# Patient Record
Sex: Female | Born: 1950
Health system: Southern US, Community
[De-identification: ages and names within clinical notes are randomized; demographics above are authoritative.]

## PROBLEM LIST (undated history)

## (undated) DIAGNOSIS — J40 Bronchitis, not specified as acute or chronic: Secondary | ICD-10-CM

## (undated) DIAGNOSIS — E785 Hyperlipidemia, unspecified: Secondary | ICD-10-CM

## (undated) DIAGNOSIS — N189 Chronic kidney disease, unspecified: Secondary | ICD-10-CM

## (undated) DIAGNOSIS — I10 Essential (primary) hypertension: Secondary | ICD-10-CM

## (undated) DIAGNOSIS — C801 Malignant (primary) neoplasm, unspecified: Secondary | ICD-10-CM

## (undated) HISTORY — PX: ABDOMINAL HYSTERECTOMY: SHX81

## (undated) HISTORY — DX: Hyperlipidemia, unspecified: E78.5

## (undated) HISTORY — DX: Essential (primary) hypertension: I10

## (undated) HISTORY — DX: Malignant (primary) neoplasm, unspecified: C80.1

## (undated) HISTORY — DX: Chronic kidney disease, unspecified: N18.9

---

## 1997-10-19 ENCOUNTER — Encounter (INDEPENDENT_AMBULATORY_CARE_PROVIDER_SITE_OTHER): Payer: Self-pay | Admitting: *Deleted

## 1997-10-19 LAB — CONVERTED CEMR LAB

## 1997-10-31 LAB — CONVERTED CEMR LAB: Pap Smear: NORMAL

## 1998-09-03 ENCOUNTER — Encounter: Admission: RE | Admit: 1998-09-03 | Discharge: 1998-09-03 | Payer: Self-pay | Admitting: Sports Medicine

## 1998-09-11 ENCOUNTER — Encounter: Admission: RE | Admit: 1998-09-11 | Discharge: 1998-09-11 | Payer: Self-pay | Admitting: Family Medicine

## 1998-12-24 ENCOUNTER — Encounter: Admission: RE | Admit: 1998-12-24 | Discharge: 1998-12-24 | Payer: Self-pay | Admitting: Sports Medicine

## 1999-02-05 ENCOUNTER — Encounter: Admission: RE | Admit: 1999-02-05 | Discharge: 1999-02-05 | Payer: Self-pay | Admitting: Family Medicine

## 1999-03-07 ENCOUNTER — Encounter: Admission: RE | Admit: 1999-03-07 | Discharge: 1999-03-07 | Payer: Self-pay | Admitting: Family Medicine

## 1999-04-08 ENCOUNTER — Encounter: Admission: RE | Admit: 1999-04-08 | Discharge: 1999-04-08 | Payer: Self-pay | Admitting: Family Medicine

## 1999-05-06 ENCOUNTER — Encounter: Admission: RE | Admit: 1999-05-06 | Discharge: 1999-05-06 | Payer: Self-pay | Admitting: Family Medicine

## 1999-07-03 ENCOUNTER — Encounter: Admission: RE | Admit: 1999-07-03 | Discharge: 1999-07-03 | Payer: Self-pay | Admitting: Family Medicine

## 1999-07-09 ENCOUNTER — Encounter: Admission: RE | Admit: 1999-07-09 | Discharge: 1999-10-07 | Payer: Self-pay | Admitting: *Deleted

## 1999-11-10 ENCOUNTER — Encounter: Admission: RE | Admit: 1999-11-10 | Discharge: 1999-11-10 | Payer: Self-pay | Admitting: Family Medicine

## 1999-11-29 ENCOUNTER — Emergency Department (HOSPITAL_COMMUNITY): Admission: EM | Admit: 1999-11-29 | Discharge: 1999-11-29 | Payer: Self-pay | Admitting: Emergency Medicine

## 2000-01-16 ENCOUNTER — Encounter: Admission: RE | Admit: 2000-01-16 | Discharge: 2000-01-16 | Payer: Self-pay | Admitting: Sports Medicine

## 2000-04-07 ENCOUNTER — Encounter: Admission: RE | Admit: 2000-04-07 | Discharge: 2000-04-07 | Payer: Self-pay | Admitting: Family Medicine

## 2000-07-27 ENCOUNTER — Encounter: Admission: RE | Admit: 2000-07-27 | Discharge: 2000-07-27 | Payer: Self-pay | Admitting: Family Medicine

## 2000-12-29 ENCOUNTER — Encounter: Admission: RE | Admit: 2000-12-29 | Discharge: 2000-12-29 | Payer: Self-pay | Admitting: Family Medicine

## 2001-01-27 ENCOUNTER — Encounter: Admission: RE | Admit: 2001-01-27 | Discharge: 2001-01-27 | Payer: Self-pay | Admitting: Family Medicine

## 2001-02-03 ENCOUNTER — Encounter: Admission: RE | Admit: 2001-02-03 | Discharge: 2001-02-03 | Payer: Self-pay | Admitting: Family Medicine

## 2001-03-14 ENCOUNTER — Encounter: Admission: RE | Admit: 2001-03-14 | Discharge: 2001-03-14 | Payer: Self-pay | Admitting: Family Medicine

## 2001-03-17 ENCOUNTER — Encounter: Admission: RE | Admit: 2001-03-17 | Discharge: 2001-03-17 | Payer: Self-pay | Admitting: Family Medicine

## 2001-04-08 ENCOUNTER — Encounter: Admission: RE | Admit: 2001-04-08 | Discharge: 2001-04-08 | Payer: Self-pay | Admitting: Family Medicine

## 2001-06-24 ENCOUNTER — Emergency Department (HOSPITAL_COMMUNITY): Admission: EM | Admit: 2001-06-24 | Discharge: 2001-06-24 | Payer: Self-pay | Admitting: Emergency Medicine

## 2001-07-22 ENCOUNTER — Encounter: Admission: RE | Admit: 2001-07-22 | Discharge: 2001-07-22 | Payer: Self-pay | Admitting: Family Medicine

## 2002-02-23 ENCOUNTER — Encounter: Admission: RE | Admit: 2002-02-23 | Discharge: 2002-02-23 | Payer: Self-pay | Admitting: Family Medicine

## 2002-02-28 ENCOUNTER — Emergency Department (HOSPITAL_COMMUNITY): Admission: EM | Admit: 2002-02-28 | Discharge: 2002-02-28 | Payer: Self-pay | Admitting: *Deleted

## 2002-02-28 ENCOUNTER — Encounter: Payer: Self-pay | Admitting: Emergency Medicine

## 2002-03-16 ENCOUNTER — Encounter: Admission: RE | Admit: 2002-03-16 | Discharge: 2002-03-16 | Payer: Self-pay | Admitting: Family Medicine

## 2002-06-27 ENCOUNTER — Encounter: Admission: RE | Admit: 2002-06-27 | Discharge: 2002-06-27 | Payer: Self-pay | Admitting: Family Medicine

## 2002-09-26 ENCOUNTER — Encounter: Admission: RE | Admit: 2002-09-26 | Discharge: 2002-09-26 | Payer: Self-pay | Admitting: Family Medicine

## 2002-09-29 ENCOUNTER — Emergency Department (HOSPITAL_COMMUNITY): Admission: EM | Admit: 2002-09-29 | Discharge: 2002-09-29 | Payer: Self-pay | Admitting: Emergency Medicine

## 2002-09-29 ENCOUNTER — Encounter: Payer: Self-pay | Admitting: Emergency Medicine

## 2002-11-02 ENCOUNTER — Encounter: Admission: RE | Admit: 2002-11-02 | Discharge: 2002-11-02 | Payer: Self-pay | Admitting: Family Medicine

## 2002-11-09 ENCOUNTER — Encounter: Admission: RE | Admit: 2002-11-09 | Discharge: 2002-11-09 | Payer: Self-pay | Admitting: Family Medicine

## 2003-02-12 ENCOUNTER — Encounter: Admission: RE | Admit: 2003-02-12 | Discharge: 2003-02-12 | Payer: Self-pay | Admitting: Family Medicine

## 2003-06-22 ENCOUNTER — Encounter: Admission: RE | Admit: 2003-06-22 | Discharge: 2003-06-22 | Payer: Self-pay | Admitting: Family Medicine

## 2003-07-18 ENCOUNTER — Encounter: Admission: RE | Admit: 2003-07-18 | Discharge: 2003-07-18 | Payer: Self-pay | Admitting: Sports Medicine

## 2003-07-18 ENCOUNTER — Encounter: Payer: Self-pay | Admitting: Sports Medicine

## 2004-01-15 ENCOUNTER — Encounter: Admission: RE | Admit: 2004-01-15 | Discharge: 2004-01-15 | Payer: Self-pay | Admitting: Family Medicine

## 2004-04-14 ENCOUNTER — Encounter: Admission: RE | Admit: 2004-04-14 | Discharge: 2004-04-14 | Payer: Self-pay | Admitting: Sports Medicine

## 2004-05-07 ENCOUNTER — Encounter: Admission: RE | Admit: 2004-05-07 | Discharge: 2004-05-07 | Payer: Self-pay | Admitting: Family Medicine

## 2004-05-20 ENCOUNTER — Encounter: Admission: RE | Admit: 2004-05-20 | Discharge: 2004-05-20 | Payer: Self-pay | Admitting: Sports Medicine

## 2004-08-18 ENCOUNTER — Ambulatory Visit: Payer: Self-pay | Admitting: Family Medicine

## 2004-09-08 ENCOUNTER — Encounter: Admission: RE | Admit: 2004-09-08 | Discharge: 2004-09-08 | Payer: Self-pay | Admitting: Radiology

## 2004-09-25 ENCOUNTER — Ambulatory Visit: Payer: Self-pay | Admitting: Family Medicine

## 2004-10-27 ENCOUNTER — Ambulatory Visit: Payer: Self-pay | Admitting: Family Medicine

## 2005-03-05 ENCOUNTER — Ambulatory Visit: Payer: Self-pay | Admitting: Family Medicine

## 2005-07-15 HISTORY — PX: COLONOSCOPY: SHX174

## 2005-07-31 ENCOUNTER — Ambulatory Visit: Payer: Self-pay | Admitting: Family Medicine

## 2005-08-27 ENCOUNTER — Ambulatory Visit: Payer: Self-pay | Admitting: Family Medicine

## 2005-09-22 ENCOUNTER — Emergency Department (HOSPITAL_COMMUNITY): Admission: EM | Admit: 2005-09-22 | Discharge: 2005-09-22 | Payer: Self-pay | Admitting: Emergency Medicine

## 2005-09-30 ENCOUNTER — Ambulatory Visit: Payer: Self-pay | Admitting: Family Medicine

## 2005-10-15 ENCOUNTER — Ambulatory Visit: Payer: Self-pay | Admitting: Sports Medicine

## 2005-10-23 ENCOUNTER — Emergency Department (HOSPITAL_COMMUNITY): Admission: EM | Admit: 2005-10-23 | Discharge: 2005-10-23 | Payer: Self-pay | Admitting: Emergency Medicine

## 2006-01-29 ENCOUNTER — Ambulatory Visit: Payer: Self-pay | Admitting: Family Medicine

## 2006-04-02 ENCOUNTER — Ambulatory Visit: Payer: Self-pay | Admitting: Family Medicine

## 2006-06-17 ENCOUNTER — Emergency Department (HOSPITAL_COMMUNITY): Admission: EM | Admit: 2006-06-17 | Discharge: 2006-06-17 | Payer: Self-pay | Admitting: Family Medicine

## 2006-07-30 ENCOUNTER — Ambulatory Visit: Payer: Self-pay | Admitting: Family Medicine

## 2006-09-16 ENCOUNTER — Encounter: Admission: RE | Admit: 2006-09-16 | Discharge: 2006-09-16 | Payer: Self-pay | Admitting: Family Medicine

## 2006-10-19 ENCOUNTER — Emergency Department (HOSPITAL_COMMUNITY): Admission: EM | Admit: 2006-10-19 | Discharge: 2006-10-19 | Payer: Self-pay | Admitting: Family Medicine

## 2006-12-17 ENCOUNTER — Encounter (INDEPENDENT_AMBULATORY_CARE_PROVIDER_SITE_OTHER): Payer: Self-pay | Admitting: *Deleted

## 2007-02-14 ENCOUNTER — Encounter: Payer: Self-pay | Admitting: Family Medicine

## 2007-02-14 ENCOUNTER — Ambulatory Visit: Payer: Self-pay | Admitting: Family Medicine

## 2007-02-14 DIAGNOSIS — E113299 Type 2 diabetes mellitus with mild nonproliferative diabetic retinopathy without macular edema, unspecified eye: Secondary | ICD-10-CM

## 2007-02-14 DIAGNOSIS — E1165 Type 2 diabetes mellitus with hyperglycemia: Secondary | ICD-10-CM

## 2007-02-14 DIAGNOSIS — E1169 Type 2 diabetes mellitus with other specified complication: Secondary | ICD-10-CM

## 2007-02-14 DIAGNOSIS — E785 Hyperlipidemia, unspecified: Secondary | ICD-10-CM

## 2007-02-14 DIAGNOSIS — E119 Type 2 diabetes mellitus without complications: Secondary | ICD-10-CM | POA: Insufficient documentation

## 2007-02-14 LAB — CONVERTED CEMR LAB
HDL: 42 mg/dL (ref >39)
Hgb A1c MFr Bld: 7.3 %
Triglycerides: 76 mg/dL (ref <150)

## 2007-02-15 ENCOUNTER — Encounter: Payer: Self-pay | Admitting: Family Medicine

## 2007-02-28 ENCOUNTER — Telehealth (INDEPENDENT_AMBULATORY_CARE_PROVIDER_SITE_OTHER): Payer: Self-pay | Admitting: *Deleted

## 2007-03-02 ENCOUNTER — Telehealth: Payer: Self-pay | Admitting: Family Medicine

## 2007-04-06 ENCOUNTER — Telehealth: Payer: Self-pay | Admitting: Family Medicine

## 2007-05-04 ENCOUNTER — Telehealth: Payer: Self-pay | Admitting: *Deleted

## 2007-05-05 ENCOUNTER — Ambulatory Visit: Payer: Self-pay | Admitting: Family Medicine

## 2007-05-05 LAB — CONVERTED CEMR LAB
Bilirubin Urine: NEGATIVE
Glucose, Urine, Semiquant: NEGATIVE
Nitrite: POSITIVE
Specific Gravity, Urine: 1.025

## 2007-07-08 ENCOUNTER — Telehealth (INDEPENDENT_AMBULATORY_CARE_PROVIDER_SITE_OTHER): Payer: Self-pay | Admitting: *Deleted

## 2007-07-08 ENCOUNTER — Ambulatory Visit: Payer: Self-pay | Admitting: Family Medicine

## 2007-07-12 ENCOUNTER — Encounter: Payer: Self-pay | Admitting: Family Medicine

## 2007-08-05 ENCOUNTER — Encounter: Payer: Self-pay | Admitting: Family Medicine

## 2007-08-05 ENCOUNTER — Ambulatory Visit: Payer: Self-pay | Admitting: Internal Medicine

## 2007-08-05 LAB — CONVERTED CEMR LAB
CO2: 22 meq/L (ref 19–32)
Calcium: 9.5 mg/dL (ref 8.4–10.5)
Creatinine, Ser: 1.02 mg/dL (ref 0.40–1.20)
Direct LDL: 123 mg/dL — ABNORMAL HIGH
Glucose, Bld: 109 mg/dL — ABNORMAL HIGH (ref 70–99)
Sodium: 140 meq/L (ref 135–145)

## 2007-12-21 ENCOUNTER — Telehealth: Payer: Self-pay | Admitting: *Deleted

## 2007-12-22 ENCOUNTER — Ambulatory Visit: Payer: Self-pay | Admitting: Family Medicine

## 2008-01-17 ENCOUNTER — Ambulatory Visit: Payer: Self-pay | Admitting: Family Medicine

## 2008-02-17 ENCOUNTER — Encounter: Admission: RE | Admit: 2008-02-17 | Discharge: 2008-02-17 | Payer: Self-pay | Admitting: Family Medicine

## 2008-11-20 ENCOUNTER — Emergency Department (HOSPITAL_COMMUNITY): Admission: EM | Admit: 2008-11-20 | Discharge: 2008-11-20 | Payer: Self-pay | Admitting: Family Medicine

## 2008-11-26 ENCOUNTER — Telehealth: Payer: Self-pay | Admitting: *Deleted

## 2008-11-28 ENCOUNTER — Telehealth: Payer: Self-pay | Admitting: Family Medicine

## 2008-11-29 ENCOUNTER — Encounter: Payer: Self-pay | Admitting: Family Medicine

## 2008-11-29 ENCOUNTER — Ambulatory Visit: Payer: Self-pay | Admitting: Family Medicine

## 2008-12-07 ENCOUNTER — Encounter: Payer: Self-pay | Admitting: Family Medicine

## 2008-12-07 LAB — CONVERTED CEMR LAB
Alkaline Phosphatase: 137 units/L — ABNORMAL HIGH (ref 39–117)
BUN: 24 mg/dL — ABNORMAL HIGH (ref 6–23)
CO2: 21 meq/L (ref 19–32)
Creatinine, Ser: 0.89 mg/dL (ref 0.40–1.20)
Direct LDL: 135 mg/dL — ABNORMAL HIGH
Glucose, Bld: 399 mg/dL — ABNORMAL HIGH (ref 70–99)
Sodium: 137 meq/L (ref 135–145)
Total Bilirubin: 0.4 mg/dL (ref 0.3–1.2)
Total Protein: 7.8 g/dL (ref 6.0–8.3)

## 2009-03-01 ENCOUNTER — Telehealth: Payer: Self-pay | Admitting: Family Medicine

## 2009-03-01 ENCOUNTER — Ambulatory Visit: Payer: Self-pay | Admitting: Family Medicine

## 2009-05-10 ENCOUNTER — Ambulatory Visit: Payer: Self-pay | Admitting: Family Medicine

## 2009-05-10 LAB — CONVERTED CEMR LAB: Hgb A1c MFr Bld: 7.8 %

## 2009-07-19 ENCOUNTER — Encounter: Payer: Self-pay | Admitting: Family Medicine

## 2009-07-19 ENCOUNTER — Ambulatory Visit: Payer: Self-pay | Admitting: Family Medicine

## 2009-07-19 LAB — CONVERTED CEMR LAB
AST: 11 units/L (ref 0–37)
Albumin: 4 g/dL (ref 3.5–5.2)
BUN: 18 mg/dL (ref 6–23)
CO2: 23 meq/L (ref 19–32)
Calcium: 9.2 mg/dL (ref 8.4–10.5)
Chloride: 103 meq/L (ref 96–112)
Cholesterol: 138 mg/dL (ref 0–200)
Creatinine, Ser: 0.91 mg/dL (ref 0.40–1.20)
Glucose, Bld: 161 mg/dL — ABNORMAL HIGH (ref 70–99)
HCT: 32.1 % — ABNORMAL LOW (ref 36.0–46.0)
HDL: 42 mg/dL (ref >39)
Hemoglobin: 10.5 g/dL — ABNORMAL LOW (ref 12.0–15.0)
Potassium: 4.5 meq/L (ref 3.5–5.3)
RBC: 3.44 M/uL — ABNORMAL LOW (ref 3.87–5.11)
RDW: 13 % (ref 11.5–15.5)
Total CHOL/HDL Ratio: 3.3
WBC: 5.6 10*3/uL (ref 4.0–10.5)

## 2009-08-01 ENCOUNTER — Encounter: Admission: RE | Admit: 2009-08-01 | Discharge: 2009-08-01 | Payer: Self-pay | Admitting: Family Medicine

## 2009-09-18 ENCOUNTER — Telehealth: Payer: Self-pay | Admitting: Family Medicine

## 2009-09-18 ENCOUNTER — Ambulatory Visit: Payer: Self-pay | Admitting: Family Medicine

## 2009-09-21 ENCOUNTER — Telehealth: Payer: Self-pay | Admitting: Family Medicine

## 2009-09-23 ENCOUNTER — Encounter: Payer: Self-pay | Admitting: Family Medicine

## 2009-09-23 ENCOUNTER — Telehealth: Payer: Self-pay | Admitting: Family Medicine

## 2009-10-08 ENCOUNTER — Ambulatory Visit: Payer: Self-pay | Admitting: Family Medicine

## 2009-10-08 DIAGNOSIS — E663 Overweight: Secondary | ICD-10-CM | POA: Insufficient documentation

## 2009-10-08 LAB — CONVERTED CEMR LAB: Hgb A1c MFr Bld: 9.7 %

## 2009-10-09 ENCOUNTER — Telehealth: Payer: Self-pay | Admitting: *Deleted

## 2009-10-09 ENCOUNTER — Encounter: Payer: Self-pay | Admitting: Family Medicine

## 2009-10-23 ENCOUNTER — Encounter: Payer: Self-pay | Admitting: Family Medicine

## 2010-04-06 ENCOUNTER — Emergency Department (HOSPITAL_COMMUNITY): Admission: EM | Admit: 2010-04-06 | Discharge: 2010-04-06 | Payer: Self-pay | Admitting: Family Medicine

## 2010-08-15 ENCOUNTER — Encounter: Payer: Self-pay | Admitting: Pharmacist

## 2010-09-22 ENCOUNTER — Encounter: Admission: RE | Admit: 2010-09-22 | Discharge: 2010-09-22 | Payer: Self-pay | Admitting: Family Medicine

## 2010-09-22 ENCOUNTER — Encounter: Payer: Self-pay | Admitting: Family Medicine

## 2010-09-22 ENCOUNTER — Ambulatory Visit: Payer: Self-pay | Admitting: Family Medicine

## 2010-09-22 DIAGNOSIS — I10 Essential (primary) hypertension: Secondary | ICD-10-CM

## 2010-09-22 DIAGNOSIS — M79609 Pain in unspecified limb: Secondary | ICD-10-CM | POA: Insufficient documentation

## 2010-09-22 LAB — CONVERTED CEMR LAB
ALT: 13 U/L
AST: 16 U/L
Albumin: 4.7 g/dL
Alkaline Phosphatase: 169 U/L — ABNORMAL HIGH
BUN: 15 mg/dL
CO2: 22 meq/L
Calcium: 9.9 mg/dL
Chloride: 98 meq/L
Creatinine, Ser: 0.98 mg/dL
Glucose, Bld: 368 mg/dL — ABNORMAL HIGH
HCT: 39.2 %
Hemoglobin: 13.4 g/dL
MCHC: 34.2 g/dL
MCV: 88.5 fL
Platelets: 334 K/uL
Potassium: 4.3 meq/L
RBC: 4.43 M/uL
RDW: 12.3 %
Sodium: 134 meq/L — ABNORMAL LOW
Total Bilirubin: 0.5 mg/dL
Total Protein: 7.7 g/dL
WBC: 6.8 10*3/microliter

## 2010-10-28 ENCOUNTER — Telehealth: Payer: Self-pay | Admitting: Family Medicine

## 2010-11-12 ENCOUNTER — Ambulatory Visit: Admission: RE | Admit: 2010-11-12 | Discharge: 2010-11-12 | Payer: Self-pay | Source: Home / Self Care

## 2010-11-12 LAB — CONVERTED CEMR LAB
Cholesterol, target level: 200 mg/dL
HDL goal, serum: 40 mg/dL

## 2010-11-13 ENCOUNTER — Telehealth: Payer: Self-pay | Admitting: *Deleted

## 2010-11-18 ENCOUNTER — Other Ambulatory Visit: Payer: Self-pay | Admitting: Family Medicine

## 2010-11-18 ENCOUNTER — Encounter: Payer: Self-pay | Admitting: Family Medicine

## 2010-11-18 ENCOUNTER — Ambulatory Visit: Admission: RE | Admit: 2010-11-18 | Discharge: 2010-11-18 | Payer: Self-pay | Source: Home / Self Care

## 2010-11-18 DIAGNOSIS — Z1239 Encounter for other screening for malignant neoplasm of breast: Secondary | ICD-10-CM

## 2010-11-18 LAB — CONVERTED CEMR LAB
Cholesterol: 161 mg/dL (ref 0–200)
HDL: 48 mg/dL (ref >39)
LDL Cholesterol: 101 mg/dL — ABNORMAL HIGH (ref 0–99)
Total CHOL/HDL Ratio: 3.4
Triglycerides: 62 mg/dL (ref <150)
Triglycerides: 62 mg/dL (ref <150)
VLDL: 12 mg/dL (ref 0–40)

## 2010-11-20 NOTE — Miscellaneous (Signed)
Summary: Orders Update  Clinical Lists Changes  Problems: Added new problem of ENCOUNTER FOR LONG-TERM USE OF OTHER MEDICATIONS (ICD-V58.69) Orders: Added new Test order of B12-[facility] (82607-23330) - Signed Added new Test order of CBC-[facility] (85027) - Signed Ok per Dr. Cat Ta 

## 2010-11-20 NOTE — Consult Note (Signed)
[facility]: [facility]   Imported By: Clydell Hakim 10/24/2009 14:01:39  _____________________________________________________________________  External Attachment:    Type:   Image     Comment:   External Document

## 2010-11-20 NOTE — Assessment & Plan Note (Signed)
Summary: hurt foot,df   Vital Signs:  Patient profile:   60 year old female Weight:      158.7 pounds BMI:     24.58 Pulse rate:   91 / minute BP sitting:   146 / 92  (right arm)  Vitals Entered By: Renato Battles slade,cma CC: left foot injury on thanksgiving. pain with pressure up to 8/10.  Is Patient Diabetic? Yes Pain Assessment Patient in pain? no        Primary Care Provider:  Grey Schlauch MD  CC:  left foot injury on thanksgiving. pain with pressure up to 8/10. Marland Kitchen  History of Present Illness: 60 y/o F here for L foot pain.  Pt was last seen at [facility] 09/2009.    Pt walked into table leg on Thanksgiving (1 1/2 wks ago):  +tenderness, able to walk on it but there is tenderness with pressure.  Walking with limp.  ?swelling when this first occured.  She works as a [employer] Services operational officer, so does not have to stand on her feet or do a lot of walking at her job. No dypnea, no calf swelling/redness, no fever/chills  DIABETES: Pt has not taken her meds (metformin, Glipizide, actos) for a while (months) HLD: Pt cannot remember when she last took Simvastatin HTN: She has not been taking lisinopril in months    Habits & Providers  Alcohol-Tobacco-Diet     Tobacco Status: never  Current Medications (verified): 1)  Bayer Childrens Aspirin 81 Mg Chew (Aspirin) .... Take 1 Tablet By Mouth Once A Day 2)  Glipizide 10 Mg Tabs (Glipizide) .... Take 1 Tablet By Mouth Before Breakfast and Before Dinner 3)  Lisinopril 10 Mg Tabs (Lisinopril) .... Take 1 Tablet By Mouth Every Morning, 4)  Simvastatin 5 Mg Tabs (Simvastatin) .... Take 1 Tablet By Mouth Once A Day 5)  Metformin Hcl 1000 Mg Tabs (Metformin Hcl) .Marland Kitchen.. 1 Tab By Mouth Two Times A Day For Diabetes 6)  Actos 15 Mg Tabs (Pioglitazone Hcl) .Marland Kitchen.. 1 Tab By Mouth Daily For Diabetes  Allergies (verified): 1)  Lipitor  Physical Exam  General:  Well-developed,well-nourished,in no acute distress; alert,appropriate and cooperative throughout examination. vitals  reviewed.  Pulses:  +2 bitaterally  Extremities:  Left foot: mild tenderness on left 2nd and 3rd toes.  mild swelling on 2nd and 3rd toes.  mobility intact.  sensation intact.  no ulcers/sores Skin:  Intact without suspicious lesions or rashes   Impression & Recommendations:  Problem # 1:  FOOT PAIN, LEFT (ICD-729.5) Assessment New Will get xray as she still has tenderness and swelling in the 2nd and 3rd toe.  There is tenderness with palpation.  Can take tyelnol/ibuprofen for pain.   Orders: Radiology other (Radiology Other) [facility]- Est Level  3 (11914)  Problem # 2:  DIABETES MELLITUS, UNCONTROLLED, WITH COMPLICATIONS (ICD-250.92) Assessment: Deteriorated Pt non compliant, has not taken meds in months.  Has not been to clinic since 09/2009.  I refilled Metformin and glipizide x 1 month.  Pt to rtc to see me to discuss chronic medical conditions.  Will check A1C and renal function today.     Her updated medication list for this problem includes:    Bayer Childrens Aspirin 81 Mg Chew (Aspirin) .Marland Kitchen... Take 1 tablet by mouth once a day    Glipizide 10 Mg Tabs (Glipizide) .Marland Kitchen... Take 1 tablet by mouth before breakfast and before dinner    Lisinopril 10 Mg Tabs (Lisinopril) .Marland Kitchen... Take 1 tablet by mouth every morning,  Metformin Hcl 1000 Mg Tabs (Metformin hcl) .Marland Kitchen... 1 tab by mouth two times a day for diabetes    Actos 15 Mg Tabs (Pioglitazone hcl) .Marland Kitchen... 1 tab by mouth daily for diabetes  Orders: A1C-[facility] (16109) Comp Met-[facility] ([id number]) CBC-[facility] (91478)  Problem # 3:  HYPERLIPIDEMIA (ICD-272.4) Assessment: Comment Only Noncompliant.  Needs to check FLP.  Refilled statin x 1 month.   Her updated medication list for this problem includes:    Pravachol 20 Mg Tabs (Pravastatin sodium) .Marland Kitchen... 1 tab by mouth at bedtime for cholesterol (take instead of simvastatin)  Orders: Comp Met-[facility] ([id number])  Problem # 4:  HYPERTENSION (ICD-401.9) Assessment: Comment Only BP elevated, likely  noncompliance.  Will refill med x 1 month, then rtc.  Her updated medication list for this problem includes:    Lisinopril 10 Mg Tabs (Lisinopril) .Marland Kitchen... Take 1 tablet by mouth every morning,  Complete Medication List: 1)  Bayer Childrens Aspirin 81 Mg Chew (Aspirin) .... Take 1 tablet by mouth once a day 2)  Glipizide 10 Mg Tabs (Glipizide) .... Take 1 tablet by mouth before breakfast and before dinner 3)  Lisinopril 10 Mg Tabs (Lisinopril) .... Take 1 tablet by mouth every morning, 4)  Pravachol 20 Mg Tabs (Pravastatin sodium) .Marland Kitchen.. 1 tab by mouth at bedtime for cholesterol (take instead of simvastatin) 5)  Metformin Hcl 1000 Mg Tabs (Metformin hcl) .Marland Kitchen.. 1 tab by mouth two times a day for diabetes 6)  Actos 15 Mg Tabs (Pioglitazone hcl) .Marland Kitchen.. 1 tab by mouth daily for diabetes  Patient Instructions: 1)  Please schedule a follow-up appointment in 1 month Diabetes, HTN. 2)  Go get xray today.  3)  Please take all your medications.   Prescriptions: PRAVACHOL 20 MG TABS (PRAVASTATIN SODIUM) 1 tab by mouth at bedtime for cholesterol (take instead of Simvastatin)  #30 x 0   Entered and Authorized by:   Angeline Slim MD   Signed by:   Angeline Slim MD on 09/22/2010   Method used:   Electronically to        [facility] [phone]* (retail)       [address]       [place], [place]  [address]       Ph: [phone]       Fax: [phone]   RxID:   [id number] METFORMIN HCL 1000 MG TABS (METFORMIN HCL) 1 tab by mouth two times a day for diabetes  #66 x 0   Entered and Authorized by:   Angeline Slim MD   Signed by:   Angeline Slim MD on 09/22/2010   Method used:   Electronically to        [facility] [phone]* (retail)       [address]       [place], [place]  [address]       Ph: [phone]       Fax: [phone]   RxID:   [id number] SIMVASTATIN 5 MG TABS (SIMVASTATIN) Take 1 tablet by mouth once a day  #34 x 0   Entered and Authorized by:   Angeline Slim MD   Signed by:   Angeline Slim MD on 09/22/2010    Method used:   Electronically to        [facility] [phone]* (retail)       [address]       [place], [place]  [address]       Ph: [phone]  Fax: [phone]   RxID:   [id number] LISINOPRIL 10 MG TABS (LISINOPRIL) Take 1 tablet by mouth every morning,  #34 x 0   Entered and Authorized by:   Angeline Slim MD   Signed by:   Angeline Slim MD on 09/22/2010   Method used:   Electronically to        [facility] [phone]* (retail)       [address]       [place], [place]  [address]       Ph: [phone]       Fax: [phone]   RxID:   [id number] GLIPIZIDE 10 MG TABS (GLIPIZIDE) Take 1 tablet by mouth before breakfast AND before dinner  #60 Tablet x 0   Entered and Authorized by:   Angeline Slim MD   Signed by:   Angeline Slim MD on 09/22/2010   Method used:   Electronically to        [facility] [phone]* (retail)       [address]       [place], [place]  [address]       Ph: [phone]       Fax: [phone]   RxID:   [id number]    Orders Added: 1)  A1C-[facility] [83036] 2)  Comp Met-[facility] [[id number]] 3)  CBC-[facility] [85027] 4)  Radiology other [Radiology Other] 5)  [facility]- Est Level  3 [61607]  Appended Document: A1c  13.1 %    Lab Visit  Laboratory Results   Blood Tests   Date/Time Received: September 22, 2010 11:41 AM  Date/Time Reported: September 22, 2010 2:50 PM   HGBA1C: 13.1%   (Normal Range: Non-Diabetic - 3-6%   Control Diabetic - 6-8%)  Comments: ...............test performed by......Marland KitchenBonnie A. [place], MLS (ASCP)cm    Orders Today:

## 2010-11-20 NOTE — Assessment & Plan Note (Signed)
Summary: HTN, DM, HLD   Vital Signs:  Patient profile:   60 year old female Weight:      165.8 pounds BMI:     25.68 Temp:     98.1 degrees F Pulse rate:   87 / minute BP sitting:   128 / 80  Vitals Entered By: Golden Circle RN (November 12, 2010 3:10 PM) CC: meds review & refill, Hypertension Management, Lipid Management Is Patient Diabetic? Yes Did you bring your meter with you today? No Pain Assessment Patient in pain? no      Nutritional Status Detail does not check cbgs. lost machine  Have you ever been in a relationship where you felt threatened, hurt or afraid?years ago   Does patient need assistance? Functional Status Self care Ambulation Normal   Primary Care Provider:  Arlyce Circle MD  CC:  meds review & refill, Hypertension Management, and Lipid Management.  History of Present Illness: 60 y/o F is here for HTN, DM, HLD.  When pt was seen one month ago she had not taken her meds in many months.  Pt denies that cost was an issue, she just stopped taking her medicine because she didn't feel like taking them anymore.  HYPERTENSION Disease Monitoring Blood pressure range: 140s/90s Medications: Lisinopril 10mg   Compliance: has compliance prob in the past  , but has taken med consistently for 1 month Lightheadedness: no Edema: no  Chest pain: no Dyspnea:no  Synocpe: no  Palpitatons:no Prevention Exercise: she walks a lot due to daily routine   Salt restriction:no   DIABETES Meds: Glipizide 10mg  two times a day, metformin 1000mg  bid  Compliance: compliant for the last month, but did not take meds for many months prior Diet: pt admits to not having healthy eating habits.  She has gained 7 lbs since last month Lightheadedness:no    Dizziness:no    Confusion :no   Shakiness:no   Abd  pain:no   Nausea:no    Vomiting:no       HYPERLIPIDEMIA: Med: Pravachol 20mg   Compliance: yes.  but has not taken meds for a long tme in the past. Prob taking med:no Muscle  aches:no Abd  pain:no   Hypertension History:      She denies headache, chest pain, palpitations, dyspnea with exertion, peripheral edema, visual symptoms, and syncope.  She notes no problems with any antihypertensive medication side effects.        Positive major cardiovascular risk factors include female age 59 years old or older, diabetes, hyperlipidemia, and hypertension.  Negative major cardiovascular risk factors include non-tobacco-user status.        Further assessment for target organ damage reveals no history of ASHD, cardiac end-organ damage (CHF/LVH), stroke/TIA, peripheral vascular disease, renal insufficiency, or hypertensive retinopathy.    Lipid Management History:      Positive NCEP/ATP III risk factors include female age 59 years old or older, diabetes, and hypertension.  Negative NCEP/ATP III risk factors include no history of early menopause without estrogen hormone replacement, non-tobacco-user status, no ASHD (atherosclerotic heart disease), no prior stroke/TIA, no peripheral vascular disease, and no history of aortic aneurysm.        Adjunctive measures started by the patient include aerobic exercise, ASA, limit alcohol consumpton, and weight reduction.       Habits & Providers  Alcohol-Tobacco-Diet     Alcohol drinks/day: 0     Tobacco Status: never  Exercise-Depression-Behavior     Drug Use: never     Seat Belt Use: always  Current Medications (verified): 1)  Bayer Childrens Aspirin 81 Mg Chew (Aspirin) .... Take 1 Tablet By Mouth Once A Day 2)  Glipizide 10 Mg Tabs (Glipizide) .... Take 1 Tablet By Mouth Before Breakfast and Before Dinner 3)  Lisinopril 10 Mg Tabs (Lisinopril) .... Take 1 Tablet By Mouth Every Morning, 4)  Pravachol 20 Mg Tabs (Pravastatin Sodium) .Marland Kitchen.. 1 Tab By Mouth At Bedtime For Cholesterol (Take Instead of Simvastatin) 5)  Metformin Hcl 1000 Mg Tabs (Metformin Hcl) .Marland Kitchen.. 1 Tab By Mouth Two Times A Day For Diabetes  Allergies  (verified): 1)  Lipitor  Past History:  Past Medical History: Reviewed history from 10/08/2009 and no changes required. DM HLD Bronchitis TAH due to uterine fibroids  Past Surgical History: Reviewed history from 10/08/2009 and no changes required. Bilateral tubal ligation, 1979 Partial TAH, no BSO secondary to uterine fibroids   Family History: Reviewed history from 05/10/2009 and no changes required. Father: DM2 Mother: HTN  Social History: Drug Use:  never Risk analyst Use:  always  Review of Systems       per hpi   Physical Exam  General:  Well-developed,well-nourished,in no acute distress; alert,appropriate and cooperative throughout examination. Vitals reviewed.  Neck:  No deformities, masses, or tenderness noted. Lungs:  Normal respiratory effort, chest expands symmetrically. Lungs are clear to auscultation, no crackles or wheezes. Heart:  Normal rate and regular rhythm. S1 and S2 normal without gallop, murmur, click, rub or other extra sounds.  No bruit.  No jvd.  Abdomen:  Bowel sounds positive,abdomen soft and non-tender without masses, organomegaly or hernias noted. Pulses:  R posterior tibial normal and L posterior tibial normal.   Extremities:  No clubbing, cyanosis, edema, or deformity noted with normal full range of motion of all joints.   Neurologic:  No cranial nerve deficits noted. Station and gait are normal. Plantar reflexes are down-going bilaterally. DTRs are symmetrical throughout. Sensory, motor and coordinative functions appear intact. Cervical Nodes:  No lymphadenopathy noted   Impression & Recommendations:  Problem # 1:  HYPERTENSION (ICD-401.9) Assessment Improved BP 120/80 which is at goal and less than 140/90 from last month.  Will continue on Lisinopril 10mg  daily.  Renal function and electrolytes wnl.    Her updated medication list for this problem includes:    Lisinopril 10 Mg Tabs (Lisinopril) .Marland Kitchen... Take 1 tablet by mouth every  morning,  Orders: [facility]- Est  Level 4 (56213)  Problem # 2:  DIABETES MELLITUS, UNCONTROLLED, WITH COMPLICATIONS (ICD-250.92) A1C was 9.7 in 09/2009 vs 13.1  in 09/2010.  Pt has been taking metformin 1000mg  two times a day and glipizide 10mg  two times a day x 1 month.  Will continue on this regimen.  Pt has not taken Actos is months and this medicine was not resumed.     The following medications were removed from the medication list:    Actos 15 Mg Tabs (Pioglitazone hcl) .Marland Kitchen... 1 tab by mouth daily for diabetes Her updated medication list for this problem includes:    Bayer Childrens Aspirin 81 Mg Chew (Aspirin) .Marland Kitchen... Take 1 tablet by mouth once a day    Glipizide 10 Mg Tabs (Glipizide) .Marland Kitchen... Take 1 tablet by mouth before breakfast and before dinner    Lisinopril 10 Mg Tabs (Lisinopril) .Marland Kitchen... Take 1 tablet by mouth every morning,    Metformin Hcl 1000 Mg Tabs (Metformin hcl) .Marland Kitchen... 1 tab by mouth two times a day for diabetes  Orders: [facility]- Est  Level 4 (08657)  Problem # 3:  HYPERLIPIDEMIA (ICD-272.4) Assessment: Unchanged Pt to rtc for FLP.  Last FLP from 07/2009 showed LFL 83, HDL  42, Trig  66 with LDL at goal.  Pt stopped taking pravastatin for many months and only resumed last week.  Will continue and will check direct LDL in 2-5 months.    Her updated medication list for this problem includes:    Pravachol 20 Mg Tabs (Pravastatin sodium) .Marland Kitchen... 1 tab by mouth at bedtime for cholesterol (take instead of simvastatin)  Orders: [facility]- Est  Level 4 (99214)Future Orders: Lipid-[facility] ([id number]) ... 10/30/2011  Problem # 4:  Preventive Health Care (ICD-V70.0) Assessment: Comment Only Advised pt to get MMG.   Complete Medication List: 1)  Bayer Childrens Aspirin 81 Mg Chew (Aspirin) .... Take 1 tablet by mouth once a day 2)  Glipizide 10 Mg Tabs (Glipizide) .... Take 1 tablet by mouth before breakfast and before dinner 3)  Lisinopril 10 Mg Tabs (Lisinopril) .... Take 1 tablet by mouth  every morning, 4)  Pravachol 20 Mg Tabs (Pravastatin sodium) .Marland Kitchen.. 1 tab by mouth at bedtime for cholesterol (take instead of simvastatin) 5)  Metformin Hcl 1000 Mg Tabs (Metformin hcl) .Marland Kitchen.. 1 tab by mouth two times a day for diabetes  Other Orders: Mammogram (Screening) (Mammo)  Hypertension Assessment/Plan:      The patient's hypertensive risk group is category C: Target organ damage and/or diabetes.  Her calculated 10 year risk of coronary heart disease is 15 %.  Today's blood pressure is 128/80.  Her blood pressure goal is < 130/80.  Lipid Assessment/Plan:      Based on NCEP/ATP III, the patient's risk factor category is "history of diabetes".  The patient's lipid goals are as follows: Total cholesterol goal is 200; LDL cholesterol goal is 100; HDL cholesterol goal is 40; Triglyceride goal is 150.    Patient Instructions: 1)  Please schedule a follow-up appointment in 1 month for HTN, DM.  2)  Please make appt for cholesterol check.  This has to be fasting, so no food or drink after midnight the night before appointment. 3)  Take all your medications as directed. 4)  Schedule your mammogram.  5)  Take an Aspirin every day.    Orders Added: 1)  Mammogram (Screening) [Mammo] 2)  Lipid-[facility] [80061-22930] 3)  [facility]- Est  Level 4 [09811]       Prevention & Chronic Care Immunizations   Influenza vaccine: given  (08/06/2007)   Influenza vaccine due: 08/2008    Tetanus booster: 07/20/2007: given   Tetanus booster due: 07/2017    Pneumococcal vaccine: Not documented  Colorectal Screening   Hemoccult: Not documented   Hemoccult due: Not Indicated    Colonoscopy: Done.  (06/19/2005)   Colonoscopy due: 06/20/2015  Other Screening   Pap smear: normal  (10/31/1997)   Pap smear action/deferral: Not indicated S/P hysterectomy  (10/08/2009)   Pap smear due: Not Indicated    Mammogram: ASSESSMENT: Negative - BI-RADS 1^MM DIGITAL SCREENING  (08/01/2009)   Mammogram action/deferral:  Ordered  (11/12/2010)   Mammogram due: 02/2009   Smoking status: never  (11/12/2010)  Diabetes Mellitus   HgbA1C: 13.1  (09/22/2010)   Hemoglobin A1C due: 11/05/2007    Eye exam: Pt seen at [facility] [address] [place], [place] [address] fax [phone] phone [phone]  Diabetic retinopathy in left eye.  Decreased visual fields.  May need retinal referral.  (08/09/2009)   Diabetic eye exam action/deferral: Ophthalmology referral  (05/10/2009)  Eye exam due: 02/2010    Foot exam: yes  (10/08/2009)   High risk foot: Not documented   Foot care education: Not documented   Foot exam due: 08/04/2008    Urine microalbumin/creatinine ratio: Not documented   Urine microalbumin action/deferral: Ordered   Urine microalbumin/cr due: 01/16/2009    Diabetes flowsheet reviewed?: Yes   Progress toward A1C goal: Unchanged  Lipids   Total Cholesterol: 138  (07/19/2009)   LDL: 83  (07/19/2009)   LDL Direct: 135  (11/29/2008)   HDL: 42  (07/19/2009)   Triglycerides: 66  (07/19/2009)    SGOT (AST): 16  (09/22/2010)   SGPT (ALT): 13  (09/22/2010)   Alkaline phosphatase: 169  (09/22/2010)   Total bilirubin: 0.5  (09/22/2010)    Lipid flowsheet reviewed?: Yes   Progress toward LDL goal: Unchanged  Hypertension   Last Blood Pressure: 128 / 80  (11/12/2010)   Serum creatinine: 0.98  (09/22/2010)   Serum potassium 4.3  (09/22/2010)    Hypertension flowsheet reviewed?: Yes   Progress toward BP goal: At goal  Self-Management Support :   Personal Goals (by the next clinic visit) :     Personal A1C goal: 7  (10/08/2009)     Personal blood pressure goal: 130/80  (10/08/2009)     Personal LDL goal: 100  (10/08/2009)    Diabetes self-management support: Written self-care plan  (10/08/2009)    Hypertension self-management support: Written self-care plan  (10/08/2009)    Lipid self-management support: Written self-care plan  (10/08/2009)    Nursing Instructions: Schedule  screening mammogram (see order)

## 2010-11-20 NOTE — Miscellaneous (Signed)
Summary: [facility]  Clinical Lists Changes Pt was seen by [facility] [phone] on 08/09/09.  Pt was to return for follow-up in 2 wks.  Pt did not.  I called and spoke to pt about this in 09/2009.  She stated that she had to pay $100 and she did not have the money at that time.  She states that she will call for an appointment.    I have asked [facility] to fax me the note on the ov but unfortunately I cannot make out the handwritten note.  I called [facility] today and am awaiting a call-back regarding findings of that ov. Fumi Guadron MD  October 23, 2009 8:43 AM  Observations: Added new observation of HTN PROGRESS: N/A (10/23/2009 8:35) Added new observation of HTN FSREVIEW: N/A (10/23/2009 8:35) Added new observation of DMEYEEXAMNXT: 02/2010 (10/23/2009 8:35) Added new observation of IMP OPH: Pt was seen again at in 09/2009 and exam showed mild improvement.  Pt to [facility] in 3  months.  Pt made aware of findings. (08/09/2009 12:43) Added new observation of DIAB EYE EX: Pt seen at [facility] [address] [place], [place] [address] fax [phone] phone [phone]  Diabetic retinopathy in left eye.  Decreased visual fields.  May need retinal referral. (08/09/2009 12:43)  Spoke with Tammy from Dr Laruth Bouchard office:  Pt has Diabetic retinopathy in left eye.  Pt has since come back on 10/09/09. Exam then showed slightly improved.  Pt to rtc in 3 mos re-eval.  May need retinal consult due to decreased visual fields. Pt was made aware of this.  Dr Laruth Bouchard office will follow her closely due to these findings. Sebastain Fishbaugh MD  October 23, 2009 12:40 PM     Ophthalmology Exam  Procedure date:  08/09/2009  Findings:      Pt seen at [facility] [address] [place], [place] [address] fax [phone] phone [phone]  Diabetic retinopathy in left eye.  Decreased visual fields.  May need retinal referral.  Comments:      Pt was seen again at in 09/2009 and exam showed mild  improvement.  Pt to [facility] in 3  months.  Pt made aware of findings.  Procedures Next Due Date:    Ophthalmology Exam: 02/2010    Prevention & Chronic Care Immunizations   Influenza vaccine: given  (08/06/2007)   Influenza vaccine due: 08/2008    Tetanus booster: 07/20/2007: given   Tetanus booster due: 07/2017    Pneumococcal vaccine: Not documented  Colorectal Screening   Hemoccult: Not documented   Hemoccult due: Not Indicated    Colonoscopy: Done.  (06/19/2005)   Colonoscopy due: 06/20/2015  Other Screening   Pap smear: normal  (10/31/1997)   Pap smear action/deferral: Not indicated S/P hysterectomy  (10/08/2009)   Pap smear due: Not Indicated    Mammogram: ASSESSMENT: Negative - BI-RADS 1^MM DIGITAL SCREENING  (08/01/2009)   Mammogram action/deferral: Ordered  (05/10/2009)   Mammogram due: 02/2009   Smoking status: never  (10/08/2009)  Diabetes Mellitus   HgbA1C: 9.7  (10/08/2009)   Hemoglobin A1C due: 11/05/2007    Eye exam: Pt seen at [facility] [address] [place], [place] [address] fax [phone] phone [phone]  Diabetic retinopathy in left eye.  Decreased visual fields.  May need retinal referral.  (08/09/2009)   Diabetic eye exam action/deferral: Ophthalmology referral  (05/10/2009)   Eye exam due: 02/2010    Foot exam: yes  (  10/08/2009)   High risk foot: Not documented   Foot care education: Not documented   Foot exam due: 08/04/2008    Urine microalbumin/creatinine ratio: Not documented   Urine microalbumin action/deferral: Ordered   Urine microalbumin/cr due: 01/16/2009  Lipids   Total Cholesterol: 138  (07/19/2009)   LDL: 83  (07/19/2009)   LDL Direct: 135  (11/29/2008)   HDL: 42  (07/19/2009)   Triglycerides: 66  (07/19/2009)    SGOT (AST): 11  (07/19/2009)   SGPT (ALT): 11  (07/19/2009)   Alkaline phosphatase: 123  (07/19/2009)   Total bilirubin: 0.4  (07/19/2009)  Self-Management Support :   Personal Goals (by the next  clinic visit) :     Personal A1C goal: 7  (10/08/2009)     Personal blood pressure goal: 130/80  (10/08/2009)     Personal LDL goal: 100  (10/08/2009)    Diabetes self-management support: Written self-care plan  (10/08/2009)    Lipid self-management support: Written self-care plan  (10/08/2009)

## 2010-11-20 NOTE — Progress Notes (Signed)
Summary: Rx  Phone Note Refill Request Call back at Home Phone [phone]   pt asking for refills on all her meds, pt changing pharmacies, [facility] pt has appt on 1/25.   Initial call taken by: Knox Royalty,  October 28, 2010 3:58 PM    Prescriptions: ACTOS 15 MG TABS (PIOGLITAZONE HCL) 1 tab by mouth daily for diabetes  #34 x 0   Entered and Authorized by:   Angeline Slim MD   Signed by:   Angeline Slim MD on 10/28/2010   Method used:   Electronically to        [facility] [phone]* (retail)       [address]       [place], [place]  [address]       Ph: [phone]       Fax: [phone]   RxID:   [id number] METFORMIN HCL 1000 MG TABS (METFORMIN HCL) 1 tab by mouth two times a day for diabetes  #66 x 0   Entered and Authorized by:   Angeline Slim MD   Signed by:   Angeline Slim MD on 10/28/2010   Method used:   Electronically to        [facility] [phone]* (retail)       [address]       [place], [place]  [address]       Ph: [phone]       Fax: [phone]   RxID:   [id number] PRAVACHOL 20 MG TABS (PRAVASTATIN SODIUM) 1 tab by mouth at bedtime for cholesterol (take instead of Simvastatin)  #30 x 0   Entered and Authorized by:   Angeline Slim MD   Signed by:   Angeline Slim MD on 10/28/2010   Method used:   Electronically to        [facility] [phone]* (retail)       [address]       [place], [place]  [address]       Ph: [phone]       Fax: [phone]   RxID:   [id number] LISINOPRIL 10 MG TABS (LISINOPRIL) Take 1 tablet by mouth every morning,  #34 x 0   Entered and Authorized by:   Angeline Slim MD   Signed by:   Angeline Slim MD on 10/28/2010   Method used:   Electronically to        [facility] [phone]* (retail)       [address]       [place], [place]  [address]       Ph: [phone]       Fax: [phone]   RxID:   [id number] GLIPIZIDE 10 MG TABS (GLIPIZIDE) Take 1 tablet by mouth before breakfast AND before dinner  #60 Tablet x 0   Entered and  Authorized by:   Angeline Slim MD   Signed by:   Angeline Slim MD on 10/28/2010   Method used:   Electronically to        [facility] [phone]* (retail)       [address]       [place], [place]  [address]       Ph: [phone]       Fax: [phone]   RxID:   [id number]

## 2010-11-26 NOTE — Progress Notes (Signed)
Summary: blood type?/SEE MESSAGE/TS  Phone Note Call from Patient Call back at Home Phone [phone] Call back at Work Phone [phone] Call back at ext [phone]   Reason for Call: Talk to Nurse Summary of Call: pt wants to know if her chart shows her blood type? If not, can we do test here so she can find out? Initial call taken by: Knox Royalty,  November 13, 2010 4:03 PM  Follow-up for Phone Call        called pt. MAILBOX IS FULL...unable to leave message. PLEASE tell pt, that we do not have her blood type. We don't do blood typing, except for pregnancy. She can give blood to the [facility] and find out that way. Insurance usually don't cover blood typing, only for pregnancy. Follow-up by: Arlyss Repress CMA,,  November 14, 2010 12:12 PM

## 2010-11-27 ENCOUNTER — Encounter: Payer: Self-pay | Admitting: Family Medicine

## 2010-11-27 ENCOUNTER — Other Ambulatory Visit: Payer: Self-pay | Admitting: Family Medicine

## 2010-11-27 MED ORDER — LISINOPRIL 10 MG PO TABS: 10.0000 mg | ORAL_TABLET | ORAL | Status: DC

## 2010-11-27 MED ORDER — METFORMIN HCL 1000 MG PO TABS: 1000.0000 mg | ORAL_TABLET | Freq: Two times a day (BID) | ORAL | Status: DC

## 2010-11-27 MED ORDER — PRAVASTATIN SODIUM 20 MG PO TABS: 20.0000 mg | ORAL_TABLET | Freq: Every day | ORAL | Status: DC

## 2010-11-27 MED ORDER — GLIPIZIDE 10 MG PO TABS: 10.0000 mg | ORAL_TABLET | Freq: Two times a day (BID) | ORAL | Status: DC

## 2010-12-03 ENCOUNTER — Other Ambulatory Visit: Payer: Self-pay | Admitting: Family Medicine

## 2010-12-03 ENCOUNTER — Telehealth: Payer: Self-pay | Admitting: Family Medicine

## 2010-12-03 MED ORDER — METFORMIN HCL 1000 MG PO TABS: 1000.0000 mg | ORAL_TABLET | Freq: Two times a day (BID) | ORAL | Status: DC

## 2010-12-03 MED ORDER — PRAVASTATIN SODIUM 20 MG PO TABS: 20.0000 mg | ORAL_TABLET | Freq: Every day | ORAL | Status: DC

## 2010-12-03 MED ORDER — GLIPIZIDE 10 MG PO TABS: 10.0000 mg | ORAL_TABLET | Freq: Two times a day (BID) | ORAL | Status: DC

## 2010-12-03 NOTE — Telephone Encounter (Signed)
Refill meds

## 2010-12-09 ENCOUNTER — Ambulatory Visit
Admission: RE | Admit: 2010-12-09 | Discharge: 2010-12-09 | Disposition: A | Discharge status: Home / Self Care | Payer: BC Managed Care – PPO | Source: Ambulatory Visit | Attending: *Deleted | Admitting: *Deleted

## 2010-12-09 ENCOUNTER — Ambulatory Visit: Payer: Self-pay | Admitting: Family Medicine

## 2010-12-09 DIAGNOSIS — Z1239 Encounter for other screening for malignant neoplasm of breast: Secondary | ICD-10-CM

## 2011-01-05 LAB — POCT RAPID STREP A (OFFICE): Streptococcus, Group A Screen (Direct): NEGATIVE

## 2011-12-01 ENCOUNTER — Other Ambulatory Visit: Payer: Self-pay | Admitting: Family Medicine

## 2011-12-01 NOTE — Telephone Encounter (Signed)
Refill request

## 2011-12-08 ENCOUNTER — Telehealth: Payer: Self-pay | Admitting: Family Medicine

## 2011-12-08 MED ORDER — LISINOPRIL 10 MG PO TABS: 10.0000 mg | ORAL_TABLET | ORAL | Status: DC

## 2011-12-08 NOTE — Telephone Encounter (Signed)
I will send in 30 pills with no refills. She needs to have creatinine checked this has not been done since 2011. I'm unsure how long she's been on this dose of lisinopril but  recommend checking creatinine before any refills. Will defer this to PCP.

## 2011-12-08 NOTE — Telephone Encounter (Signed)
Please send refill for lisinopril to [facility] on [address].  Pt scheduled with provider next Wednesday.  Call patient at work at [phone] before 4:00 if any problem with request

## 2011-12-08 NOTE — Telephone Encounter (Signed)
PCP out of office until 12/14/11.  Will route refill request to Dr. Gwendolyn Grant.  Gaylene Brooks, RN

## 2011-12-16 ENCOUNTER — Ambulatory Visit (INDEPENDENT_AMBULATORY_CARE_PROVIDER_SITE_OTHER): Payer: BC Managed Care – PPO | Admitting: Family Medicine

## 2011-12-16 ENCOUNTER — Other Ambulatory Visit: Payer: Self-pay | Admitting: Family Medicine

## 2011-12-16 ENCOUNTER — Encounter: Payer: Self-pay | Admitting: Family Medicine

## 2011-12-16 VITALS — BP 106/71 | HR 90 | Temp 97.7°F | Ht 67.5 in | Wt 168.0 lb

## 2011-12-16 DIAGNOSIS — E1165 Type 2 diabetes mellitus with hyperglycemia: Secondary | ICD-10-CM

## 2011-12-16 DIAGNOSIS — E118 Type 2 diabetes mellitus with unspecified complications: Secondary | ICD-10-CM

## 2011-12-16 DIAGNOSIS — J069 Acute upper respiratory infection, unspecified: Secondary | ICD-10-CM

## 2011-12-16 LAB — POCT GLYCOSYLATED HEMOGLOBIN (HGB A1C): Hemoglobin A1C: 10.1

## 2011-12-16 NOTE — Telephone Encounter (Signed)
Refill request

## 2011-12-16 NOTE — Patient Instructions (Signed)
Viral syndrome: Motrin and/or  tylenol-- for sore throat and body ache Salt water gargle, chloraseptic- for sore throat Saline nasal spray  Afrin x 3 days mucinex 1200mg  by mouth 2 x day- for cough   Diabetes- Talk to insurance about glucometer.  Work on exercise plan.  Work on diet.  Let me know if you would like referral to diabetes education program.   Return if new or worse symptoms.   Return in 1 month for diabetes follow up.   Thanks, Ellin Mayhew, MD

## 2011-12-17 ENCOUNTER — Other Ambulatory Visit: Payer: Self-pay | Admitting: Family Medicine

## 2011-12-17 ENCOUNTER — Telehealth: Payer: Self-pay | Admitting: Family Medicine

## 2011-12-17 ENCOUNTER — Other Ambulatory Visit: Payer: Self-pay | Admitting: *Deleted

## 2011-12-17 MED ORDER — METFORMIN HCL 1000 MG PO TABS: 1000.0000 mg | ORAL_TABLET | Freq: Two times a day (BID) | ORAL | Status: DC

## 2011-12-17 NOTE — Telephone Encounter (Signed)
Pt is at [facility] [address] She is there to pick up her meds that Dr Edmonia James was supposed to call in yesterday.  She does not want to come back to get them later.

## 2011-12-17 NOTE — Telephone Encounter (Signed)
Spoke with patient and she needs metformin today. Rx called tp pharmacy. Will send message to Dr. Edmonia James to send in other meds needed.

## 2011-12-17 NOTE — Telephone Encounter (Signed)
Refill request

## 2011-12-17 NOTE — Telephone Encounter (Signed)
Patient states she needs metformin today . This rx sent to pharmacy. Advised patient will send message to Dr. Edmonia James about refilling all the other meds. She is agreeable to this.

## 2011-12-18 ENCOUNTER — Other Ambulatory Visit: Payer: Self-pay | Admitting: Family Medicine

## 2011-12-18 MED ORDER — METFORMIN HCL 1000 MG PO TABS: 1000.0000 mg | ORAL_TABLET | Freq: Two times a day (BID) | ORAL | Status: DC

## 2011-12-18 NOTE — Telephone Encounter (Signed)
Was not aware that pt needed refills.  Refills for medicines sent in today.

## 2011-12-24 DIAGNOSIS — J069 Acute upper respiratory infection, unspecified: Secondary | ICD-10-CM | POA: Insufficient documentation

## 2011-12-24 NOTE — Assessment & Plan Note (Signed)
Patient agrees to check blood sugar at home. We'll talk with insurance company to figure out which glucometer they will pay for and reimburse for strips.  Pt to continue oral mediation.  Refills sent.  A1C 10.1.  Will most likely need to increase or add medication.  Pt states that she would like to first work on exercise and diet and tracking blood glucose at home over the next month.  At 1 month f/up we will discuss medication changes-  Will base this on home BG monitor logbook. Pt encouraged to make a dental and eye exam appointment.

## 2011-12-24 NOTE — Assessment & Plan Note (Addendum)
Symptomatic treatment only at this time.  Pt to return if any new or worsening of symptoms.

## 2011-12-24 NOTE — Progress Notes (Signed)
  Subjective:    Patient ID: Amanda Shepherd, female    DOB: 06-30-1951, 61 y.o.   MRN: [id number]  HPI Diabetes: Patient states that she has not come through her off appointment for her diabetes. States she is taking her medications as directed. But does not take blood sugar at home. Is not up to date on dental work or eye exam. Does not check feet daily. States she is here to figure out what she can do to take better care for diabetes.  Cold symptoms: Patient reports stuffy nose, positive sore throat, off and on headache, eye soreness, positive nasal congestion, positive cough-productive,  x1 week. No nausea. No vomiting. No diarrhea.No fever. No dizziness. No syncope.  Past medical history, surgical history, social history,family history, meds, allergies all updated under appropriate tabs.  Review of Systems As per above.    Objective:   Physical Exam  Constitutional: She appears well-developed and well-nourished.  HENT:  Head: Normocephalic and atraumatic.       Also in nasal discharge, clear. Mild throat erythema.  Eyes: Pupils are equal, round, and reactive to light. Right eye exhibits no discharge. Left eye exhibits no discharge.  Neck: No thyromegaly present.  Cardiovascular: Normal rate, regular rhythm and normal heart sounds.   No murmur heard. Pulmonary/Chest: Effort normal. No respiratory distress. She has no wheezes.  Abdominal: Soft. She exhibits no distension.  Musculoskeletal: She exhibits no edema.  Neurological: She is alert.  Skin: No rash noted.  Psychiatric: She has a normal mood and affect.          Assessment & Plan:

## 2012-01-06 ENCOUNTER — Other Ambulatory Visit: Payer: Self-pay | Admitting: Family Medicine

## 2012-01-06 DIAGNOSIS — Z1231 Encounter for screening mammogram for malignant neoplasm of breast: Secondary | ICD-10-CM

## 2012-01-13 ENCOUNTER — Ambulatory Visit (INDEPENDENT_AMBULATORY_CARE_PROVIDER_SITE_OTHER): Payer: BC Managed Care – PPO | Admitting: Family Medicine

## 2012-01-13 ENCOUNTER — Encounter: Payer: Self-pay | Admitting: Family Medicine

## 2012-01-13 DIAGNOSIS — E785 Hyperlipidemia, unspecified: Secondary | ICD-10-CM

## 2012-01-13 DIAGNOSIS — E118 Type 2 diabetes mellitus with unspecified complications: Secondary | ICD-10-CM

## 2012-01-13 DIAGNOSIS — Z0000 Encounter for general adult medical examination without abnormal findings: Secondary | ICD-10-CM

## 2012-01-13 DIAGNOSIS — I10 Essential (primary) hypertension: Secondary | ICD-10-CM

## 2012-01-13 DIAGNOSIS — E663 Overweight: Secondary | ICD-10-CM

## 2012-01-13 DIAGNOSIS — E119 Type 2 diabetes mellitus without complications: Secondary | ICD-10-CM

## 2012-01-13 NOTE — Progress Notes (Signed)
  Subjective:    Patient ID: Amanda Shepherd, female    DOB: 01-12-1951, 61 y.o.   MRN: [id number]  HPI Diabetes followup: Patient reports that she has not purchased glucometer her strips. Forgot to call the insurance company to research which machine they would cover. States that she has tried to decrease portion sizes but still has poor diet. Does not eat fruits or rash was on a daily basis. Patient is not exercising but plans to join the [facility] tomorrow. Patient has scheduled an eye appointment. Has not yet scheduled dental appointment. Has scheduled mammography appointment. A1c 10.1 at last appointment. Patient states that she takes metformin as directed. Does miss some doses of her Glucotrol but does try to take it as often as possible. No foot ulcers. No skin problems. No signs of hypoglycemia.  Cholesterol: Patient is past due for cholesterol screening. Patient agrees to LDL screen. States she would not be will return due to her work schedule in the morning for fasting lipid panel at this time.  Taking pravastatin 20 mg daily. No muscle aches. No problems with pravastatin.  Weight management: Patient states that she would like to lose some weight. She is motivated to join a gym to help with these efforts. Also knows that exercise will help her lower blood sugar. Has lost 5 pounds since last appointment. No chest pain. No shortness of breath.  Review of Systems As per above.    Objective:   Physical Exam  Constitutional: She appears well-developed and well-nourished. No distress.  HENT:  Head: Normocephalic and atraumatic.  Cardiovascular: Normal rate, regular rhythm and normal heart sounds.   No murmur heard. Pulmonary/Chest: Effort normal. No respiratory distress. She has no wheezes.  Abdominal: Soft. She exhibits no distension.  Musculoskeletal: She exhibits no edema.       See diabetic foot exam.   Neurological: She is alert.  Skin: No rash noted.  Psychiatric: She has a normal mood  and affect.          Assessment & Plan:

## 2012-01-13 NOTE — Patient Instructions (Addendum)
Diabetes: Diet-3 meals per day- review the handout on the plate method Exercise- get membership at [facility]- 2 -3 x per week- 1-2 hours.  Medications- continue to take medications as directed Bring logbook with Blood glucose levels written to your next appointment. Arrange appointment with Dr. Gerilyn Pilgrim- nutritionist.  Return in 1 month for follow up.

## 2012-01-14 DIAGNOSIS — Z0000 Encounter for general adult medical examination without abnormal findings: Secondary | ICD-10-CM | POA: Insufficient documentation

## 2012-01-14 NOTE — Assessment & Plan Note (Signed)
Pt to go to eye appointment as scheduled.  Needs to arrange dental appointment.  Foot exam normal today.  Pt to check blood glucose.  Rx given for glucometer and strips for pt to purchase.  Pt to check 2 x per day.  Pt to return for f/up in 1 month for diabetes f/up.  Pt to bring BG logbook. Pt is not currently exercising.  Also reports that she has poor dietary intake.

## 2012-01-14 NOTE — Assessment & Plan Note (Signed)
bp well controlled. 

## 2012-01-14 NOTE — Assessment & Plan Note (Signed)
Need to review recommended health screenings at future appointment.

## 2012-01-14 NOTE — Assessment & Plan Note (Signed)
Will recheck LDL today.  Unable to get full lipid panel b/c patient unable to come in early in am for fasting lipid panel.  Pt currently taking pravastatin.

## 2012-01-14 NOTE — Assessment & Plan Note (Signed)
Discussed nutrition goals.  -- gave handout on plate method.  Encouraged pt to go to diabetes education program or to see Dr. Gerilyn Pilgrim for MNT.  She states that she wants to work on exercise and plate method during the next month.  Pt to return in 1 month for f/up.

## 2012-01-28 ENCOUNTER — Ambulatory Visit: Payer: BC Managed Care – PPO

## 2012-01-29 ENCOUNTER — Ambulatory Visit
Admission: RE | Admit: 2012-01-29 | Discharge: 2012-01-29 | Disposition: A | Discharge status: Home / Self Care | Payer: BC Managed Care – PPO | Source: Ambulatory Visit | Attending: Emergency Medicine | Admitting: Emergency Medicine

## 2012-01-29 DIAGNOSIS — Z1231 Encounter for screening mammogram for malignant neoplasm of breast: Secondary | ICD-10-CM

## 2012-02-03 ENCOUNTER — Other Ambulatory Visit: Payer: Self-pay | Admitting: Family Medicine

## 2012-02-03 ENCOUNTER — Telehealth: Payer: Self-pay | Admitting: Family Medicine

## 2012-02-03 MED ORDER — LISINOPRIL 10 MG PO TABS: 10.0000 mg | ORAL_TABLET | ORAL | Status: DC

## 2012-02-03 NOTE — Telephone Encounter (Signed)
Call to patient house to let her know I received refill request of lisinopril-  Will refill x 1 month to get her through to her next appointment.  At her next appt need to recheck renal function.  If wnl- can give another year of refills. If pt calls back can give message.  Called but no answer, no voicemail.

## 2012-04-26 ENCOUNTER — Other Ambulatory Visit: Payer: Self-pay | Admitting: *Deleted

## 2012-04-26 MED ORDER — LISINOPRIL 10 MG PO TABS: 10.0000 mg | ORAL_TABLET | ORAL | Status: DC

## 2012-06-01 ENCOUNTER — Encounter: Payer: Self-pay | Admitting: Family Medicine

## 2012-08-31 ENCOUNTER — Encounter: Payer: Self-pay | Admitting: Home Health Services

## 2013-01-04 ENCOUNTER — Encounter: Payer: Self-pay | Admitting: *Deleted

## 2013-01-09 ENCOUNTER — Other Ambulatory Visit: Payer: Self-pay | Admitting: *Deleted

## 2013-01-09 MED ORDER — METFORMIN HCL 1000 MG PO TABS: ORAL_TABLET | ORAL | Status: DC

## 2013-01-09 NOTE — Telephone Encounter (Signed)
Needs appt with PCP for further refills. Not seen in 1 year.

## 2013-02-20 ENCOUNTER — Other Ambulatory Visit: Payer: Self-pay | Admitting: Family Medicine

## 2013-03-06 ENCOUNTER — Ambulatory Visit (INDEPENDENT_AMBULATORY_CARE_PROVIDER_SITE_OTHER): Payer: BC Managed Care – PPO | Admitting: Family Medicine

## 2013-03-06 ENCOUNTER — Encounter: Payer: Self-pay | Admitting: Family Medicine

## 2013-03-06 VITALS — BP 133/82 | HR 88 | Ht 67.0 in | Wt 156.0 lb

## 2013-03-06 DIAGNOSIS — I10 Essential (primary) hypertension: Secondary | ICD-10-CM

## 2013-03-06 DIAGNOSIS — E785 Hyperlipidemia, unspecified: Secondary | ICD-10-CM

## 2013-03-06 DIAGNOSIS — E1165 Type 2 diabetes mellitus with hyperglycemia: Secondary | ICD-10-CM

## 2013-03-06 LAB — CBC
Hemoglobin: 12.5 g/dL (ref 12.0–15.0)
Platelets: 328 10*3/uL (ref 150–400)
RBC: 4.06 MIL/uL (ref 3.87–5.11)
WBC: 6.5 10*3/uL (ref 4.0–10.5)

## 2013-03-06 MED ORDER — METFORMIN HCL 1000 MG PO TABS: ORAL_TABLET | ORAL | Status: DC

## 2013-03-06 MED ORDER — PRAVASTATIN SODIUM 20 MG PO TABS: 20.0000 mg | ORAL_TABLET | Freq: Every day | ORAL | Status: DC

## 2013-03-06 MED ORDER — GLIPIZIDE 10 MG PO TABS: ORAL_TABLET | ORAL | Status: DC

## 2013-03-06 MED ORDER — LISINOPRIL 10 MG PO TABS: 10.0000 mg | ORAL_TABLET | ORAL | Status: DC

## 2013-03-06 NOTE — Progress Notes (Signed)
S: Pt comes in today for follow up.  Has not been seen in > 1 year.  DIABETES Home CBGs: doesn't check Meds: met 1000 BID, glipizide 10 BID Taking Meds: yes- ran out of metformin this AM, glipizide ran out at least 1 month ago # of doses missed per week: 1 Hypoglycemic episodes?: no Symptoms: Polyuria: no Polydipsia: no Parasthesias: no   Dizziness: no  Nausea:  no Vomiting:  no Last A1c:  Lab Results  Component Value Date   HGBA1C 10.7 03/06/2013     HYPERTENSION BP: 133/82 Meds: lisino 10, ASA 81 Taking meds: Yes; not taking ASA right now b/c she ran out    # of doses missed/week: 0 Symptoms: Headache: No Dizziness: No Vision changes: No SOB:  No Chest pain: No LE swelling: No Tobacco use: No   HYPERLIPIDEMIA  Meds: pravastatin 20 Muscle aches: no -- did not know she was supposed to be taking anything Last FLP or LDL:  Lab Results  Component Value Date   LDLCALC 101* 11/18/2010   LDLCALC 101* 11/18/2010   Lab Results  Component Value Date   CHOL 161 11/18/2010   CHOL 161 11/18/2010   HDL 48 11/18/2010   HDL 48 7/82/9562   LDLCALC 101* 11/18/2010   LDLCALC 101* 11/18/2010   LDLDIRECT 135* 11/29/2008   TRIG 62 11/18/2010   TRIG 62 11/18/2010   CHOLHDL 3.4 Ratio 11/18/2010   CHOLHDL 3.4 Ratio 11/18/2010   Weight:  Wt Readings from Last 3 Encounters:  03/06/13 156 lb (70.761 kg)  01/13/12 163 lb (73.936 kg)  12/16/11 168 lb (76.204 kg)     ROS: Per HPI  History  Smoking status  . Never Smoker   Smokeless tobacco  . Never Used    O:  Filed Vitals:   03/06/13 1536  BP: 133/82  Pulse: 88    Gen: NAD CV: RRR, no murmur Pulm: CTA bilat, no wheezes or crackles Ext: Warm, no edema   A/P: 62 y.o. female p/w HTN, DM, HLD -See problem list -f/u in 1 month

## 2013-03-06 NOTE — Assessment & Plan Note (Signed)
Pt has not been taking any statin-- thought she was supposed to stop.  Prava refilled, pt will start taking. LDL checked today.

## 2013-03-06 NOTE — Assessment & Plan Note (Signed)
A1c >10, pt out of glipizide for a while, glipizide 10 BID and metformin 1000 BID refilled. F/u 1 month with list of AM CBGs.

## 2013-03-06 NOTE — Patient Instructions (Addendum)
It was nice to meet you today.  Your diabetes number is higher than what we would like. Restart both your metformin and your glipizide.  Start checking your sugars first thing ever morning.  Your blood pressure looks ok.  No changes.  Pick up our baby aspirin.    I am sending in your pravastatin (this should have replaced the simvastatin).  Come back in 1 month to recheck everything.

## 2013-03-06 NOTE — Assessment & Plan Note (Signed)
Ok today on lisinopril 10, will continue. Encouraged pt to refill ASA.

## 2013-03-07 ENCOUNTER — Encounter: Payer: Self-pay | Admitting: Family Medicine

## 2013-03-07 LAB — COMPREHENSIVE METABOLIC PANEL
ALT: 14 U/L (ref 0–35)
Albumin: 4.6 g/dL (ref 3.5–5.2)
CO2: 28 mEq/L (ref 19–32)
Calcium: 10.1 mg/dL (ref 8.4–10.5)
Chloride: 102 mEq/L (ref 96–112)
Glucose, Bld: 150 mg/dL — ABNORMAL HIGH (ref 70–99)
Potassium: 4.3 mEq/L (ref 3.5–5.3)
Sodium: 138 mEq/L (ref 135–145)
Total Protein: 7.8 g/dL (ref 6.0–8.3)

## 2013-03-07 LAB — LDL CHOLESTEROL, DIRECT: Direct LDL: 135 mg/dL — ABNORMAL HIGH

## 2013-03-08 ENCOUNTER — Other Ambulatory Visit: Payer: Self-pay | Admitting: Family Medicine

## 2013-03-08 DIAGNOSIS — E1165 Type 2 diabetes mellitus with hyperglycemia: Secondary | ICD-10-CM

## 2013-03-08 MED ORDER — GLUCOSE BLOOD VI STRP: ORAL_STRIP | Status: DC

## 2013-03-08 MED ORDER — RELION ULTRA THIN LANCETS 30G MISC: Status: DC

## 2013-03-08 NOTE — Telephone Encounter (Signed)
Patient states she spoke to someone on the nurses line already regarding refill and that she will check with the pharmacy.  Terilyn Sano, Darlyne Russian, CMA

## 2013-03-08 NOTE — Telephone Encounter (Signed)
Pt needs testing strips for relion-confirm meter and lancets

## 2013-04-05 ENCOUNTER — Encounter: Payer: Self-pay | Admitting: Family Medicine

## 2013-04-05 ENCOUNTER — Ambulatory Visit (INDEPENDENT_AMBULATORY_CARE_PROVIDER_SITE_OTHER): Payer: BC Managed Care – PPO | Admitting: Family Medicine

## 2013-04-05 VITALS — BP 123/80 | HR 88 | Ht 67.0 in | Wt 157.0 lb

## 2013-04-05 DIAGNOSIS — E1165 Type 2 diabetes mellitus with hyperglycemia: Secondary | ICD-10-CM

## 2013-04-05 DIAGNOSIS — IMO0001 Reserved for inherently not codable concepts without codable children: Secondary | ICD-10-CM

## 2013-04-05 DIAGNOSIS — E785 Hyperlipidemia, unspecified: Secondary | ICD-10-CM

## 2013-04-05 DIAGNOSIS — I10 Essential (primary) hypertension: Secondary | ICD-10-CM

## 2013-04-05 MED ORDER — GLIPIZIDE 10 MG PO TABS: 20.0000 mg | ORAL_TABLET | Freq: Two times a day (BID) | ORAL | Status: DC

## 2013-04-05 NOTE — Progress Notes (Signed)
S: Pt comes in today for follow up.  HYPERTENSION BP: 123/80 Meds: lisino 10, ASA 81 Taking meds: Yes     # of doses missed/week: 1 Symptoms: Headache: No Dizziness: No Vision changes: No SOB:  No Chest pain: No LE swelling: No Tobacco use: No   DIABETES Home CBGs: fasting range 81-194; average is 160's Meds: metformin 1000 BID, glipizide 10 BID Taking Meds: yes # of doses missed per week: 2 Hypoglycemic episodes?: no Symptoms: Polyuria: no Polydipsia: no Parasthesias: no   Dizziness: no  Nausea:  no Vomiting:  no Last A1c: 10.7 02/2013   HYPERLIPIDEMIA  Meds: pravastatin 20 Muscle aches: no Last FLP or LDL:  Lab Results  Component Value Date   LDLCALC 101* 11/18/2010   LDLCALC 101* 11/18/2010   Lab Results  Component Value Date   CHOL 161 11/18/2010   CHOL 161 11/18/2010   HDL 48 11/18/2010   HDL 48 01/09/4009   LDLCALC 101* 11/18/2010   LDLCALC 101* 11/18/2010   LDLDIRECT 135* 03/06/2013   TRIG 62 11/18/2010   TRIG 62 11/18/2010   CHOLHDL 3.4 Ratio 11/18/2010   CHOLHDL 3.4 Ratio 11/18/2010    Weight:  Wt Readings from Last 3 Encounters:  04/05/13 157 lb (71.215 kg)  03/06/13 156 lb (70.761 kg)  01/13/12 163 lb (73.936 kg)     ROS: Per HPI  History  Smoking status  . Never Smoker   Smokeless tobacco  . Never Used    O:  Filed Vitals:   04/05/13 1539  BP: 123/80  Pulse: 88    Gen: NAD CV: RRR, no murmur Pulm: CTA bilat, no wheezes or crackles Ext: Warm, no edema   A/P: 62 y.o. female p/w HTN, HLD, DM -See problem list -f/u in 3 months

## 2013-04-05 NOTE — Assessment & Plan Note (Signed)
Pt restarted pravastatin, consider FLP in 6 months

## 2013-04-05 NOTE — Assessment & Plan Note (Signed)
Remains well controlled on lisinopril

## 2013-04-05 NOTE — Patient Instructions (Signed)
It was good to see you today.  I recommend that you meet with our health coach, Rosalita Chessman-- she is a great resource!  We are increasing your glipizide to 2 tablets 2 times per day.  Keep taking the metformin 2 times per day. Work on walking/exercise, eating better, and weight loss.  If your A1c is not improved in 3 months, we will need to seriously consider starting insulin.  Please come back in 3 months, sooner for any issues.  Keep track of your sugars at least 3 days per week.  At least check a fasting sugar in the morning.  If you think about it, you can check a sugar 2 hours after eating a meal.

## 2013-04-05 NOTE — Progress Notes (Signed)
Patient Identified Concern:  Dm control, weight loss Stage of Change Patient Is In:  Preparation, has been making changes for 1 month Patient Reported Barriers:  Soft drinks, sedentary lifestyle, pasta, non-continuous medical care Patient Reported Perceived Benefits:  Feeling better, weight loss, not having to start insulin Patient Reports Self-Efficacy:   Pt displays high self-efficacy based on past successes with controlling A1C. Behavior Change Supports:  Friends who walk with her Goals:  To exercise 4x this next week, continue with daily medications, monitor blood glucose at least 3x this week, monitor portions of carbohydrates (1/2 cup serving) Patient Education:  We talked about factors that influence diabetes such as exercise, diet, medication compliance.  Pt was able to teach back concepts and design specific personal goals around these factors.

## 2013-04-05 NOTE — Assessment & Plan Note (Addendum)
CBGs elevated, increase glipizide to 20 BID + met 1000 BID. Pt met with health coach today and will work on lifestyle changes.  Advised pt she will likely need insulin if A1c not significantly improved in 2-3 months.  Eye exam done today.

## 2013-04-13 ENCOUNTER — Telehealth: Payer: Self-pay | Admitting: Home Health Services

## 2013-04-13 NOTE — Telephone Encounter (Signed)
Spoke with Amanda Shepherd Pt reports feeling: good Pt reports taking medications yes Patient missed taking medications 1 days this week.   Pt is self monitoring their DM:no medication compliance: compliant all of the time, diabetic diet compliance: compliant most of the time Pt reports walking 2x a day (morning and night) 6 our of the 7 days this past week.  Is walking with sister.  Reports having increased daily vegetable intake  Last weeks goals:walk 4x, quit soda, take meds daily, check glucose 3x Pt was successful with last week's goals: yes, however pt did not check bg at all this week.  This weeks goals: walk 4x, check bg 3x, meds daily, no soda, limit carbs Pt's overall goal is: weight loss, dm managment

## 2013-04-28 ENCOUNTER — Telehealth: Payer: Self-pay | Admitting: Home Health Services

## 2013-04-28 NOTE — Telephone Encounter (Signed)
Spoke with Rose Pt reports feeling: good Pt reports taking medications yes Patient missed taking medications 0 days this week.   Pt is self monitoring their DM:no medication compliance: compliant all of the time, diabetic diet compliance: compliant most of the time, pt reports walking atleast 2x a day almost every day.    Last weeks goals:walk 4x, no soda, limit carbs, take meds Pt was successful with last week's goals: yes This weeks goals: walk 4x, no soda, take meds, limit carbs, take bg atleast 3x Pt's overall goal is: weight loss, dm managment

## 2013-05-17 ENCOUNTER — Telehealth: Payer: Self-pay | Admitting: Home Health Services

## 2013-05-17 NOTE — Telephone Encounter (Signed)
Spoke with Rose.  Pt reports feeling well.  Reports taking medications daily without any missed days.  Pt reports being sick last week so she wasn't able to walk.  She plans on starting to walk again today.  She feels like she has lost weight because she is in a smaller clothes size.  Pt reports not checking blood glucose.  Pt reports doing well with diet and only drinks soda occasionally now.   Pt's goals this week is to walk 4-5x, no soda, limit carbs, medication compliance.  Pt's overall goal is dm management.

## 2013-06-08 ENCOUNTER — Telehealth: Payer: Self-pay | Admitting: Home Health Services

## 2013-06-08 ENCOUNTER — Telehealth: Payer: Self-pay | Admitting: Family Medicine

## 2013-06-08 NOTE — Telephone Encounter (Signed)
Pt states that 3rd toe is sore but is doing better - no redness,streaking or heat, or open areas, no edema. Advised pt to use motrin for pain, elevate if needed - any signs or symptoms of infection to please call clinic - verbalized understanding. Wyatt Haste, RN-BSN

## 2013-06-08 NOTE — Telephone Encounter (Signed)
Pt called because she thought she might have broken her third toe. She does have movement in it but she is concerned that it might be out of place. She is diabetic and didn't know if she should come in or not. JW

## 2013-06-08 NOTE — Telephone Encounter (Signed)
Spoke with [place].  Pt reports taking medications as prescribed.  Has been walking but stopped the past few days because she was at church.  Pt reports doing well with diabetes but has not been monitoring blood glucose so was unable to report any values.  Pt reports that she has lost weight.  Informed pt that it was time for 3 mo dm follow up visits.  Pt said that once she resolved the issue with her toe she will schedule an appointment.  Pt's overall goal is dm management.

## 2013-06-27 ENCOUNTER — Other Ambulatory Visit: Payer: Self-pay

## 2013-06-27 DIAGNOSIS — Z1231 Encounter for screening mammogram for malignant neoplasm of breast: Secondary | ICD-10-CM

## 2013-06-29 ENCOUNTER — Ambulatory Visit
Admission: RE | Admit: 2013-06-29 | Discharge: 2013-06-29 | Disposition: A | Discharge status: Home / Self Care | Payer: BC Managed Care – PPO | Source: Ambulatory Visit

## 2013-06-29 DIAGNOSIS — Z1231 Encounter for screening mammogram for malignant neoplasm of breast: Secondary | ICD-10-CM

## 2013-07-20 ENCOUNTER — Encounter: Payer: Self-pay | Admitting: Home Health Services

## 2013-08-31 ENCOUNTER — Other Ambulatory Visit: Payer: Self-pay | Admitting: Family Medicine

## 2013-10-09 ENCOUNTER — Other Ambulatory Visit: Payer: Self-pay | Admitting: Family Medicine

## 2013-10-11 ENCOUNTER — Encounter (HOSPITAL_COMMUNITY): Payer: Self-pay | Admitting: Emergency Medicine

## 2013-10-11 ENCOUNTER — Emergency Department (HOSPITAL_COMMUNITY)
Admission: EM | Admit: 2013-10-11 | Discharge: 2013-10-11 | Disposition: A | Discharge status: Home / Self Care | Payer: BC Managed Care – PPO | Attending: Emergency Medicine | Admitting: Emergency Medicine

## 2013-10-11 DIAGNOSIS — E785 Hyperlipidemia, unspecified: Secondary | ICD-10-CM | POA: Insufficient documentation

## 2013-10-11 DIAGNOSIS — J069 Acute upper respiratory infection, unspecified: Secondary | ICD-10-CM

## 2013-10-11 DIAGNOSIS — IMO0001 Reserved for inherently not codable concepts without codable children: Secondary | ICD-10-CM | POA: Insufficient documentation

## 2013-10-11 DIAGNOSIS — I10 Essential (primary) hypertension: Secondary | ICD-10-CM | POA: Insufficient documentation

## 2013-10-11 DIAGNOSIS — IMO0002 Reserved for concepts with insufficient information to code with codable children: Secondary | ICD-10-CM | POA: Insufficient documentation

## 2013-10-11 DIAGNOSIS — Z79899 Other long term (current) drug therapy: Secondary | ICD-10-CM | POA: Insufficient documentation

## 2013-10-11 DIAGNOSIS — Z7982 Long term (current) use of aspirin: Secondary | ICD-10-CM | POA: Insufficient documentation

## 2013-10-11 DIAGNOSIS — J111 Influenza due to unidentified influenza virus with other respiratory manifestations: Secondary | ICD-10-CM | POA: Insufficient documentation

## 2013-10-11 DIAGNOSIS — R6889 Other general symptoms and signs: Secondary | ICD-10-CM

## 2013-10-11 DIAGNOSIS — R000 Tachycardia, unspecified: Secondary | ICD-10-CM | POA: Insufficient documentation

## 2013-10-11 DIAGNOSIS — E119 Type 2 diabetes mellitus without complications: Secondary | ICD-10-CM | POA: Insufficient documentation

## 2013-10-11 MED ORDER — FLUTICASONE PROPIONATE 50 MCG/ACT NA SUSP: 2.0000 | Freq: Every day | NASAL | Status: DC

## 2013-10-11 NOTE — ED Provider Notes (Signed)
Medical screening examination/treatment/procedure(s) were performed by non-physician practitioner and as supervising physician I was immediately available for consultation/collaboration.  EKG Interpretation   None         Dagmar Hait, MD 10/11/13 [phone]

## 2013-10-11 NOTE — ED Provider Notes (Signed)
CSN: [id number]     Arrival date & time 10/11/13  0740 History   First MD Initiated Contact with Patient 10/11/13 [phone]     Chief Complaint  Patient presents with  . Nasal Congestion  . Headache   (Consider location/radiation/quality/duration/timing/severity/associated sxs/prior Treatment) HPI Comments: Pt is a 62 y/o female with a PMHx of DM and HTN who presents to the ED complaining of congestion x 3 days. Pt states her nose if very congested, has pressure behind her sinuses, productive cough with green mucus, and body aches. Denies fever, nausea, vomiting, diarrhea, chest pain, wheezing, sob. No sick contacts. She has had her flu shot. Tried OTC allergy and sinus medication without relief.  Patient is a 62 y.o. female presenting with headaches. The history is provided by the patient.  Headache Associated symptoms: congestion, cough, myalgias and sinus pressure     Past Medical History  Diagnosis Date  . Diabetes mellitus   . Hyperlipidemia    Past Surgical History  Procedure Laterality Date  . Abdominal hysterectomy     Family History  Problem Relation Age of Onset  . Hypertension Mother    History  Substance Use Topics  . Smoking status: Never Smoker   . Smokeless tobacco: Never Used  . Alcohol Use: No   OB History   Grav Para Term Preterm Abortions TAB SAB Ect Mult Living                 Review of Systems  HENT: Positive for congestion, sinus pressure and sneezing.   Respiratory: Positive for cough.   Musculoskeletal: Positive for arthralgias and myalgias.  All other systems reviewed and are negative.    Allergies  Atorvastatin  Home Medications   Current Outpatient Rx  Name  Route  Sig  Dispense  Refill  . aspirin (ASPIRIN CHILDRENS) 81 MG chewable tablet   Oral   Chew 81 mg by mouth daily.           . fluticasone (FLONASE) 50 MCG/ACT nasal spray   Each Nare   Place 2 sprays into both nostrils daily.   16 g   0   . glipiZIDE (GLUCOTROL) 10 MG  tablet   Oral   Take 2 tablets (20 mg total) by mouth 2 (two) times daily before a meal.   120 tablet   4   . glucose blood (RELION CONFIRM/MICRO TEST) test strip      Use for checking blood sugars three to four times a day.  Dx: 250.02   100 each   12   . lisinopril (PRINIVIL,ZESTRIL) 10 MG tablet      TAKE ONE TABLET BY MOUTH ONCE DAILY IN THE MORNING   30 tablet   0   . metFORMIN (GLUCOPHAGE) 1000 MG tablet      TAKE ONE TABLET BY MOUTH TWICE DAILY FOR  DIABETES  (NO  FUTHER  REFILLS  UNTIL  HAS  APPOINTMENT  WITH  PCP)   60 tablet   0   . pravastatin (PRAVACHOL) 20 MG tablet   Oral   Take 1 tablet (20 mg total) by mouth at bedtime. For cholesterol (take instead of Simvastatin)   30 tablet   6   . RELION ULTRA THIN LANCETS 30G MISC      Use three to four lancets to check glucose every day.  Dx: 250.02   100 each   5    BP 124/73  Pulse 119  Temp(Src) 97.9 F (36.6 C) (Oral)  Resp 18  SpO2 97% Physical Exam  Nursing note and vitals reviewed. Constitutional: She is oriented to person, place, and time. She appears well-developed and well-nourished. No distress.  HENT:  Head: Normocephalic and atraumatic.  Nose: Mucosal edema present. Right sinus exhibits no maxillary sinus tenderness and no frontal sinus tenderness. Left sinus exhibits no maxillary sinus tenderness and no frontal sinus tenderness.  Mouth/Throat: Oropharynx is clear and moist. No oropharyngeal exudate, posterior oropharyngeal edema or posterior oropharyngeal erythema.  Nasal congestion.  Eyes: Conjunctivae and EOM are normal. Pupils are equal, round, and reactive to light.  Neck: Normal range of motion. Neck supple.  Cardiovascular: Regular rhythm, normal heart sounds and intact distal pulses.  Tachycardia present.   Tachy ~110  Pulmonary/Chest: Effort normal and breath sounds normal. No respiratory distress. She has no decreased breath sounds. She has no wheezes. She has no rhonchi. She has no  rales.  Abdominal: Soft. Bowel sounds are normal. There is no tenderness.  Musculoskeletal: Normal range of motion. She exhibits no edema.  Neurological: She is alert and oriented to person, place, and time.  Skin: Skin is warm and dry. She is not diaphoretic.  Psychiatric: She has a normal mood and affect. Her behavior is normal.    ED Course  Procedures (including critical care time) Labs Review Labs Reviewed - No data to display Imaging Review No results found.  EKG Interpretation   None       MDM   1. URI (upper respiratory infection)   2. Flu-like symptoms    Pt presenting with congestion/URI s/s. She is well appearing and in NAD, afebrile, tachy ~ 110, vitals otherwise normal. Lungs clear. Nasal congestion and mucosal edema noted. Voice sounds nasally. Symptoms present >72 hours, out of time-frame for tamiflu. Discussed symptomatic tx. Stable for discharge. Return precautions given. Patient states understanding of treatment care plan and is agreeable.    Trevor Mace, PA-C 10/11/13 [phone]

## 2013-10-11 NOTE — ED Notes (Addendum)
Pt reports bodyaches, headache, sore throat, congestion since yesterday. Denies fever, n/v/d. HR 120 at triage.

## 2013-10-18 ENCOUNTER — Emergency Department (HOSPITAL_COMMUNITY): Payer: BC Managed Care – PPO

## 2013-10-18 ENCOUNTER — Emergency Department (HOSPITAL_COMMUNITY)
Admission: EM | Admit: 2013-10-18 | Discharge: 2013-10-18 | Disposition: A | Discharge status: Home / Self Care | Payer: BC Managed Care – PPO | Attending: Emergency Medicine | Admitting: Emergency Medicine

## 2013-10-18 ENCOUNTER — Encounter (HOSPITAL_COMMUNITY): Payer: Self-pay | Admitting: Emergency Medicine

## 2013-10-18 DIAGNOSIS — Z79899 Other long term (current) drug therapy: Secondary | ICD-10-CM | POA: Insufficient documentation

## 2013-10-18 DIAGNOSIS — S8000XA Contusion of unspecified knee, initial encounter: Secondary | ICD-10-CM | POA: Insufficient documentation

## 2013-10-18 DIAGNOSIS — IMO0002 Reserved for concepts with insufficient information to code with codable children: Secondary | ICD-10-CM | POA: Insufficient documentation

## 2013-10-18 DIAGNOSIS — Y92410 Unspecified street and highway as the place of occurrence of the external cause: Secondary | ICD-10-CM | POA: Insufficient documentation

## 2013-10-18 DIAGNOSIS — S8002XA Contusion of left knee, initial encounter: Secondary | ICD-10-CM

## 2013-10-18 DIAGNOSIS — E119 Type 2 diabetes mellitus without complications: Secondary | ICD-10-CM | POA: Insufficient documentation

## 2013-10-18 DIAGNOSIS — Y9389 Activity, other specified: Secondary | ICD-10-CM | POA: Insufficient documentation

## 2013-10-18 DIAGNOSIS — Z7982 Long term (current) use of aspirin: Secondary | ICD-10-CM | POA: Insufficient documentation

## 2013-10-18 MED ORDER — IBUPROFEN 600 MG PO TABS
600.0000 mg | ORAL_TABLET | Freq: Four times a day (QID) | ORAL | Status: DC | PRN
Start: 2013-10-18 — End: 2016-12-18

## 2013-10-18 MED ORDER — CYCLOBENZAPRINE HCL 10 MG PO TABS: 10.0000 mg | ORAL_TABLET | Freq: Three times a day (TID) | ORAL | Status: DC | PRN
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
## 2013-10-18 NOTE — ED Provider Notes (Signed)
Medical screening examination/treatment/procedure(s) were performed by non-physician practitioner and as supervising physician I was immediately available for consultation/collaboration.  EKG Interpretation   None         Veeda Virgo, MD 10/18/13 2112 
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
## 2013-10-18 NOTE — ED Notes (Signed)
Pt ambulatory to exam room with steady gait.  

## 2013-10-27 ENCOUNTER — Encounter: Payer: Self-pay | Admitting: Family Medicine

## 2013-10-27 ENCOUNTER — Ambulatory Visit (INDEPENDENT_AMBULATORY_CARE_PROVIDER_SITE_OTHER): Payer: BC Managed Care – PPO | Admitting: Family Medicine

## 2013-10-27 VITALS — BP 102/69 | HR 103 | Temp 98.4°F | Ht 68.0 in | Wt 155.7 lb

## 2013-10-27 DIAGNOSIS — R059 Cough, unspecified: Secondary | ICD-10-CM | POA: Insufficient documentation

## 2013-10-27 DIAGNOSIS — M25569 Pain in unspecified knee: Secondary | ICD-10-CM

## 2013-10-27 DIAGNOSIS — M25562 Pain in left knee: Secondary | ICD-10-CM | POA: Insufficient documentation

## 2013-10-27 DIAGNOSIS — R05 Cough: Secondary | ICD-10-CM

## 2013-10-27 HISTORY — DX: Cough, unspecified: R05.9

## 2013-10-27 NOTE — Assessment & Plan Note (Signed)
Pt reports cough for the last few weeks, was worse with rhinitis and night sweats but these resolved, likely post-infectious bronchitis - f/u with pulmonologist as scheduled - no concern for pna on history or physical exam
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
## 2013-11-15 ENCOUNTER — Other Ambulatory Visit: Payer: Self-pay | Admitting: Family Medicine

## 2013-11-16 ENCOUNTER — Telehealth: Payer: Self-pay | Admitting: Family Medicine

## 2013-11-16 NOTE — Telephone Encounter (Signed)
Will fwd to MD.  Gregoire Bennis L, CMA  

## 2013-11-16 NOTE — Telephone Encounter (Signed)
Needs refill on liinopril [facility] at [place]

## 2013-11-19 ENCOUNTER — Other Ambulatory Visit: Payer: Self-pay | Admitting: Family Medicine

## 2013-11-20 ENCOUNTER — Telehealth: Payer: Self-pay | Admitting: Family Medicine

## 2013-11-20 NOTE — Telephone Encounter (Signed)
Pt called and needs a refill on her metformin. She is out. Amanda Shepherd

## 2013-11-21 ENCOUNTER — Other Ambulatory Visit: Payer: Self-pay | Admitting: Family Medicine

## 2013-11-21 MED ORDER — METFORMIN HCL 1000 MG PO TABS: 1000.0000 mg | ORAL_TABLET | Freq: Two times a day (BID) | ORAL | Status: DC

## 2013-11-21 MED ORDER — LISINOPRIL 10 MG PO TABS: 10.0000 mg | ORAL_TABLET | Freq: Every day | ORAL | Status: DC

## 2013-11-21 NOTE — Telephone Encounter (Signed)
Refills of metformin and lisinopril sent to pharmacy. Please inform patient.

## 2013-11-21 NOTE — Telephone Encounter (Signed)
Called pt. Informed. .Lei Dower  

## 2013-12-01 ENCOUNTER — Telehealth: Payer: Self-pay | Admitting: Family Medicine

## 2013-12-01 NOTE — Telephone Encounter (Signed)
Pt called for a  Refill on her glipizide called in . Enough to last until her appointment 2/19. Amanda Shepherd

## 2013-12-01 NOTE — Telephone Encounter (Signed)
Will fwd. To PCP for refill. Javier Glazier, Gerrit Heck

## 2013-12-02 MED ORDER — GLIPIZIDE 10 MG PO TABS: 20.0000 mg | ORAL_TABLET | Freq: Two times a day (BID) | ORAL | Status: DC

## 2013-12-02 NOTE — Telephone Encounter (Signed)
Refill sent to pharmacy. Please inform patient.

## 2013-12-04 NOTE — Telephone Encounter (Signed)
Pt notified.  Boston Cookson L, CMA  

## 2013-12-07 ENCOUNTER — Encounter: Payer: Self-pay | Admitting: Family Medicine

## 2013-12-07 ENCOUNTER — Other Ambulatory Visit: Payer: Self-pay | Admitting: *Deleted

## 2013-12-07 ENCOUNTER — Ambulatory Visit (INDEPENDENT_AMBULATORY_CARE_PROVIDER_SITE_OTHER): Payer: BC Managed Care – PPO | Admitting: Family Medicine

## 2013-12-07 VITALS — BP 131/82 | HR 97 | Ht 67.0 in | Wt 160.6 lb

## 2013-12-07 DIAGNOSIS — I10 Essential (primary) hypertension: Secondary | ICD-10-CM

## 2013-12-07 DIAGNOSIS — E1165 Type 2 diabetes mellitus with hyperglycemia: Secondary | ICD-10-CM

## 2013-12-07 DIAGNOSIS — IMO0001 Reserved for inherently not codable concepts without codable children: Secondary | ICD-10-CM

## 2013-12-07 DIAGNOSIS — IMO0002 Reserved for concepts with insufficient information to code with codable children: Secondary | ICD-10-CM

## 2013-12-07 LAB — POCT GLYCOSYLATED HEMOGLOBIN (HGB A1C): HEMOGLOBIN A1C: 9.8

## 2013-12-07 MED ORDER — GLIPIZIDE 10 MG PO TABS: 20.0000 mg | ORAL_TABLET | Freq: Two times a day (BID) | ORAL | Status: DC

## 2013-12-07 MED ORDER — LISINOPRIL 10 MG PO TABS: 10.0000 mg | ORAL_TABLET | Freq: Every day | ORAL | Status: DC

## 2013-12-07 MED ORDER — METFORMIN HCL 1000 MG PO TABS: 1000.0000 mg | ORAL_TABLET | Freq: Two times a day (BID) | ORAL | Status: DC

## 2013-12-07 NOTE — Assessment & Plan Note (Signed)
Uncontrolled, A1c 9.8 (down from 10.7) - patient has quit soda - has been taking metformin 1000 bid and glipizide 10 bid (was supposed to be 20 bid per Dr. Jackquline Berlin note) - increase glipizide dose - patient plans to start walking and exercise tape regimen, as well stop drinking sweet tea and eating fried food - recheck in 3 months, patient warned that insulin may be needed if still high at that visit

## 2013-12-07 NOTE — Patient Instructions (Signed)
Diabetes Meal Planning Guide The diabetes meal planning guide is a tool to help you plan your meals and snacks. It is important for people with diabetes to manage their blood glucose (sugar) levels. Choosing the right foods and the right amounts throughout your day will help control your blood glucose. Eating right can even help you improve your blood pressure and reach or maintain a healthy weight. CARBOHYDRATE COUNTING MADE EASY When you eat carbohydrates, they turn to sugar. This raises your blood glucose level. Counting carbohydrates can help you control this level so you feel better. When you plan your meals by counting carbohydrates, you can have more flexibility in what you eat and balance your medicine with your food intake. Carbohydrate counting simply means adding up the total amount of carbohydrate grams in your meals and snacks. Try to eat about the same amount at each meal. Foods with carbohydrates are listed below. Each portion below is 1 carbohydrate serving or 15 grams of carbohydrates. Ask your dietician how many grams of carbohydrates you should eat at each meal or snack. Grains and Starches  1 slice bread.   English muffin or hotdog/hamburger bun.   cup cold cereal (unsweetened).   cup cooked pasta or rice.   cup starchy vegetables (corn, potatoes, peas, beans, winter squash).  1 tortilla (6 inches).   bagel.  1 waffle or pancake (size of a CD).   cup cooked cereal.  4 to 6 small crackers. *Whole grain is recommended. Fruit  1 cup fresh unsweetened berries, melon, papaya, pineapple.  1 small fresh fruit.   banana or mango.   cup fruit juice (4 oz unsweetened).   cup canned fruit in natural juice or water.  2 tbs dried fruit.  12 to 15 grapes or cherries. Milk and Yogurt  1 cup fat-free or 1% milk.  1 cup soy milk.  6 oz light yogurt with sugar-free sweetener.  6 oz low-fat soy yogurt.  6 oz plain yogurt. Vegetables  1 cup raw or  cup  cooked is counted as 0 carbohydrates or a "free" food.  If you eat 3 or more servings at 1 meal, count them as 1 carbohydrate serving. Other Carbohydrates   oz chips or pretzels.   cup ice cream or frozen yogurt.   cup sherbet or sorbet.  2 inch square cake, no frosting.  1 tbs honey, sugar, jam, jelly, or syrup.  2 small cookies.  3 squares of graham crackers.  3 cups popcorn.  6 crackers.  1 cup broth-based soup.  Count 1 cup casserole or other mixed foods as 2 carbohydrate servings.  Foods with less than 20 calories in a serving may be counted as 0 carbohydrates or a "free" food. You may want to purchase a book or computer software that lists the carbohydrate gram counts of different foods. In addition, the nutrition facts panel on the labels of the foods you eat are a good source of this information. The label will tell you how big the serving size is and the total number of carbohydrate grams you will be eating per serving. Divide this number by 15 to obtain the number of carbohydrate servings in a portion. Remember, 1 carbohydrate serving equals 15 grams of carbohydrate. SERVING SIZES Measuring foods and serving sizes helps you make sure you are getting the right amount of food. The list below tells how big or small some common serving sizes are.  1 oz.........4 stacked dice.  3 oz.........Deck of cards.  1 tsp........Tip   of little finger.  1 tbs......Marland KitchenMarland KitchenThumb.  2 tbs.......Marland KitchenGolf ball.   cup......Marland KitchenHalf of a fist.  1 cup.......Marland KitchenA fist. SAMPLE DIABETES MEAL PLAN Below is a sample meal plan that includes foods from the grain and starches, dairy, vegetable, fruit, and meat groups. A dietician can individualize a meal plan to fit your calorie needs and tell you the number of servings needed from each food group. However, controlling the total amount of carbohydrates in your meal or snack is more important than making sure you include all of the food groups at every  meal. You may interchange carbohydrate containing foods (dairy, starches, and fruits). The meal plan below is an example of a 2000 calorie diet using carbohydrate counting. This meal plan has 17 carbohydrate servings. Breakfast  1 cup oatmeal (2 carb servings).   cup light yogurt (1 carb serving).  1 cup blueberries (1 carb serving).   cup almonds. Snack  1 large apple (2 carb servings).  1 low-fat string cheese stick. Lunch  Chicken breast salad.  1 cup spinach.   cup chopped tomatoes.  2 oz chicken breast, sliced.  2 tbs low-fat [place] dressing.  12 whole-wheat crackers (2 carb servings).  12 to 15 grapes (1 carb serving).  1 cup low-fat milk (1 carb serving). Snack  1 cup carrots.   cup hummus (1 carb serving). Dinner  3 oz broiled salmon.  1 cup brown rice (3 carb servings). Snack  1  cups steamed broccoli (1 carb serving) drizzled with 1 tsp olive oil and lemon juice.  1 cup light pudding (2 carb servings).

## 2013-12-07 NOTE — Telephone Encounter (Signed)
Please call patient back regarding the quantity and dosage of her medication for her glipizide.  Patient is confused about how many she's suppose to have each refill and how much to take daily.  Please call her asap before she pick up medication.

## 2013-12-07 NOTE — Progress Notes (Signed)
   Subjective:    Patient ID: Amanda Shepherd, female    DOB: 11-16-1950, 63 y.o.   MRN: [id number]  Diabetes Pertinent negatives for hypoglycemia include no headaches. Pertinent negatives for diabetes include no chest pain.   Pt presents for follow-up of diabetes. She reports she has stopped drinking Coke which she was previously "addicted" to. She is concerned about her medications as there has been considerable confusion with the pharmacy. She reports that she used to walk with a friend but they had a falling out, she is now ready to start exercising again. She has not been eating well but also says she is ready to get back on track with this. She is motivated to make necessary changes to avoid insulin.  CHRONIC DIABETES  Disease Monitoring  Blood Sugar Ranges: rarely checks, say's they are "not bad" but can't remember numbers  Polyuria: no   Visual problems: no   Medication Compliance: somewhat, ran out 1 week ago and glipizide has been wrong dose  Medication Side Effects  Hypoglycemia: no    Review of Systems  Constitutional: Negative for fever, activity change and unexpected weight change.  Respiratory: Negative for cough and shortness of breath.   Cardiovascular: Negative for chest pain.  Musculoskeletal: Positive for arthralgias.  Neurological: Negative for headaches.  All other systems reviewed and are negative.       Objective:   Physical Exam  Nursing note and vitals reviewed. Constitutional: She is oriented to person, place, and time. She appears well-developed and well-nourished. No distress.  HENT:  Head: Normocephalic and atraumatic.  Eyes: Conjunctivae are normal. Right eye exhibits no discharge. Left eye exhibits no discharge. No scleral icterus.  Neck: Normal range of motion.  Cardiovascular: Normal rate.   Pulmonary/Chest: Effort normal.  Abdominal: Soft. She exhibits no distension. There is no tenderness.  Musculoskeletal: Normal range of motion.    Neurological: She is alert and oriented to person, place, and time.  Skin: Skin is warm and dry. She is not diaphoretic.  Psychiatric: She has a normal mood and affect. Her behavior is normal.          Assessment & Plan:

## 2013-12-07 NOTE — Telephone Encounter (Signed)
Per Eusebio Friendly, she told me to disregard this message.  Pt has OV this afternoon at [place], Amanda Shepherd, CMA

## 2014-04-16 ENCOUNTER — Ambulatory Visit (INDEPENDENT_AMBULATORY_CARE_PROVIDER_SITE_OTHER): Payer: BC Managed Care – PPO | Admitting: Family Medicine

## 2014-04-16 ENCOUNTER — Encounter: Payer: Self-pay | Admitting: Family Medicine

## 2014-04-16 VITALS — BP 138/74 | HR 80 | Temp 97.9°F | Ht 67.0 in | Wt 153.0 lb

## 2014-04-16 DIAGNOSIS — IMO0001 Reserved for inherently not codable concepts without codable children: Secondary | ICD-10-CM

## 2014-04-16 DIAGNOSIS — IMO0002 Reserved for concepts with insufficient information to code with codable children: Secondary | ICD-10-CM

## 2014-04-16 DIAGNOSIS — E1165 Type 2 diabetes mellitus with hyperglycemia: Secondary | ICD-10-CM

## 2014-04-16 LAB — POCT GLYCOSYLATED HEMOGLOBIN (HGB A1C): Hemoglobin A1C: 8.6

## 2014-04-16 MED ORDER — SITAGLIPTIN PHOSPHATE 100 MG PO TABS: 100.0000 mg | ORAL_TABLET | Freq: Every day | ORAL | Status: DC

## 2014-04-16 NOTE — Progress Notes (Signed)
   Subjective:    Patient ID: Amanda Shepherd, female    DOB: June 08, 1951, 63 y.o.   MRN: [id number]  HPI Pt presents for DM follow up. Pt reports dietary indiscretion since last visit. Walks frequently, compliant with meds but not checking blood sugars because she doesn't like needles.   Review of Systems  Respiratory: Negative for shortness of breath.   Cardiovascular: Negative for chest pain.  Endocrine: Negative for polydipsia, polyphagia and polyuria.  Neurological: Negative for dizziness and light-headedness.  All other systems reviewed and are negative.      Objective:   Physical Exam  Nursing note and vitals reviewed. Constitutional: She is oriented to person, place, and time. She appears well-developed and well-nourished. No distress.  HENT:  Head: Normocephalic and atraumatic.  Eyes: Conjunctivae are normal. Right eye exhibits no discharge. Left eye exhibits no discharge. No scleral icterus.  Neck: Normal range of motion. Neck supple.  Cardiovascular: Normal rate, regular rhythm and normal heart sounds.   No murmur heard. Pulmonary/Chest: Effort normal. No respiratory distress. She has no wheezes.  Abdominal: Soft. She exhibits no distension. There is no tenderness.  Neurological: She is alert and oriented to person, place, and time.  Skin: Skin is warm and dry. She is not diaphoretic.  Psychiatric: She has a normal mood and affect. Her behavior is normal.          Assessment & Plan:

## 2014-04-16 NOTE — Assessment & Plan Note (Addendum)
A1c improved to 8.6 from 9.8 with glipizide increase and diet - add Tonga 100mg  daily - discussed hypoglycemia risk, pt agreed to start checking cbgs at home - will decrease sugar/carbs - continue walking/physical activity - recheck in 1 mo (can combine janumet at that time if working/tolerating) - if still uncontrolled, may need koval/insulin - continue lisinopril for renal protection - update eye and foot exam next visit as well as remaining health maintenance

## 2014-04-16 NOTE — Patient Instructions (Signed)
Your blood sugar control is better today but still not at goal. Please continue taking your medications as you have been. I am adding another diabetes medicine to help get better control and hopefully avoid insulin. The new medicine is called Tonga and you take it once a day. Please also continue getting physical activity and watching the sweets and simple carbohydrates (bread, pasta, potatoes).  I would like to see you back in 1 month to see how things are going.  Thank you.  Dr. Sherril Cong

## 2014-04-23 ENCOUNTER — Emergency Department (HOSPITAL_COMMUNITY)
Admission: EM | Admit: 2014-04-23 | Discharge: 2014-04-23 | Disposition: A | Discharge status: Home / Self Care | Payer: BC Managed Care – PPO | Attending: Emergency Medicine | Admitting: Emergency Medicine

## 2014-04-23 ENCOUNTER — Encounter (HOSPITAL_COMMUNITY): Payer: Self-pay | Admitting: Emergency Medicine

## 2014-04-23 ENCOUNTER — Emergency Department (HOSPITAL_COMMUNITY): Payer: BC Managed Care – PPO

## 2014-04-23 DIAGNOSIS — Z792 Long term (current) use of antibiotics: Secondary | ICD-10-CM | POA: Insufficient documentation

## 2014-04-23 DIAGNOSIS — N12 Tubulo-interstitial nephritis, not specified as acute or chronic: Secondary | ICD-10-CM | POA: Insufficient documentation

## 2014-04-23 DIAGNOSIS — E119 Type 2 diabetes mellitus without complications: Secondary | ICD-10-CM | POA: Insufficient documentation

## 2014-04-23 DIAGNOSIS — Z79899 Other long term (current) drug therapy: Secondary | ICD-10-CM | POA: Insufficient documentation

## 2014-04-23 DIAGNOSIS — Z90710 Acquired absence of both cervix and uterus: Secondary | ICD-10-CM | POA: Insufficient documentation

## 2014-04-23 DIAGNOSIS — Z7982 Long term (current) use of aspirin: Secondary | ICD-10-CM | POA: Insufficient documentation

## 2014-04-23 LAB — URINALYSIS, ROUTINE W REFLEX MICROSCOPIC
Bilirubin Urine: NEGATIVE
Glucose, UA: NEGATIVE mg/dL
Ketones, ur: NEGATIVE mg/dL
NITRITE: POSITIVE — AB
PROTEIN: 30 mg/dL — AB
Specific Gravity, Urine: 1.02 (ref 1.005–1.030)
UROBILINOGEN UA: 0.2 mg/dL (ref 0.0–1.0)
pH: 5 (ref 5.0–8.0)

## 2014-04-23 LAB — BASIC METABOLIC PANEL
Anion gap: 17 — ABNORMAL HIGH (ref 5–15)
BUN: 48 mg/dL — ABNORMAL HIGH (ref 6–23)
CALCIUM: 9.4 mg/dL (ref 8.4–10.5)
CO2: 21 mEq/L (ref 19–32)
Chloride: 104 mEq/L (ref 96–112)
Creatinine, Ser: 1.33 mg/dL — ABNORMAL HIGH (ref 0.50–1.10)
GFR calc Af Amer: 49 mL/min — ABNORMAL LOW (ref >90)
GFR, EST NON AFRICAN AMERICAN: 42 mL/min — AB (ref >90)
Glucose, Bld: 111 mg/dL — ABNORMAL HIGH (ref 70–99)
Potassium: 4.4 mEq/L (ref 3.7–5.3)
Sodium: 142 mEq/L (ref 137–147)

## 2014-04-23 LAB — CBC WITH DIFFERENTIAL/PLATELET
Basophils Absolute: 0.1 10*3/uL (ref 0.0–0.1)
Basophils Relative: 1 % (ref 0–1)
EOS PCT: 4 % (ref 0–5)
Eosinophils Absolute: 0.3 10*3/uL (ref 0.0–0.7)
HCT: 36.5 % (ref 36.0–46.0)
Hemoglobin: 12.3 g/dL (ref 12.0–15.0)
LYMPHS PCT: 31 % (ref 12–46)
Lymphs Abs: 2.2 10*3/uL (ref 0.7–4.0)
MCH: 30.2 pg (ref 26.0–34.0)
MCHC: 33.7 g/dL (ref 30.0–36.0)
MCV: 89.7 fL (ref 78.0–100.0)
Monocytes Absolute: 0.8 10*3/uL (ref 0.1–1.0)
Monocytes Relative: 12 % (ref 3–12)
NEUTROS PCT: 52 % (ref 43–77)
Neutro Abs: 3.7 10*3/uL (ref 1.7–7.7)
PLATELETS: 316 10*3/uL (ref 150–400)
RBC: 4.07 MIL/uL (ref 3.87–5.11)
RDW: 12.6 % (ref 11.5–15.5)
WBC: 7.1 10*3/uL (ref 4.0–10.5)

## 2014-04-23 LAB — URINE MICROSCOPIC-ADD ON

## 2014-04-23 MED ORDER — SODIUM CHLORIDE 0.9 % IV BOLUS (SEPSIS)
1000.0000 mL | Freq: Once | INTRAVENOUS | Status: AC
  Administered 2014-04-23: 1000 mL via INTRAVENOUS

## 2014-04-23 MED ORDER — ONDANSETRON HCL 4 MG/2ML IJ SOLN
4.0000 mg | Freq: Once | INTRAMUSCULAR | Status: AC
  Administered 2014-04-23: 4 mg via INTRAVENOUS
  Filled 2014-04-23: qty 2

## 2014-04-23 MED ORDER — MORPHINE SULFATE 4 MG/ML IJ SOLN
4.0000 mg | Freq: Once | INTRAMUSCULAR | Status: DC
  Filled 2014-04-23: qty 1

## 2014-04-23 MED ORDER — CEFUROXIME AXETIL 250 MG PO TABS: 250.0000 mg | ORAL_TABLET | Freq: Two times a day (BID) | ORAL | Status: DC

## 2014-04-23 MED ORDER — DEXTROSE 5 % IV SOLN
1.0000 g | Freq: Once | INTRAVENOUS | Status: AC
  Administered 2014-04-23: 1 g via INTRAVENOUS
  Filled 2014-04-23: qty 10

## 2014-04-23 MED ORDER — HYDROCODONE-ACETAMINOPHEN 5-325 MG PO TABS: 2.0000 | ORAL_TABLET | ORAL | Status: DC | PRN

## 2014-04-23 MED ORDER — ONDANSETRON HCL 4 MG PO TABS: 4.0000 mg | ORAL_TABLET | Freq: Four times a day (QID) | ORAL | Status: DC

## 2014-04-23 NOTE — Discharge Instructions (Signed)
Pyelonephritis, Adult °Pyelonephritis is a kidney infection. In general, there are 2 main types of pyelonephritis: °· Infections that come on quickly without any warning (acute pyelonephritis). °· Infections that persist for a long period of time (chronic pyelonephritis). °CAUSES  °Two main causes of pyelonephritis are: °· Bacteria traveling from the bladder to the kidney. This is a problem especially in pregnant women. The urine in the bladder can become filled with bacteria from multiple causes, including: °¨ Inflammation of the prostate gland (prostatitis). °¨ Sexual intercourse in females. °¨ Bladder infection (cystitis). °· Bacteria traveling from the bloodstream to the tissue part of the kidney. °Problems that may increase your risk of getting a kidney infection include: °· Diabetes. °· Kidney stones or bladder stones. °· Cancer. °· Catheters placed in the bladder. °· Other abnormalities of the kidney or ureter. °SYMPTOMS  °· Abdominal pain. °· Pain in the side or flank area. °· Fever. °· Chills. °· Upset stomach. °· Blood in the urine (dark urine). °· Frequent urination. °· Strong or persistent urge to urinate. °· Burning or stinging when urinating. °DIAGNOSIS  °Your caregiver may diagnose your kidney infection based on your symptoms. A urine sample may also be taken. °TREATMENT  °In general, treatment depends on how severe the infection is.  °· If the infection is mild and caught early, your caregiver may treat you with oral antibiotics and send you home. °· If the infection is more severe, the bacteria may have gotten into the bloodstream. This will require intravenous (IV) antibiotics and a hospital stay. Symptoms may include: °¨ High fever. °¨ Severe flank pain. °¨ Shaking chills. °· Even after a hospital stay, your caregiver may require you to be on oral antibiotics for a period of time. °· Other treatments may be required depending upon the cause of the infection. °HOME CARE INSTRUCTIONS  °· Take your  antibiotics as directed. Finish them even if you start to feel better. °· Make an appointment to have your urine checked to make sure the infection is gone. °· Drink enough fluids to keep your urine clear or pale yellow. °· Take medicines for the bladder if you have urgency and frequency of urination as directed by your caregiver. °SEEK IMMEDIATE MEDICAL CARE IF:  °· You have a fever or persistent symptoms for more than 2-3 days. °· You have a fever and your symptoms suddenly get worse. °· You are unable to take your antibiotics or fluids. °· You develop shaking chills. °· You experience extreme weakness or fainting. °· There is no improvement after 2 days of treatment. °MAKE SURE YOU: °· Understand these instructions. °· Will watch your condition. °· Will get help right away if you are not doing well or get worse. °Document Released: 10/05/2005 Document Revised: 04/05/2012 Document Reviewed: 03/11/2011 °ExitCare® Patient Information ©2015 [employer]. This information is not intended to replace advice given to you by your health care provider. Make sure you discuss any questions you have with your health care provider. ° °

## 2014-04-23 NOTE — ED Notes (Signed)
Patient reports Left flank pain that started on Saturday. Patient c/o fever of unknown value, states she was sweating and skin was hot to touch. Patient also c/o of emesis x1 on Sunday. 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

## 2014-04-23 NOTE — ED Provider Notes (Signed)
CSN: [id number]     Arrival date & time 04/23/14  2015 History   First MD Initiated Contact with Patient 04/23/14 2048     Chief Complaint  Patient presents with  . Flank Pain    left sided  . Fever    unknown temp  . Emesis    once on Sunday morning     (Consider location/radiation/quality/duration/timing/severity/associated sxs/prior Treatment) HPI Comments: Patient presents to ER for evaluation of left flank pain. Patient reports that the symptoms began 2 days ago. Pain has been intermittent. She has been expressing intermittent sharp, stabbing pains in the left lower back area. The area has felt warm to touch at times. She felt like she might have had a fever yesterday. She did have nausea with the pain and did vomit. She is not experiencing any urinary symptoms. There is no pain in the abdomen.  Patient is a 63 y.o. female presenting with flank pain, fever, and vomiting.  Flank Pain  Fever Associated symptoms: vomiting   Emesis   Past Medical History  Diagnosis Date  . Diabetes mellitus   . Hyperlipidemia    Past Surgical History  Procedure Laterality Date  . Abdominal hysterectomy     Family History  Problem Relation Age of Onset  . Hypertension Mother    History  Substance Use Topics  . Smoking status: Never Smoker   . Smokeless tobacco: Never Used  . Alcohol Use: No   OB History   Grav Para Term Preterm Abortions TAB SAB Ect Mult Living                 Review of Systems  Constitutional: Positive for fever.  Gastrointestinal: Positive for vomiting.  Genitourinary: Positive for flank pain.  Musculoskeletal: Positive for back pain.  All other systems reviewed and are negative.     Allergies  Atorvastatin  Home Medications   Prior to Admission medications   Medication Sig Start Date End Date Taking? Authorizing Provider  aspirin EC 81 MG tablet Take 81 mg by mouth daily.   Yes Historical Provider, MD  ciprofloxacin (CIPRO) 500 MG tablet Take 500  mg by mouth 2 (two) times daily.   Yes Historical Provider, MD  glipiZIDE (GLUCOTROL) 10 MG tablet Take 2 tablets (20 mg total) by mouth 2 (two) times daily before a meal. 12/07/13  Yes Frazier Richards, MD  ibuprofen (ADVIL,MOTRIN) 600 MG tablet Take 1 tablet (600 mg total) by mouth every 6 (six) hours as needed for mild pain or moderate pain. 10/18/13  Yes Clayton Bibles, PA-C  lisinopril (PRINIVIL,ZESTRIL) 10 MG tablet Take 1 tablet (10 mg total) by mouth daily. 12/07/13  Yes Frazier Richards, MD  metFORMIN (GLUCOPHAGE) 1000 MG tablet Take 1 tablet (1,000 mg total) by mouth 2 (two) times daily with a meal. 12/07/13  Yes Frazier Richards, MD  sitaGLIPtin (JANUVIA) 100 MG tablet Take 1 tablet (100 mg total) by mouth daily. 04/16/14  Yes Frazier Richards, MD   BP 108/61  Pulse 113  Temp(Src) 98.3 F (36.8 C) (Oral)  Ht 5\' 8"  (1.727 m)  Wt 153 lb (69.4 kg)  BMI 23.27 kg/m2  SpO2 97% Physical Exam  Constitutional: She is oriented to person, place, and time. She appears well-developed and well-nourished. No distress.  HENT:  Head: Normocephalic and atraumatic.  Right Ear: Hearing normal.  Left Ear: Hearing normal.  Nose: Nose normal.  Mouth/Throat: Oropharynx is clear and moist and mucous membranes are normal.  Eyes: Conjunctivae  and EOM are normal. Pupils are equal, round, and reactive to light.  Neck: Normal range of motion. Neck supple.  Cardiovascular: Regular rhythm, S1 normal and S2 normal.  Exam reveals no gallop and no friction rub.   No murmur heard. Pulmonary/Chest: Effort normal and breath sounds normal. No respiratory distress. She exhibits no tenderness.  Abdominal: Soft. Normal appearance and bowel sounds are normal. There is no hepatosplenomegaly. There is no tenderness. There is no rebound, no guarding, no tenderness at McBurney's point and negative Murphy's sign. No hernia.  Musculoskeletal: Normal range of motion.  Neurological: She is alert and oriented to person, place, and time. She has  normal strength. No cranial nerve deficit or sensory deficit. Coordination normal. GCS eye subscore is 4. GCS verbal subscore is 5. GCS motor subscore is 6.  Skin: Skin is warm, dry and intact. No rash noted. No cyanosis.  Psychiatric: She has a normal mood and affect. Her speech is normal and behavior is normal. Thought content normal.    ED Course  Procedures (including critical care time) Labs Review Labs Reviewed  URINALYSIS, ROUTINE W REFLEX MICROSCOPIC - Abnormal; Notable for the following:    APPearance CLOUDY (*)    Hgb urine dipstick TRACE (*)    Protein, ur 30 (*)    Nitrite POSITIVE (*)    Leukocytes, UA MODERATE (*)    All other components within normal limits  URINE MICROSCOPIC-ADD ON - Abnormal; Notable for the following:    Bacteria, UA MANY (*)    Crystals CA OXALATE CRYSTALS (*)    All other components within normal limits  CBC WITH DIFFERENTIAL  BASIC METABOLIC PANEL    Imaging Review Ct Abdomen Pelvis Wo Contrast  04/23/2014   CLINICAL DATA:  Left flank pain starting on Saturday. Fever. Sweating and skin hot. Emesis on Sunday.  EXAM: CT ABDOMEN AND PELVIS WITHOUT CONTRAST  TECHNIQUE: Multidetector CT imaging of the abdomen and pelvis was performed following the standard protocol without IV contrast.  COMPARISON:  None.  FINDINGS: Lung bases are clear.  The kidneys appear symmetrical in size and shape bilaterally. No pyelocaliectasis or ureterectasis. No renal, ureteral, or bladder stones identified. Bladder is decompressed.  The unenhanced appearance of the liver, spleen, gallbladder, pancreas, adrenal glands, abdominal aorta, inferior vena cava, and retroperitoneal lymph nodes is unremarkable. The stomach, small bowel, and colon are mostly decompressed. No free air or free fluid in the abdomen.  Pelvis: No pelvic mass or lymphadenopathy. No diverticulitis. Appendix is not identified. Degenerative changes in the spine. No destructive bone lesions.  IMPRESSION: No renal or  ureteral stone or obstruction demonstrated.   Electronically Signed   By: Lucienne Capers M.D.   On: 04/23/2014 21:51     EKG Interpretation None      MDM   Final diagnoses:  None   Patient presents to ER with complaints of left flank pain, nausea, vomiting. Symptoms present for 2 days. She also thinks she might have a fever. She is in no significant distress arrival, but is slightly tachycardic. This has improved with hydration. Patient has declined pain medication. Workup is consistent with infection in the urinary tract, likely early pyelonephritis. Patient administered IV Rocephin, will discharge with analgesia, Ceftin, Zofran. Return if her symptoms worsen. Otherwise follow up with primary doctor. Urine culture sent.    Orpah Greek, MD 04/23/14 2220

## 2014-04-25 LAB — URINE CULTURE

## 2014-05-17 ENCOUNTER — Telehealth: Payer: Self-pay | Admitting: Family Medicine

## 2014-05-17 DIAGNOSIS — E1165 Type 2 diabetes mellitus with hyperglycemia: Secondary | ICD-10-CM

## 2014-05-17 DIAGNOSIS — IMO0002 Reserved for concepts with insufficient information to code with codable children: Secondary | ICD-10-CM

## 2014-05-17 NOTE — Telephone Encounter (Signed)
Need refill on test strips sent to [facility] on [address]

## 2014-05-18 MED ORDER — GLUCOSE BLOOD VI STRP: ORAL_STRIP | Status: DC

## 2014-05-18 NOTE — Telephone Encounter (Signed)
Refill sent to pharmacy. Please inform patient.

## 2014-05-30 ENCOUNTER — Ambulatory Visit (INDEPENDENT_AMBULATORY_CARE_PROVIDER_SITE_OTHER): Payer: BC Managed Care – PPO | Admitting: Family Medicine

## 2014-05-30 ENCOUNTER — Encounter: Payer: Self-pay | Admitting: Family Medicine

## 2014-05-30 VITALS — BP 137/83 | HR 78 | Temp 98.6°F | Ht 66.73 in | Wt 151.7 lb

## 2014-05-30 DIAGNOSIS — IMO0002 Reserved for concepts with insufficient information to code with codable children: Secondary | ICD-10-CM

## 2014-05-30 DIAGNOSIS — IMO0001 Reserved for inherently not codable concepts without codable children: Secondary | ICD-10-CM

## 2014-05-30 DIAGNOSIS — E1165 Type 2 diabetes mellitus with hyperglycemia: Secondary | ICD-10-CM

## 2014-05-30 DIAGNOSIS — E119 Type 2 diabetes mellitus without complications: Secondary | ICD-10-CM

## 2014-05-30 LAB — HM DIABETES EYE EXAM
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
## 2014-05-30 NOTE — Patient Instructions (Signed)
Please keep taking your metformin and januvia as you have been every day. Stop taking the glipizide for now so that we can see how your sugars are running without it and try to avoid some of the lows you have been getting. When you do feel like your blood sugar is low, check it and write it down and then eat or drink a small snack to bring it up.  I would like to see you back in 2 months to see how things have been going.

## 2014-05-30 NOTE — Assessment & Plan Note (Signed)
CBG range 42-170 in am, taking meds sporadically as she was dropping low. She likes the Tonga but thinks the others are too much. - stop glipizide, continue Tonga and metformin - consider decreasing januvia dose if still getting low on this - A1c in 2 months to guide this decision further.

## 2014-06-04 ENCOUNTER — Encounter: Payer: Self-pay | Admitting: Family Medicine

## 2014-06-04 DIAGNOSIS — E113299 Type 2 diabetes mellitus with mild nonproliferative diabetic retinopathy without macular edema, unspecified eye: Secondary | ICD-10-CM | POA: Insufficient documentation

## 2014-06-04 DIAGNOSIS — E11329 Type 2 diabetes mellitus with mild nonproliferative diabetic retinopathy without macular edema: Secondary | ICD-10-CM

## 2014-07-27 ENCOUNTER — Other Ambulatory Visit: Payer: Self-pay

## 2014-07-27 DIAGNOSIS — Z1231 Encounter for screening mammogram for malignant neoplasm of breast: Secondary | ICD-10-CM

## 2014-08-03 ENCOUNTER — Ambulatory Visit
Admission: RE | Admit: 2014-08-03 | Discharge: 2014-08-03 | Disposition: A | Discharge status: Home / Self Care | Payer: BC Managed Care – PPO | Source: Ambulatory Visit

## 2014-08-03 ENCOUNTER — Encounter (INDEPENDENT_AMBULATORY_CARE_PROVIDER_SITE_OTHER): Payer: Self-pay

## 2014-08-03 DIAGNOSIS — Z1231 Encounter for screening mammogram for malignant neoplasm of breast: Secondary | ICD-10-CM

## 2014-08-21 ENCOUNTER — Other Ambulatory Visit: Payer: Self-pay | Admitting: Family Medicine

## 2014-08-21 DIAGNOSIS — E1165 Type 2 diabetes mellitus with hyperglycemia: Secondary | ICD-10-CM

## 2014-08-21 DIAGNOSIS — IMO0002 Reserved for concepts with insufficient information to code with codable children: Secondary | ICD-10-CM

## 2014-08-21 MED ORDER — METFORMIN HCL 1000 MG PO TABS: 1000.0000 mg | ORAL_TABLET | Freq: Two times a day (BID) | ORAL | Status: DC

## 2014-08-21 NOTE — Telephone Encounter (Signed)
Patient informed, expressed understanding. 

## 2014-08-21 NOTE — Telephone Encounter (Signed)
Refills sent to pharmacy. Please inform patient. 

## 2014-08-21 NOTE — Telephone Encounter (Signed)
Patient also need to have refill on her Metformin.  Please send to pharmacy as well. Call to let her know when completed.

## 2014-12-07 ENCOUNTER — Other Ambulatory Visit: Payer: Self-pay | Admitting: Family Medicine

## 2014-12-07 DIAGNOSIS — E119 Type 2 diabetes mellitus without complications: Secondary | ICD-10-CM

## 2014-12-19 ENCOUNTER — Other Ambulatory Visit: Payer: Self-pay | Admitting: Family Medicine

## 2014-12-19 DIAGNOSIS — I10 Essential (primary) hypertension: Secondary | ICD-10-CM

## 2014-12-24 ENCOUNTER — Other Ambulatory Visit: Payer: Self-pay | Admitting: *Deleted

## 2014-12-24 DIAGNOSIS — E1165 Type 2 diabetes mellitus with hyperglycemia: Secondary | ICD-10-CM

## 2014-12-24 DIAGNOSIS — IMO0002 Reserved for concepts with insufficient information to code with codable children: Secondary | ICD-10-CM

## 2014-12-24 MED ORDER — GLIPIZIDE 10 MG PO TABS: 20.0000 mg | ORAL_TABLET | Freq: Two times a day (BID) | ORAL | Status: DC

## 2015-01-01 ENCOUNTER — Emergency Department (HOSPITAL_COMMUNITY)
Admission: EM | Admit: 2015-01-01 | Discharge: 2015-01-01 | Disposition: A | Discharge status: Home / Self Care | Payer: BC Managed Care – PPO | Source: Home / Self Care | Attending: Family Medicine | Admitting: Family Medicine

## 2015-01-01 ENCOUNTER — Encounter (HOSPITAL_COMMUNITY): Payer: Self-pay | Admitting: *Deleted

## 2015-01-01 DIAGNOSIS — B9789 Other viral agents as the cause of diseases classified elsewhere: Principal | ICD-10-CM

## 2015-01-01 DIAGNOSIS — J069 Acute upper respiratory infection, unspecified: Secondary | ICD-10-CM

## 2015-01-01 MED ORDER — BENZONATATE 100 MG PO CAPS: 100.0000 mg | ORAL_CAPSULE | Freq: Three times a day (TID) | ORAL | Status: DC | PRN

## 2015-01-01 MED ORDER — FLUTICASONE PROPIONATE 50 MCG/ACT NA SUSP: 2.0000 | Freq: Every day | NASAL | Status: DC

## 2015-01-01 MED ORDER — HYDROCOD POLST-CHLORPHEN POLST 10-8 MG/5ML PO LQCR: 2.5000 mL | Freq: Every evening | ORAL | Status: DC | PRN

## 2015-01-01 MED ORDER — IPRATROPIUM BROMIDE 0.06 % NA SOLN: 2.0000 | Freq: Four times a day (QID) | NASAL | Status: DC

## 2015-01-01 NOTE — Discharge Instructions (Signed)
Your symptoms are likely due to nasal congestion and postnasal drip causing lung irritation and mild bronchitis. This will be best treated with medications to control your symptoms of nasal congestion with additional cough medicines. Please start the nasal Atrovent as prescribed and Flonase. Please also consider taking a daily allergy pill such as Zyrtec. Please use the Tessalon Perles during the daytime to help relieve your cough. Please use the nighttime cough medicine sparingly. There is no need for antibiotics at this period in time. You may also consider using ibuprofen for additional inflammatory relief.

## 2015-01-01 NOTE — ED Notes (Signed)
Pt  Reports     Symptoms   Of  Cough     Congested         Symptoms  X  1  Week              The  Cough  For  The  Most  Part  Is  Non  Productive

## 2015-01-01 NOTE — ED Provider Notes (Signed)
CSN: [id number]     Arrival date & time 01/01/15  1216 History   First MD Initiated Contact with Patient 01/01/15 1450     Chief Complaint  Patient presents with  . URI   (Consider location/radiation/quality/duration/timing/severity/associated sxs/prior Treatment) HPI  7 days ago developed cough, congestion and fever. Fever has resolved. Seasonal allergies which are starting. Still with runny nose and cough. Denies sinus pain and pressure, headache, fever, chest pain, shortness of breath, palpitations, nausea, vomiting, diarrhea, dysuria, frequency, back pain. Overall symptoms are no better and no worse. Reports taking over-the-counter Alka-Seltzer without significant improvement. Symptoms are intermittent. Worse at night.    Past Medical History  Diagnosis Date  . Diabetes mellitus   . Hyperlipidemia    Past Surgical History  Procedure Laterality Date  . Abdominal hysterectomy     Family History  Problem Relation Age of Onset  . Hypertension Mother    History  Substance Use Topics  . Smoking status: Never Smoker   . Smokeless tobacco: Never Used  . Alcohol Use: No   OB History    No data available     Review of Systems Per HPI with all other pertinent systems negative.   Allergies  Atorvastatin  Home Medications   Prior to Admission medications   Medication Sig Start Date End Date Taking? Authorizing Provider  aspirin EC 81 MG tablet Take 81 mg by mouth daily.    Historical Provider, MD  benzonatate (TESSALON PERLES) 100 MG capsule Take 1-2 capsules (100-200 mg total) by mouth 3 (three) times daily as needed for cough. 01/01/15   Waldemar Dickens, MD  cefUROXime (CEFTIN) 250 MG tablet Take 1 tablet (250 mg total) by mouth 2 (two) times daily with a meal. 04/23/14   Orpah Greek, MD  chlorpheniramine-HYDROcodone (TUSSIONEX PENNKINETIC ER) 10-8 MG/5ML LQCR Take 2.5-5 mLs by mouth at bedtime as needed for cough. 01/01/15   Waldemar Dickens, MD  fluticasone  (FLONASE) 50 MCG/ACT nasal spray Place 2 sprays into both nostrils at bedtime. 01/01/15   Waldemar Dickens, MD  glipiZIDE (GLUCOTROL) 10 MG tablet Take 2 tablets (20 mg total) by mouth 2 (two) times daily before a meal. 12/24/14   Frazier Richards, MD  glucose blood (RELION CONFIRM/MICRO TEST) test strip Use for checking blood sugars three to four times a day.  Dx: 250.02 05/18/14   Frazier Richards, MD  HYDROcodone-acetaminophen (NORCO/VICODIN) 5-325 MG per tablet Take 2 tablets by mouth every 4 (four) hours as needed for moderate pain. 04/23/14   Orpah Greek, MD  ibuprofen (ADVIL,MOTRIN) 600 MG tablet Take 1 tablet (600 mg total) by mouth every 6 (six) hours as needed for mild pain or moderate pain. 10/18/13   Clayton Bibles, PA-C  ipratropium (ATROVENT) 0.06 % nasal spray Place 2 sprays into both nostrils 4 (four) times daily. 01/01/15   Waldemar Dickens, MD  JANUVIA 100 MG tablet TAKE ONE TABLET BY MOUTH ONCE DAILY 08/21/14   Frazier Richards, MD  lisinopril (PRINIVIL,ZESTRIL) 10 MG tablet Take 1 tablet (10 mg total) by mouth daily. 12/07/13   Frazier Richards, MD  lisinopril (PRINIVIL,ZESTRIL) 10 MG tablet TAKE ONE TABLET BY MOUTH ONCE DAILY 12/20/14   Frazier Richards, MD  metFORMIN (GLUCOPHAGE) 1000 MG tablet Take 1 tablet (1,000 mg total) by mouth 2 (two) times daily with a meal. 08/21/14   Frazier Richards, MD  metFORMIN (GLUCOPHAGE) 1000 MG tablet TAKE ONE TABLET BY MOUTH TWICE DAILY WITH  MEALS 12/07/14   Frazier Richards, MD  ondansetron (ZOFRAN) 4 MG tablet Take 1 tablet (4 mg total) by mouth every 6 (six) hours. 04/23/14   Orpah Greek, MD   BP 123/82 mmHg  Pulse 86  Temp(Src) 97 F (36.1 C) (Oral)  Resp 12  SpO2 97% Physical Exam  Constitutional: She is oriented to person, place, and time. She appears well-developed and well-nourished.  HENT:  Head: Normocephalic and atraumatic.  Nasal congestion Frontal and maxillary sinuses nontender to palpation  Eyes: EOM are normal. Pupils are equal, round, and  reactive to light.  Neck: Normal range of motion.  Cardiovascular: Normal rate, normal heart sounds and intact distal pulses.   Pulmonary/Chest: Effort normal and breath sounds normal. No respiratory distress. She has no wheezes. She has no rales. She exhibits no tenderness.  Abdominal: Soft. Bowel sounds are normal.  Musculoskeletal: Normal range of motion. She exhibits no tenderness.  Lymphadenopathy:    She has no cervical adenopathy.  Neurological: She is alert and oriented to person, place, and time.  Skin: Skin is warm.  Psychiatric: She has a normal mood and affect. Her behavior is normal. Judgment and thought content normal.    ED Course  Procedures (including critical care time) Labs Review Labs Reviewed - No data to display  Imaging Review No results found.   MDM   1. Viral URI with cough    Start nasal Atrovent, Flonase, Zyrtec, Mucinex, NSAIDs, Tessalon pearls, TUssionex at night. Precautions given and all questions answered  Linna Darner, MD Family Medicine 01/01/2015, 3:20 PM      Waldemar Dickens, MD 01/01/15 1520

## 2015-01-25 ENCOUNTER — Ambulatory Visit (HOSPITAL_COMMUNITY)
Admission: RE | Admit: 2015-01-25 | Discharge: 2015-01-25 | Disposition: A | Discharge status: Home / Self Care | Payer: BC Managed Care – PPO | Source: Ambulatory Visit | Attending: Pulmonary Disease | Admitting: Pulmonary Disease

## 2015-01-25 ENCOUNTER — Other Ambulatory Visit (HOSPITAL_COMMUNITY): Payer: Self-pay | Admitting: Pulmonary Disease

## 2015-01-25 ENCOUNTER — Emergency Department (HOSPITAL_COMMUNITY)
Admission: EM | Admit: 2015-01-25 | Discharge: 2015-01-25 | Disposition: A | Discharge status: Home / Self Care | Payer: BC Managed Care – PPO

## 2015-01-25 DIAGNOSIS — R05 Cough: Secondary | ICD-10-CM | POA: Diagnosis present

## 2015-01-25 DIAGNOSIS — R059 Cough, unspecified: Secondary | ICD-10-CM

## 2015-01-25 NOTE — ED Notes (Signed)
Pt wrongfully arrived, meant to be at imaging.

## 2015-01-28 ENCOUNTER — Other Ambulatory Visit (HOSPITAL_COMMUNITY): Payer: Self-pay | Admitting: Pulmonary Disease

## 2015-12-12 ENCOUNTER — Other Ambulatory Visit: Payer: Self-pay | Admitting: Family Medicine

## 2015-12-12 DIAGNOSIS — E119 Type 2 diabetes mellitus without complications: Secondary | ICD-10-CM

## 2015-12-18 ENCOUNTER — Telehealth: Payer: Self-pay | Admitting: Family Medicine

## 2015-12-18 DIAGNOSIS — E1165 Type 2 diabetes mellitus with hyperglycemia: Principal | ICD-10-CM

## 2015-12-18 DIAGNOSIS — E113299 Type 2 diabetes mellitus with mild nonproliferative diabetic retinopathy without macular edema, unspecified eye: Secondary | ICD-10-CM

## 2015-12-18 NOTE — Telephone Encounter (Signed)
Would like a blood sugar meter [facility] at [place]

## 2015-12-19 MED ORDER — GLUCOSE BLOOD VI STRP: 1.0000 | ORAL_STRIP | Freq: Two times a day (BID) | Status: DC

## 2015-12-19 MED ORDER — RELION PRIME MONITOR DEVI: 1.0000 | Freq: Two times a day (BID) | Status: DC

## 2015-12-19 MED ORDER — RELION LANCETS ULTRA-THIN 30G MISC: 1.0000 | Freq: Two times a day (BID) | Status: DC

## 2015-12-19 NOTE — Telephone Encounter (Signed)
Patient informed, expressed understanding. 

## 2015-12-19 NOTE — Telephone Encounter (Signed)
Rx sent to pharmacy, please inform patient. If not covered and insurance requires another type, please have her call and tell us which to prescribe.

## 2015-12-20 ENCOUNTER — Telehealth: Payer: Self-pay | Admitting: *Deleted

## 2015-12-20 DIAGNOSIS — E1165 Type 2 diabetes mellitus with hyperglycemia: Principal | ICD-10-CM

## 2015-12-20 DIAGNOSIS — E113299 Type 2 diabetes mellitus with mild nonproliferative diabetic retinopathy without macular edema, unspecified eye: Secondary | ICD-10-CM

## 2015-12-20 MED ORDER — ONETOUCH ULTRASOFT LANCETS MISC: Status: DC

## 2015-12-20 MED ORDER — ONETOUCH ULTRA 2 W/DEVICE KIT: 1.0000 | PACK | Freq: Two times a day (BID) | Status: DC

## 2015-12-20 MED ORDER — GLUCOSE BLOOD VI STRP: ORAL_STRIP | Status: DC

## 2015-12-20 NOTE — Telephone Encounter (Signed)
Received fax from [facility] stating that patient's insurance will not cover [facility] supplies.  Insurance will cover OneTouch DM supplies.  Please send in new Rx for OneTouch diabetic supplies.  Derl Barrow, RN

## 2015-12-20 NOTE — Telephone Encounter (Signed)
Rx for onetouch supplies sent to pharmacy

## 2015-12-23 ENCOUNTER — Telehealth: Payer: Self-pay | Admitting: *Deleted

## 2015-12-23 DIAGNOSIS — E113299 Type 2 diabetes mellitus with mild nonproliferative diabetic retinopathy without macular edema, unspecified eye: Secondary | ICD-10-CM

## 2015-12-23 DIAGNOSIS — E1165 Type 2 diabetes mellitus with hyperglycemia: Principal | ICD-10-CM

## 2015-12-23 MED ORDER — ONETOUCH DELICA LANCETS 33G MISC: 1.0000 | Freq: Two times a day (BID) | Status: DC

## 2015-12-23 NOTE — Telephone Encounter (Signed)
Received fax from pharmacy requesting One Touch Delica Lancets. Jazmin Hartsell,CMA

## 2015-12-31 ENCOUNTER — Other Ambulatory Visit: Payer: Self-pay | Admitting: Family Medicine

## 2015-12-31 DIAGNOSIS — E119 Type 2 diabetes mellitus without complications: Secondary | ICD-10-CM

## 2016-01-02 ENCOUNTER — Telehealth: Payer: Self-pay | Admitting: Family Medicine

## 2016-01-02 NOTE — Telephone Encounter (Signed)
Patient waiting for refill on her metformin.  Sent by pharmacy on Tuesday.

## 2016-01-02 NOTE — Telephone Encounter (Signed)
Pt is calling to check on her request for a refill on Metformin. This was requested on 12/31/15. She is not out of the medication and really needs this to be called in. jw

## 2016-01-09 ENCOUNTER — Other Ambulatory Visit: Payer: Self-pay | Admitting: Family Medicine

## 2016-01-16 ENCOUNTER — Encounter: Payer: Self-pay | Admitting: Family Medicine

## 2016-01-16 ENCOUNTER — Ambulatory Visit (INDEPENDENT_AMBULATORY_CARE_PROVIDER_SITE_OTHER): Payer: BC Managed Care – PPO | Admitting: Family Medicine

## 2016-01-16 VITALS — BP 139/72 | HR 82 | Temp 97.9°F | Ht 67.0 in | Wt 154.0 lb

## 2016-01-16 DIAGNOSIS — E119 Type 2 diabetes mellitus without complications: Secondary | ICD-10-CM

## 2016-01-16 DIAGNOSIS — E113299 Type 2 diabetes mellitus with mild nonproliferative diabetic retinopathy without macular edema, unspecified eye: Secondary | ICD-10-CM | POA: Diagnosis not present

## 2016-01-16 DIAGNOSIS — E1165 Type 2 diabetes mellitus with hyperglycemia: Secondary | ICD-10-CM

## 2016-01-16 LAB — POCT GLYCOSYLATED HEMOGLOBIN (HGB A1C): HEMOGLOBIN A1C: 9.4

## 2016-01-16 MED ORDER — SITAGLIPTIN PHOSPHATE 100 MG PO TABS: 100.0000 mg | ORAL_TABLET | Freq: Every day | ORAL | Status: DC

## 2016-01-16 NOTE — Patient Instructions (Signed)
It is very important that we get your diabetes under control to decrease your risk of heart attack, stroke, blindness and other complications.  I am very happy to hear that you are increasing your physical activity.  Please restart the [place] as soon as you can and start keeping track of your blood sugar at least once a day. Bring your blood sugar log to your next appointment for me to review.

## 2016-01-18 NOTE — Assessment & Plan Note (Signed)
Poorly controlled DM, out of care for ~18 months, A1c 9.4 today. Not compliant with [place] due to cost - extensive discussion about need to control dm to prevent serious complications - continue metformin and glipize - restart [place], pt agrees to make this a priority - pt to start checking cbgs and bring log to appt next month - working hard to cut sweets an sodas an has recently started walking daily - discussed that insulin or other injectible meds may be needed if we are not able to get A1c down with orals

## 2016-01-18 NOTE — Progress Notes (Signed)
   Subjective:    Patient ID: Amanda Shepherd, female    DOB: 03/27/1951, 65 y.o.   MRN: [id number]  Diabetes   Pt presents for f/u of diabetes  CHRONIC DIABETES  Disease Monitoring  Blood Sugar Ranges: not checking regularly usually 100-200 when she does  Polyuria: no   Visual problems: no   Medication Compliance: no, stopped [place] because of expense  Medication Side Effects  Hypoglycemia: no  Preventitive Health Care  Eye Exam: pt to schedule visit  Foot Exam: next visit  Diet pattern: drinking lots of sodas again  Exercise: walking more frequently   Review of Systems See HPI    Objective:   Physical Exam  Constitutional: She is oriented to person, place, and time. She appears well-developed and well-nourished. No distress.  HENT:  Head: Normocephalic and atraumatic.  Eyes: Conjunctivae are normal. Right eye exhibits no discharge. Left eye exhibits no discharge. No scleral icterus.  Cardiovascular: Normal rate.   Pulmonary/Chest: Effort normal.  Abdominal: Soft. Bowel sounds are normal. She exhibits no distension. There is no tenderness.  Genitourinary: No breast swelling, tenderness, discharge or bleeding.  Neurological: She is alert and oriented to person, place, and time.  Skin: Skin is warm and dry. She is not diaphoretic.  Psychiatric: She has a normal mood and affect. Her behavior is normal.  Nursing note and vitals reviewed.         Assessment & Plan:  DM (diabetes mellitus), type 2, uncontrolled Poorly controlled DM, out of care for ~18 months, A1c 9.4 today. Not compliant with [place] due to cost - extensive discussion about need to control dm to prevent serious complications - continue metformin and glipize - restart [place], pt agrees to make this a priority - pt to start checking cbgs and bring log to appt next month - working hard to cut sweets an sodas an has recently started walking daily - discussed that insulin or other injectible meds may  be needed if we are not able to get A1c down with orals

## 2016-03-02 ENCOUNTER — Other Ambulatory Visit: Payer: Self-pay | Admitting: Family Medicine

## 2016-03-02 DIAGNOSIS — E119 Type 2 diabetes mellitus without complications: Secondary | ICD-10-CM

## 2016-03-03 NOTE — Telephone Encounter (Signed)
2nd request.  Diesha Rostad L, RN  

## 2016-03-09 ENCOUNTER — Other Ambulatory Visit: Payer: Self-pay | Admitting: Family Medicine

## 2016-06-03 ENCOUNTER — Other Ambulatory Visit: Payer: Self-pay | Admitting: *Deleted

## 2016-06-03 MED ORDER — LISINOPRIL 10 MG PO TABS: 10.0000 mg | ORAL_TABLET | Freq: Every day | ORAL | 3 refills | Status: DC

## 2016-06-20 ENCOUNTER — Emergency Department (HOSPITAL_COMMUNITY): Payer: BC Managed Care – PPO

## 2016-06-20 ENCOUNTER — Encounter (HOSPITAL_COMMUNITY): Payer: Self-pay

## 2016-06-20 ENCOUNTER — Emergency Department (HOSPITAL_COMMUNITY)
Admission: EM | Admit: 2016-06-20 | Discharge: 2016-06-20 | Disposition: A | Discharge status: Home / Self Care | Payer: BC Managed Care – PPO | Attending: Emergency Medicine | Admitting: Emergency Medicine

## 2016-06-20 DIAGNOSIS — Z791 Long term (current) use of non-steroidal anti-inflammatories (NSAID): Secondary | ICD-10-CM | POA: Insufficient documentation

## 2016-06-20 DIAGNOSIS — Z79899 Other long term (current) drug therapy: Secondary | ICD-10-CM | POA: Insufficient documentation

## 2016-06-20 DIAGNOSIS — Z7982 Long term (current) use of aspirin: Secondary | ICD-10-CM | POA: Diagnosis not present

## 2016-06-20 DIAGNOSIS — J40 Bronchitis, not specified as acute or chronic: Secondary | ICD-10-CM | POA: Insufficient documentation

## 2016-06-20 DIAGNOSIS — R059 Cough, unspecified: Secondary | ICD-10-CM

## 2016-06-20 DIAGNOSIS — Z7984 Long term (current) use of oral hypoglycemic drugs: Secondary | ICD-10-CM | POA: Insufficient documentation

## 2016-06-20 DIAGNOSIS — I10 Essential (primary) hypertension: Secondary | ICD-10-CM | POA: Insufficient documentation

## 2016-06-20 DIAGNOSIS — E119 Type 2 diabetes mellitus without complications: Secondary | ICD-10-CM | POA: Diagnosis not present

## 2016-06-20 DIAGNOSIS — R05 Cough: Secondary | ICD-10-CM | POA: Diagnosis present

## 2016-06-20 HISTORY — DX: Bronchitis, not specified as acute or chronic: J40

## 2016-06-20 MED ORDER — IPRATROPIUM-ALBUTEROL 0.5-2.5 (3) MG/3ML IN SOLN
3.0000 mL | Freq: Once | RESPIRATORY_TRACT | Status: AC
  Administered 2016-06-20: 3 mL via RESPIRATORY_TRACT
  Filled 2016-06-20: qty 3

## 2016-06-20 MED ORDER — PANTOPRAZOLE SODIUM 40 MG PO TBEC
40.0000 mg | DELAYED_RELEASE_TABLET | Freq: Once | ORAL | Status: AC
  Administered 2016-06-20: 40 mg via ORAL
  Filled 2016-06-20: qty 1

## 2016-06-20 MED ORDER — HYDROCODONE-HOMATROPINE 5-1.5 MG/5ML PO SYRP: 5.0000 mL | ORAL_SOLUTION | Freq: Four times a day (QID) | ORAL | 0 refills | Status: DC | PRN

## 2016-06-20 MED ORDER — PANTOPRAZOLE SODIUM 20 MG PO TBEC
20.0000 mg | DELAYED_RELEASE_TABLET | Freq: Every day | ORAL | 0 refills | Status: DC
Start: 2016-06-20 — End: 2016-12-18

## 2016-06-20 MED ORDER — AZITHROMYCIN 250 MG PO TABS: 250.0000 mg | ORAL_TABLET | Freq: Every day | ORAL | 0 refills | Status: DC

## 2016-06-20 NOTE — Discharge Instructions (Signed)
Read the information below.  Use the prescribed medication as directed.  Please discuss all new medications with your pharmacist.  You may return to the Emergency Department at any time for worsening condition or any new symptoms that concern you.   If you develop chest pain, worsening shortness of breath, high fevers that do not improve with tylenol or ibuprofen, you pass out, or become weak or dizzy, return to the ER for a recheck.

## 2016-06-20 NOTE — ED Triage Notes (Signed)
She c/o persistent cough, for which she was prescribed Prednisone "still no better".  She is in no distress. Her skin is normal, warm and dry and she is minimally short of breath with very fine end exp. Wheezes bilat. Per auscultation.

## 2016-06-20 NOTE — ED Provider Notes (Signed)
[facility] DEPT Provider Note   CSN: [id number] Arrival date & time: 06/20/16  1504  By signing my name below, I, Reola Mosher, attest that this documentation has been prepared under the direction and in the presence of [facility], PA-C.  Electronically Signed: Reola Mosher, ED Scribe. 06/20/16. 4:49 PM.  History   Chief Complaint Chief Complaint  Patient presents with  . Cough   The history is provided by the patient. No language interpreter was used.   HPI Comments: Amanda Shepherd is a 65 y.o. female with a PMHx HTN, DM, seasonal allergies, recurrent bronchitis, and HLD, who presents to the Emergency Department complaining of gradually worsening, persistent productive cough w/ green and white sputum x ~2 weeks. She reports associated fatigue, fevers, chills, upper chest tightness, rhinorrhea, congestion, post-nasal drip, and generalized myalgias. She additionally notes that ~1 week ago she had an episode of SOB in the evening which resolved after a few hours, but has not had an episode since. No SOB while in the ED. Pt reports that she has a hx of recurrent bronchitis and seasonal allergies around this time of year, and states that her symptoms today feel similar to her previous episodes. Pt was seen by her PCP who prescribed her a 12-day burst of Prednisone which she has been taking for the past 10 days with minimal relief of her sx. Pt further notes that directly after taking her Prednisone tablets that her cough is exacerbated. Pt has also has been taking Mucinex and otc cough syrup intermittently which moderately suppress her cough, but notes that it always returns. She states that her symptoms are also exacerbated with mild activity and talking. Her cough is alleviated by laying flat. No hx of GERD. Denies hemoptysis, chest pain, or sinus pressure.   PCP: Trilby Drummer, MD  Past Medical History:  Diagnosis Date  . Bronchitis   . Diabetes mellitus   . Hyperlipidemia      Patient Active Problem List   Diagnosis Date Noted  . Bronchitis   . Mild nonproliferative diabetic retinopathy(362.04) 06/04/2014  . Health care maintenance 01/14/2012  . HYPERTENSION 09/22/2010  . DM (diabetes mellitus), type 2, uncontrolled ([place]) 02/14/2007  . HYPERLIPIDEMIA 02/14/2007   Past Surgical History:  Procedure Laterality Date  . ABDOMINAL HYSTERECTOMY     OB History    No data available     Home Medications    Prior to Admission medications   Medication Sig Start Date End Date Taking? Authorizing Provider  aspirin EC 81 MG tablet Take 81 mg by mouth daily.    Historical Provider, MD  azithromycin (ZITHROMAX) 250 MG tablet Take 1 tablet (250 mg total) by mouth daily. Take first 2 tablets together, then 1 every day until finished. 06/20/16   Clayton Bibles, PA-C  benzonatate (TESSALON PERLES) 100 MG capsule Take 1-2 capsules (100-200 mg total) by mouth 3 (three) times daily as needed for cough. 01/01/15   Waldemar Dickens, MD  Blood Glucose Monitoring Suppl (ONE TOUCH ULTRA 2) w/Device KIT 1 Device by Does not apply route 2 (two) times daily. 12/20/15   Frazier Richards, MD  cefUROXime (CEFTIN) 250 MG tablet Take 1 tablet (250 mg total) by mouth 2 (two) times daily with a meal. 04/23/14   Orpah Greek, MD  chlorpheniramine-HYDROcodone (TUSSIONEX PENNKINETIC ER) 10-8 MG/5ML LQCR Take 2.5-5 mLs by mouth at bedtime as needed for cough. 01/01/15   Waldemar Dickens, MD  fluticasone (FLONASE) 50 MCG/ACT nasal spray  Place 2 sprays into both nostrils at bedtime. 01/01/15   Waldemar Dickens, MD  glipiZIDE (GLUCOTROL) 10 MG tablet TAKE TWO TABLETS BY MOUTH TWICE DAILY BEFORE  A  MEAL 03/03/16   Frazier Richards, MD  glucose blood (ONE TOUCH ULTRA TEST) test strip Check sugar 2 x daily 12/20/15   Frazier Richards, MD  HYDROcodone-acetaminophen (NORCO/VICODIN) 5-325 MG per tablet Take 2 tablets by mouth every 4 (four) hours as needed for moderate pain. 04/23/14   Orpah Greek, MD   HYDROcodone-homatropine [facility]) 5-1.5 MG/5ML syrup Take 5 mLs by mouth every 6 (six) hours as needed for cough. 06/20/16   Clayton Bibles, PA-C  ibuprofen (ADVIL,MOTRIN) 600 MG tablet Take 1 tablet (600 mg total) by mouth every 6 (six) hours as needed for mild pain or moderate pain. 10/18/13   Clayton Bibles, PA-C  ipratropium (ATROVENT) 0.06 % nasal spray Place 2 sprays into both nostrils 4 (four) times daily. 01/01/15   Waldemar Dickens, MD  lisinopril (PRINIVIL,ZESTRIL) 10 MG tablet Take 1 tablet (10 mg total) by mouth daily. 06/03/16   Elberta Leatherwood, MD  metFORMIN (GLUCOPHAGE) 1000 MG tablet Take 1 tablet (1,000 mg total) by mouth 2 (two) times daily with a meal. 08/21/14   Frazier Richards, MD  metFORMIN (GLUCOPHAGE) 1000 MG tablet TAKE ONE TABLET BY MOUTH TWICE DAILY WITH MEALS 01/03/16   Frazier Richards, MD  ondansetron (ZOFRAN) 4 MG tablet Take 1 tablet (4 mg total) by mouth every 6 (six) hours. 04/23/14   Orpah Greek, MD  [facility] DELICA LANCETS 93G MISC 1 Device by Does not apply route 2 (two) times daily. 12/23/15   Frazier Richards, MD  pantoprazole (PROTONIX) 20 MG tablet Take 1 tablet (20 mg total) by mouth daily. 06/20/16   Clayton Bibles, PA-C  sitaGLIPtin (JANUVIA) 100 MG tablet Take 1 tablet (100 mg total) by mouth daily. 01/16/16   Frazier Richards, MD   Family History Family History  Problem Relation Age of Onset  . Hypertension Mother    Social History Social History  Substance Use Topics  . Smoking status: Never Smoker  . Smokeless tobacco: Never Used  . Alcohol use No   Allergies   Atorvastatin  Review of Systems Review of Systems  Constitutional: Positive for chills, fatigue and fever.  HENT: Positive for congestion, postnasal drip and rhinorrhea. Negative for sinus pressure.        Negative for hemoptysis.   Respiratory: Positive for cough (productive) and chest tightness. Negative for shortness of breath.   Cardiovascular: Negative for chest pain.  Musculoskeletal: Positive for  myalgias (generalized).  Skin: Negative for rash.  Allergic/Immunologic: Positive for immunocompromised state.   Physical Exam Updated Vital Signs BP 148/88 (BP Location: Left Arm)   Pulse 90   Temp 98.2 F (36.8 C) (Oral)   Resp 18   SpO2 100% Comment: post breathing treatment  Physical Exam  Constitutional: She appears well-developed and well-nourished. No distress.  Persistent cough on exam.   HENT:  Head: Normocephalic and atraumatic.  Mouth/Throat: Oropharynx is clear and moist. No oropharyngeal exudate.  Eyes: Conjunctivae are normal.  Neck: Neck supple.  Cardiovascular: Normal rate and regular rhythm.   Pulmonary/Chest: Effort normal. No respiratory distress. She has no wheezes. She has no rales.  Abdominal: Soft. She exhibits no distension. There is no tenderness. There is no rebound and no guarding.  Musculoskeletal: She exhibits no edema.  Neurological: She is alert.  Skin: She is  not diaphoretic.  Nursing note and vitals reviewed.  ED Treatments / Results  DIAGNOSTIC STUDIES: Oxygen Saturation is 99% on RA, normal by my interpretation.   COORDINATION OF CARE: 4:44 PM-Discussed next steps with pt. Pt verbalized understanding and is agreeable with the plan.   Labs (all labs ordered are listed, but only abnormal results are displayed) Labs Reviewed - No data to display  Radiology Dg Chest 2 View  Result Date: 06/20/2016 CLINICAL DATA:  Cough x3 weeks, history of bronchitis EXAM: CHEST  2 VIEW COMPARISON:  01/25/2015 FINDINGS: Lungs are clear.  No pleural effusion or pneumothorax. The heart is normal size. Visualized osseous structures are within normal limits. IMPRESSION: Normal chest radiographs. Electronically Signed   By: Julian Hy M.D.   On: 06/20/2016 17:00    Procedures Procedures (including critical care time)  Medications Ordered in ED Medications  ipratropium-albuterol (DUONEB) 0.5-2.5 (3) MG/3ML nebulizer solution 3 mL (3 mLs Nebulization  Given 06/20/16 1655)  pantoprazole (PROTONIX) EC tablet 40 mg (40 mg Oral Given 06/20/16 1655)    Initial Impression / Assessment and Plan / ED Course  I have reviewed the triage vital signs and the nursing notes.  Pertinent labs & imaging results that were available during my care of the patient were reviewed by me and considered in my medical decision making (see chart for details).  Clinical Course   Afebrile, nontoxic patient with constellation of symptoms suggestive of prolonged viral syndrome. Pt is diabetic.  No concerning findings on exam.  Pt is also having increased coughing with taking prednisone pill and with eating, possibly related to acid reflux. Pt feeling great improvement following treatment in ED.  Lungs CTAB prior to discharge.  Discharged home with Z-pak, protonix, cough medication PCP follow up.  Pt has albuterol inhaler, has not been using it.  Discussed result, findings, treatment, and follow up  with patient.  Pt given return precautions.  Pt verbalizes understanding and agrees with plan.       Final Clinical Impressions(s) / ED Diagnoses   Final diagnoses:  Bronchitis  Cough    New Prescriptions Discharge Medication List as of 06/20/2016  5:21 PM    START taking these medications   Details  azithromycin (ZITHROMAX) 250 MG tablet Take 1 tablet (250 mg total) by mouth daily. Take first 2 tablets together, then 1 every day until finished., Starting Sat 06/20/2016, Print    HYDROcodone-homatropine (HYCODAN) 5-1.5 MG/5ML syrup Take 5 mLs by mouth every 6 (six) hours as needed for cough., Starting Sat 06/20/2016, Print    pantoprazole (PROTONIX) 20 MG tablet Take 1 tablet (20 mg total) by mouth daily., Starting Sat 06/20/2016, Print        I personally performed the services described in this documentation, which was scribed in my presence. The recorded information has been reviewed and is accurate.     Clayton Bibles, PA-C 06/20/16 1837    Sherwood Gambler, MD 06/21/16  [phone]

## 2016-08-07 ENCOUNTER — Other Ambulatory Visit: Payer: Self-pay | Admitting: Family Medicine

## 2016-08-07 DIAGNOSIS — E119 Type 2 diabetes mellitus without complications: Secondary | ICD-10-CM

## 2016-08-07 MED ORDER — METFORMIN HCL 1000 MG PO TABS: 1000.0000 mg | ORAL_TABLET | Freq: Two times a day (BID) | ORAL | 11 refills | Status: DC

## 2016-08-07 MED ORDER — GLIPIZIDE 10 MG PO TABS: ORAL_TABLET | ORAL | 11 refills | Status: DC

## 2016-08-07 NOTE — Telephone Encounter (Signed)
Pt needs a refill on metformin and glipizide. Pt uses [facility]. Please advise. Thanks! ep

## 2016-11-16 ENCOUNTER — Telehealth: Payer: Self-pay | Admitting: Family Medicine

## 2016-11-16 NOTE — Telephone Encounter (Signed)
Appointment scheduled for 12/18/2016 at 4pm. Patient has overdue HM (A1C etc.)

## 2016-12-18 ENCOUNTER — Encounter: Payer: Self-pay | Admitting: Family Medicine

## 2016-12-18 ENCOUNTER — Ambulatory Visit (INDEPENDENT_AMBULATORY_CARE_PROVIDER_SITE_OTHER): Payer: BC Managed Care – PPO | Admitting: Family Medicine

## 2016-12-18 VITALS — BP 118/50 | HR 92 | Temp 98.1°F | Ht 67.0 in | Wt 153.4 lb

## 2016-12-18 DIAGNOSIS — E1165 Type 2 diabetes mellitus with hyperglycemia: Secondary | ICD-10-CM | POA: Diagnosis not present

## 2016-12-18 DIAGNOSIS — E113299 Type 2 diabetes mellitus with mild nonproliferative diabetic retinopathy without macular edema, unspecified eye: Secondary | ICD-10-CM | POA: Diagnosis not present

## 2016-12-18 LAB — POCT GLYCOSYLATED HEMOGLOBIN (HGB A1C): Hemoglobin A1C: 10.8

## 2016-12-18 NOTE — Progress Notes (Signed)
FU

## 2016-12-18 NOTE — Patient Instructions (Signed)
It was a pleasure seeing you today in our clinic. Today we discussed your diabetes. Here is the treatment plan we have discussed and agreed upon together:   - Over the next 3 months I would like to have you really focus on reducing the amount of soda you are drinking throughout the day. Do your best to avoid all "nondiet" sodas. Try to replace your soda intake with water. - Continue taking your medications as prescribed. - Stay active. Do your best to exercise for at least 30 minutes a day 4 days a week. - I would like to see you back in 3 months to recheck your A1c.

## 2016-12-18 NOTE — Progress Notes (Signed)
   HPI  CC: Diabetes check Patient is here for a diabetes check. Patient says that she has been avoiding coming to the doctor due to feeling guilty about her diabetic control. She states that she has been compliant with all of her medications however she states that she has not been compliant with her diet. She states that she has been drinking "a lot" of soda and she knows that this is negatively affecting her A1c. She denies any symptoms of hypoglycemia. She endorses polydipsia and polyuria. Denies any headaches, fever, shortness of breath, chest pain, nausea, vomiting, diarrhea, dysuria, or vaginal discharge. She denies any peripheral edema, weakness, numbness, or paresthesias.  Outside of her suspected poorly controlled diabetes patient states that she has no other issues to discuss today.  Review of Systems See HPI for ROS.   CC, SH/smoking status, and VS noted  Objective: BP (!) 118/50   Pulse 92   Temp 98.1 F (36.7 C) (Oral)   Ht 5\' 7"  (1.702 m)   Wt 153 lb 6.4 oz (69.6 kg)   SpO2 98%   BMI 24.03 kg/m  Gen: NAD, alert, cooperative, and pleasant. CV: RRR, no murmur Resp: CTAB, no wheezes, non-labored Ext: No edema, warm, strength 5/5, DTRs +2, pulses equal bilaterally. Neuro: Alert and oriented, Speech clear, No gross deficits, sensation intact throughout.   Assessment and plan:  DM (diabetes mellitus), type 2, uncontrolled Worsening: Patient is being seen for her diabetes. Last visit was 11 months ago. A1c 10.8. She endorses good compliance with medications but poor compliance with diet. We had a long discussion about proper diabetic control and goals of care. - Continue metformin and glipizide as prescribed. - Patient is to greatly limit all "regular" soda from her diet. - Patient is to exercise for at least 30 minutes daily, 3-4 times a week. - Follow-up in 3 months for repeat A1c  Next: Focusing on diet for now. Patient seems motivated when provider pushes. Patient  states that she is "scared of needles". Had tried Januvia in the past but discontinued use due to this medication "dropping me too low". Patient will also need Pap smear and other healthcare maintenance issues addressed at next visit.   Orders Placed This Encounter  Procedures  . HgB A1c    Elberta Leatherwood, MD,MS,  PGY3 12/18/2016 5:37 PM

## 2016-12-18 NOTE — Assessment & Plan Note (Signed)
Worsening: Patient is being seen for her diabetes. Last visit was 11 months ago. A1c 10.8. She endorses good compliance with medications but poor compliance with diet. We had a long discussion about proper diabetic control and goals of care. - Continue metformin and glipizide as prescribed. - Patient is to greatly limit all "regular" soda from her diet. - Patient is to exercise for at least 30 minutes daily, 3-4 times a week. - Follow-up in 3 months for repeat A1c  Next: Focusing on diet for now. Patient seems motivated when provider pushes. Patient states that she is "scared of needles". Had tried Januvia in the past but discontinued use due to this medication "dropping me too low". Patient will also need Pap smear and other healthcare maintenance issues addressed at next visit.

## 2017-01-12 ENCOUNTER — Other Ambulatory Visit: Payer: Self-pay | Admitting: Family Medicine

## 2017-01-12 DIAGNOSIS — Z1231 Encounter for screening mammogram for malignant neoplasm of breast: Secondary | ICD-10-CM

## 2017-01-29 ENCOUNTER — Ambulatory Visit
Admission: RE | Admit: 2017-01-29 | Discharge: 2017-01-29 | Disposition: A | Discharge status: Home / Self Care | Payer: BC Managed Care – PPO | Source: Ambulatory Visit | Attending: Family Medicine | Admitting: Family Medicine

## 2017-01-29 DIAGNOSIS — Z1231 Encounter for screening mammogram for malignant neoplasm of breast: Secondary | ICD-10-CM

## 2017-03-17 ENCOUNTER — Ambulatory Visit (INDEPENDENT_AMBULATORY_CARE_PROVIDER_SITE_OTHER): Payer: BC Managed Care – PPO | Admitting: Internal Medicine

## 2017-03-17 ENCOUNTER — Encounter: Payer: Self-pay | Admitting: Internal Medicine

## 2017-03-17 VITALS — BP 122/70 | HR 80 | Temp 98.1°F | Ht 67.0 in | Wt 149.0 lb

## 2017-03-17 DIAGNOSIS — Z1211 Encounter for screening for malignant neoplasm of colon: Secondary | ICD-10-CM

## 2017-03-17 DIAGNOSIS — R197 Diarrhea, unspecified: Secondary | ICD-10-CM | POA: Diagnosis not present

## 2017-03-17 NOTE — Progress Notes (Signed)
   [facility] Phone: [phone]  Subjective:  Amanda Shepherd is a 66 year old female presenting to clinic for diarrhea for the last 2-2.5 weeks. The diarrhea is not occurring every day, but is occurring most days of the week. She is typically having 1 episode of diarrhea per day, but will sometimes have 3 episodes in a row. She has noticed recent decrease in appetite and some intermittent fatigue. No nausea, no vomiting. She states this has happened to her in the past and it got better on its own. She denies any recent changes in medications or diet. She has not been on antibiotics recently. She has tried [employer], which has helped a little. She does note that she has been to a lot of barbeques lately and her sister (who she lives with) has also had diarrhea over the same period of time. No hematochezia, no melena. No abdominal pain. No recent travel. No fevers, no chills. Of note, her last colonoscopy was in 2006 and was normal.  ROS: See HPI for pertinent positives and negatives  Past Medical History- HTN, T2DM, HLD  Family history reviewed for today's visit. No changes.  Social history- patient is a never smoker  Objective: BP 122/70   Pulse 80   Temp 98.1 F (36.7 C) (Oral)   Ht 5\' 7"  (1.702 m)   Wt 149 lb (67.6 kg)   SpO2 98%   BMI 23.34 kg/m  Gen: NAD, alert, cooperative with exam HEENT: NCAT, EOMI, MMM Neck: FROM, supple CV: RRR, no murmur Resp: CTABL, no wheezes, normal work of breathing GI: +BS, soft, non-tender, non-distended, no masses  Assessment/Plan: Diarrhea: Has been going on for 2-2.5 weeks. No associated abdominal pain, nausea, or vomiting. Occurring most days of the week, but not every day. Noticed that the diarrhea occurred after going to a lot of barbeques and also noted that her sister has had diarrhea over the same period of time. Last colonoscopy was in 2006 and was normal. Think this may be a viral gastroenteritis. Could also be IBS, but it  sounds like this has only occurred one other time in her life. - Given a lack of red flags and that her symptoms are mild, will watch and see if she gets better on her own - Discussed the importance of staying well hydrated - Advised a bland diet to give her GI tract time to heal - Referral placed to GI for colonoscopy - Return precautions discussed - Follow-up with PCP if no improvement over the last 1-2 weeks. If her symptoms persist, would consider getting CBC, stool studies, etc   Hyman Bible, MD PGY-2

## 2017-03-17 NOTE — Patient Instructions (Signed)
It was so nice to meet you! I think you probably had a viral infection of your stomach and intestines. I think your diarrhea has continued because you have been eating fatty foods and your intestines have not had time to recover. Please make sure you are eating a bland diet and drinking plenty of water over the next few weeks. Your diarrhea should continue to get better.   If you have not noticed any improvement over the next 2-3 weeks, please schedule an appointment with your primary care doctor.  - Dr. Letitia Libra Diet A bland diet consists of foods that do not have a lot of fat or fiber. Foods without fat or fiber are easier for the body to digest. They are also less likely to irritate your mouth, throat, stomach, and other parts of your gastrointestinal tract. A bland diet is sometimes called a BRAT diet. What is my plan? Your health care provider or dietitian may recommend specific changes to your diet to prevent and treat your symptoms, such as:  Eating small meals often.  Cooking food until it is soft enough to chew easily.  Chewing your food well.  Drinking fluids slowly.  Not eating foods that are very spicy, sour, or fatty.  Not eating citrus fruits, such as oranges and grapefruit. What do I need to know about this diet?  Eat a variety of foods from the bland diet food list.  Do not follow a bland diet longer than you have to.  Ask your health care provider whether you should take vitamins. What foods can I eat? Grains   Hot cereals, such as cream of wheat. Bread, crackers, or tortillas made from refined white flour. Rice. Vegetables  Canned or cooked vegetables. Mashed or boiled potatoes. Fruits  Bananas. Applesauce. Other types of cooked or canned fruit with the skin and seeds removed, such as canned peaches or pears. Meats and Other Protein Sources  Scrambled eggs. Creamy peanut butter or other nut butters. Lean, well-cooked meats, such as chicken or fish. Tofu.  Soups or broths. Dairy  Low-fat dairy products, such as milk, cottage cheese, or yogurt. Beverages  Water. Herbal tea. Apple juice. Sweets and Desserts  Pudding. Custard. Fruit gelatin. Ice cream. Fats and Oils  Mild salad dressings. Canola or olive oil. The items listed above may not be a complete list of allowed foods or beverages. Contact your dietitian for more options.  What foods are not recommended? Foods and ingredients that are often not recommended include:  Spicy foods, such as hot sauce or salsa.  Fried foods.  Sour foods, such as pickled or fermented foods.  Raw vegetables or fruits, especially citrus or berries.  Caffeinated drinks.  Alcohol.  Strongly flavored seasonings or condiments. The items listed above may not be a complete list of foods and beverages that are not allowed. Contact your dietitian for more information.  This information is not intended to replace advice given to you by your health care provider. Make sure you discuss any questions you have with your health care provider. Document Released: 01/27/2016 Document Revised: 03/12/2016 Document Reviewed: 10/17/2014 Elsevier Interactive Patient Education  2017 [employer].

## 2017-03-19 DIAGNOSIS — R197 Diarrhea, unspecified: Secondary | ICD-10-CM | POA: Insufficient documentation

## 2017-03-19 NOTE — Assessment & Plan Note (Signed)
Has been going on for 2-2.5 weeks. No associated abdominal pain, nausea, or vomiting. Occurring most days of the week, but not every day. Noticed that the diarrhea occurred after going to a lot of barbeques and also noted that her sister has had diarrhea over the same period of time. Last colonoscopy was in 2006 and was normal. Think this may be a viral gastroenteritis. Could also be IBS, but it sounds like this has only occurred one other time in her life. - Given a lack of red flags and that her symptoms are mild, will watch and see if she gets better on her own - Discussed the importance of staying well hydrated - Advised a bland diet to give her GI tract time to heal - Referral placed to GI for colonoscopy - Return precautions discussed - Follow-up with PCP if no improvement over the last 1-2 weeks. If her symptoms persist, would consider getting CBC, stool studies, etc

## 2017-05-31 ENCOUNTER — Other Ambulatory Visit: Payer: Self-pay | Admitting: *Deleted

## 2017-05-31 ENCOUNTER — Other Ambulatory Visit: Payer: Self-pay | Admitting: Family Medicine

## 2017-05-31 MED ORDER — GLUCOSE BLOOD VI STRP: ORAL_STRIP | 11 refills | Status: DC

## 2017-05-31 MED ORDER — ONETOUCH ULTRA 2 W/DEVICE KIT: 1.0000 | PACK | Freq: Two times a day (BID) | 0 refills | Status: DC

## 2017-05-31 NOTE — Telephone Encounter (Signed)
Patient called back requesting new glucometer as well. Hubbard Hartshorn, RN, BSN

## 2017-05-31 NOTE — Telephone Encounter (Signed)
Pt calling to request refill of:  Name of Medication(s):  Test strips, pt's have expired and sugars are reading high (300s), pt states her sugars have never been that high and pt is afraid that is because of the test strips. Last date of OV:  03-17-17 Pharmacy:  [facility]  Will route refill request to Clinic RN.  Discussed with patient policy to call pharmacy for future refills.  Also, discussed refills may take up to 48 hours to approve or deny.  Renella Cunas

## 2017-05-31 NOTE — Telephone Encounter (Signed)
Error LD

## 2017-06-06 NOTE — Progress Notes (Signed)
   Subjective:   Patient ID: Amanda Shepherd    DOB: 1950-12-27, 66 y.o. female   MRN: [id number]  CC: "Diabetes"  HPI: Amanda Shepherd is a 66 y.o. female who presents to clinic today for diabetes. Problems discussed today are as follows:  Diabetes: Patient without glucometer. States sugar levels average in the mid to high 200s. Rarely are above 400. No lows. Patient states she continues to drink Coke knowing that this does not help her glucose level. She says she "occasionally" have coke but none recently though she did have some yesterday. Patient also making attempts to walk 15-20 minutes daily around neighborhood. ROS: Denies fevers or chills, chest pain, shortness of breath, decreased motor function or sensation.  Hypertension: Patient states she does not have high blood pressure though she is on blood pressure medications. Never smoker.  Preventative health care: Several health maintenance. States she had a colonoscopy is uncertain when she handles. Says she is overdue for her Pap smear since 1999. Scheduled to see ophthalmologist soon for diabetes.  Complete ROS performed, see HPI for pertinent.  [place]: NIDDM with retinopathy, HLD, HTN. Smoking status reviewed. Medications reviewed.  Objective:   BP 130/78   Pulse (!) 58   Temp 98.6 F (37 C) (Oral)   Wt 148 lb (67.1 kg)   SpO2 95%   BMI 23.18 kg/m  Vitals and nursing note reviewed.  General: well nourished, well developed, in no acute distress with non-toxic appearance HEENT: normocephalic, atraumatic, moist mucous membranes Neck: supple, non-tender without lymphadenopathy CV: regular rate and rhythm without murmurs, rubs, or gallops, no lower extremity edema Lungs: clear to auscultation bilaterally with normal work of breathing Abdomen: soft, non-tender, non-distended, no masses or organomegaly palpable, normoactive bowel sounds Skin: warm, dry, no rashes or lesions, cap refill < 2 seconds Extremities: warm and well  perfused, normal tone  Assessment & Plan:   Uncontrolled diabetes mellitus with retinopathy (HCC) Chronic. Poorly controlled. Currently on metformin and glipizide without improvement. A1c 10.8. Good candidate for insulin therapy. --Patient to follow with Dr. Valentina Lucks to initiate insulin therapy and likely discontinue glipizide --Patient to continue monitoring glucose levels and bring to next visit --RTC one month  Preventative health care No screening for HIV for HCV by chart review. Overdue for Pap and colonoscopy. --Release of information signed to obtain colonoscopy results, will discuss Pap smear next visit --Will obtain HIV and HCV screen  Primary hypertension Chronic. Controlled. Currently on ARB. ASCVD score recommending high intensity statin. Has history of atorvastatin allergy. --Continue lisinopril 10 mg daily, ASA 81 mg daily --Would consider starting rosuvastatin 40 mg daily at next visit --We will obtain CBC with differential, CMP, TSH  Orders Placed This Encounter  Procedures  . HIV antibody  . Hepatitis C antibody  . CMP14+EGFR  . CBC with Differential/Platelet  . TSH  . POCT HgB A1C (CPT 83036)   No orders of the defined types were placed in this encounter.   Harriet Butte, [place], PGY-2 06/07/2017 12:45 PM

## 2017-06-07 ENCOUNTER — Ambulatory Visit (INDEPENDENT_AMBULATORY_CARE_PROVIDER_SITE_OTHER): Payer: BC Managed Care – PPO | Admitting: Family Medicine

## 2017-06-07 ENCOUNTER — Encounter: Payer: Self-pay | Admitting: Family Medicine

## 2017-06-07 VITALS — BP 130/78 | HR 58 | Temp 98.6°F | Wt 148.0 lb

## 2017-06-07 DIAGNOSIS — E1165 Type 2 diabetes mellitus with hyperglycemia: Secondary | ICD-10-CM | POA: Diagnosis not present

## 2017-06-07 DIAGNOSIS — Z0000 Encounter for general adult medical examination without abnormal findings: Secondary | ICD-10-CM

## 2017-06-07 DIAGNOSIS — Z114 Encounter for screening for human immunodeficiency virus [HIV]: Secondary | ICD-10-CM | POA: Diagnosis not present

## 2017-06-07 DIAGNOSIS — I10 Essential (primary) hypertension: Secondary | ICD-10-CM | POA: Diagnosis not present

## 2017-06-07 DIAGNOSIS — Z1159 Encounter for screening for other viral diseases: Secondary | ICD-10-CM

## 2017-06-07 DIAGNOSIS — E113299 Type 2 diabetes mellitus with mild nonproliferative diabetic retinopathy without macular edema, unspecified eye: Secondary | ICD-10-CM

## 2017-06-07 LAB — POCT GLYCOSYLATED HEMOGLOBIN (HGB A1C): Hemoglobin A1C: 10.8

## 2017-06-07 NOTE — Assessment & Plan Note (Addendum)
Chronic. Controlled. Currently on ARB. ASCVD score recommending high intensity statin. Has history of atorvastatin allergy. --Continue lisinopril 10 mg daily, ASA 81 mg daily --Would consider starting rosuvastatin 40 mg daily at next visit --We will obtain CBC with differential, CMP, TSH

## 2017-06-07 NOTE — Assessment & Plan Note (Addendum)
No screening for HIV for HCV by chart review. Overdue for Pap and colonoscopy. --Release of information signed to obtain colonoscopy results, will discuss Pap smear next visit --Will obtain HIV and HCV screen

## 2017-06-07 NOTE — Patient Instructions (Addendum)
Thank you for coming in to see [place] today. Please see below to review our plan for today's visit.  1. I will have you schedule an appointment with Dr. Valentina Lucks for diabetes. I believe you need insulin to better control your diabetes. Please restrict drinking coke to less than 8 oz daily. 2. I will call you if your blood results are abnormal, otherwise expect to receive them in the mail. 3. Please call your eye doctor and schedule a follow-up appointment for your diabetic eye exam. 4. We will discuss scheduling you for a final Pap smear during her next visit.  Return to clinic in 1 month.  Please call the clinic at [phone] if your symptoms worsen or you have any concerns. It was my pleasure to see you. -- Harriet Butte, [place], PGY-2  Diabetes Mellitus and Food It is important for you to manage your blood sugar (glucose) level. Your blood glucose level can be greatly affected by what you eat. Eating healthier foods in the appropriate amounts throughout the day at about the same time each day will help you control your blood glucose level. It can also help slow or prevent worsening of your diabetes mellitus. Healthy eating may even help you improve the level of your blood pressure and reach or maintain a healthy weight. General recommendations for healthful eating and cooking habits include:  Eating meals and snacks regularly. Avoid going long periods of time without eating to lose weight.  Eating a diet that consists mainly of plant-based foods, such as fruits, vegetables, nuts, legumes, and whole grains.  Using low-heat cooking methods, such as baking, instead of high-heat cooking methods, such as deep frying.  Work with your dietitian to make sure you understand how to use the Nutrition Facts information on food labels. How can food affect me? Carbohydrates Carbohydrates affect your blood glucose level more than any other type of food. Your dietitian will help  you determine how many carbohydrates to eat at each meal and teach you how to count carbohydrates. Counting carbohydrates is important to keep your blood glucose at a healthy level, especially if you are using insulin or taking certain medicines for diabetes mellitus. Alcohol Alcohol can cause sudden decreases in blood glucose (hypoglycemia), especially if you use insulin or take certain medicines for diabetes mellitus. Hypoglycemia can be a life-threatening condition. Symptoms of hypoglycemia (sleepiness, dizziness, and disorientation) are similar to symptoms of having too much alcohol. If your health care provider has given you approval to drink alcohol, do so in moderation and use the following guidelines:  Women should not have more than one drink per day, and men should not have more than two drinks per day. One drink is equal to: ? 12 oz of beer. ? 5 oz of wine. ? 1 oz of hard liquor.  Do not drink on an empty stomach.  Keep yourself hydrated. Have water, diet soda, or unsweetened iced tea.  Regular soda, juice, and other mixers might contain a lot of carbohydrates and should be counted.  What foods are not recommended? As you make food choices, it is important to remember that all foods are not the same. Some foods have fewer nutrients per serving than other foods, even though they might have the same number of calories or carbohydrates. It is difficult to get your body what it needs when you eat foods with fewer nutrients. Examples of foods that you should avoid that are high in calories and carbohydrates but  low in nutrients include:  Trans fats (most processed foods list trans fats on the Nutrition Facts label).  Regular soda.  Juice.  Candy.  Sweets, such as cake, pie, doughnuts, and cookies.  Fried foods.  What foods can I eat? Eat nutrient-rich foods, which will nourish your body and keep you healthy. The food you should eat also will depend on several factors,  including:  The calories you need.  The medicines you take.  Your weight.  Your blood glucose level.  Your blood pressure level.  Your cholesterol level.  You should eat a variety of foods, including:  Protein. ? Lean cuts of meat. ? Proteins low in saturated fats, such as fish, egg whites, and beans. Avoid processed meats.  Fruits and vegetables. ? Fruits and vegetables that may help control blood glucose levels, such as apples, mangoes, and yams.  Dairy products. ? Choose fat-free or low-fat dairy products, such as milk, yogurt, and cheese.  Grains, bread, pasta, and rice. ? Choose whole grain products, such as multigrain bread, whole oats, and brown rice. These foods may help control blood pressure.  Fats. ? Foods containing healthful fats, such as nuts, avocado, olive oil, canola oil, and fish.  Does everyone with diabetes mellitus have the same meal plan? Because every person with diabetes mellitus is different, there is not one meal plan that works for everyone. It is very important that you meet with a dietitian who will help you create a meal plan that is just right for you. This information is not intended to replace advice given to you by your health care provider. Make sure you discuss any questions you have with your health care provider. Document Released: 07/02/2005 Document Revised: 03/12/2016 Document Reviewed: 09/01/2013 Elsevier Interactive Patient Education  2017 [employer].

## 2017-06-07 NOTE — Assessment & Plan Note (Addendum)
Chronic. Poorly controlled. Currently on metformin and glipizide without improvement. A1c 10.8. Good candidate for insulin therapy. --Patient to follow with Dr. Valentina Lucks to initiate insulin therapy and likely discontinue glipizide --Patient to continue monitoring glucose levels and bring to next visit --RTC one month

## 2017-06-08 ENCOUNTER — Encounter: Payer: Self-pay | Admitting: Family Medicine

## 2017-06-08 LAB — CBC WITH DIFFERENTIAL/PLATELET
Basophils Absolute: 0 10*3/uL (ref 0.0–0.2)
Basos: 1 %
EOS (ABSOLUTE): 0.1 10*3/uL (ref 0.0–0.4)
Eos: 3 %
Hematocrit: 35.2 % (ref 34.0–46.6)
Hemoglobin: 11.9 g/dL (ref 11.1–15.9)
Immature Grans (Abs): 0 10*3/uL (ref 0.0–0.1)
Immature Granulocytes: 0 %
LYMPHS ABS: 2.6 10*3/uL (ref 0.7–3.1)
Lymphs: 51 %
MCH: 30.3 pg (ref 26.6–33.0)
MCHC: 33.8 g/dL (ref 31.5–35.7)
MCV: 90 fL (ref 79–97)
MONOS ABS: 0.3 10*3/uL (ref 0.1–0.9)
Monocytes: 5 %
NEUTROS ABS: 2 10*3/uL (ref 1.4–7.0)
Neutrophils: 40 %
Platelets: 339 10*3/uL (ref 150–379)
RBC: 3.93 x10E6/uL (ref 3.77–5.28)
RDW: 13.1 % (ref 12.3–15.4)
WBC: 5.1 10*3/uL (ref 3.4–10.8)

## 2017-06-08 LAB — CMP14+EGFR
A/G RATIO: 1.4 (ref 1.2–2.2)
ALT: 9 IU/L (ref 0–32)
AST: 19 IU/L (ref 0–40)
Albumin: 4.4 g/dL (ref 3.6–4.8)
Alkaline Phosphatase: 134 IU/L — ABNORMAL HIGH (ref 39–117)
BUN/Creatinine Ratio: 22 (ref 12–28)
BUN: 20 mg/dL (ref 8–27)
Bilirubin Total: 0.4 mg/dL (ref 0.0–1.2)
CO2: 20 mmol/L (ref 20–29)
Calcium: 9.9 mg/dL (ref 8.7–10.3)
Chloride: 105 mmol/L (ref 96–106)
Creatinine, Ser: 0.9 mg/dL (ref 0.57–1.00)
GFR calc non Af Amer: 68 mL/min/{1.73_m2} (ref >59)
GFR, EST AFRICAN AMERICAN: 78 mL/min/{1.73_m2} (ref >59)
GLOBULIN, TOTAL: 3.2 g/dL (ref 1.5–4.5)
Glucose: 129 mg/dL — ABNORMAL HIGH (ref 65–99)
POTASSIUM: 4.9 mmol/L (ref 3.5–5.2)
SODIUM: 139 mmol/L (ref 134–144)
TOTAL PROTEIN: 7.6 g/dL (ref 6.0–8.5)

## 2017-06-08 LAB — HIV ANTIBODY (ROUTINE TESTING W REFLEX): HIV SCREEN 4TH GENERATION: NONREACTIVE

## 2017-06-08 LAB — HEPATITIS C ANTIBODY: Hep C Virus Ab: <0.1 s/co ratio (ref 0.0–0.9)

## 2017-06-08 LAB — TSH: TSH: 0.624 u[IU]/mL (ref 0.450–4.500)

## 2017-06-10 ENCOUNTER — Ambulatory Visit (INDEPENDENT_AMBULATORY_CARE_PROVIDER_SITE_OTHER): Payer: BC Managed Care – PPO | Admitting: Pharmacist

## 2017-06-10 ENCOUNTER — Encounter: Payer: Self-pay | Admitting: Pharmacist

## 2017-06-10 DIAGNOSIS — E113299 Type 2 diabetes mellitus with mild nonproliferative diabetic retinopathy without macular edema, unspecified eye: Secondary | ICD-10-CM | POA: Diagnosis not present

## 2017-06-10 DIAGNOSIS — E1165 Type 2 diabetes mellitus with hyperglycemia: Secondary | ICD-10-CM

## 2017-06-10 MED ORDER — INSULIN GLARGINE 100 UNIT/ML SOLOSTAR PEN: 8.0000 [IU] | PEN_INJECTOR | Freq: Every day | SUBCUTANEOUS | 0 refills | Status: DC

## 2017-06-10 MED ORDER — INSULIN GLARGINE 100 UNIT/ML SOLOSTAR PEN: 8.0000 [IU] | PEN_INJECTOR | Freq: Every day | SUBCUTANEOUS | 3 refills | Status: DC

## 2017-06-10 MED ORDER — INSULIN PEN NEEDLE 32G X 4 MM MISC: 11 refills | Status: DC

## 2017-06-10 NOTE — Assessment & Plan Note (Signed)
Diabetes longstanding currently uncontrolled as evidenced by A1C of 10.8 and self reported CBGs . Patient denies hypoglycemic events and is able to verbalize appropriate hypoglycemia management plan. Patient reports adherence with medication. Control is suboptimal due to suboptimal therapy, likely pancreatic insufficiency, dietary indiscretion.   Added basal insulin Lantus (insulin glargine) 8 units daily. Patient educated on purpose, proper use and potential adverse effects of hypoglycemia and how to treat.  Following instruction patient verbalized understanding of treatment plan.  D/c glipizide. Continue metformin 1000 mg daily.

## 2017-06-10 NOTE — Progress Notes (Signed)
    S:     Chief Complaint  Patient presents with  . Medication Management    Diabetes    Patient arrives in good spirits, ambulating without assistance.  Presents for diabetes evaluation, education, and management at the request of Dr. Yisroel Ramming . Patient was referred on 06/07/2017.  Patient was last seen by Primary Care Provider on 06/07/2017.   Patient states that she forgets to get aspirin, otherwise compliant with meds. Didn't take glipizide today. Tests BG infrequently. Going to see eye doctor next week. Has been trying to make lifestyel improvements with diet and exercise. Expresses fear of insulin shots. Reports that the heaviest she has been is 180 lbs several years ago.   Reports being diagnosed with DM > 40 years ago (had it when she was pregnant)  Family/Social History: Father, son and daughter all have DM, sister may have pre-diabetes.   Patient reports adherence with medications.  Current diabetes medications include: Glipizide 20 mg BID, metformin 1000 mg BID Current hypertension medications include: Lisinopril 10 mg daily  Patient denies hypoglycemic events. Lowest = 74, feels 'funny' when around 150  Patient reported dietary habits: Eats 3 meals/day. 24 hr diet recall below: Breakfast: smoked sausage and slice of honeywheat bread Lunch: BBQ sandwich, baked beans, salad Dinner: taco, chips and salsa Snacks: chips Drinks: water, has cut way back on soda, some Gatorade   Patient reported exercise habits: walking 20 mins 2-3 x/week   Patient denies nocturia. Improved.  Patient denies neuropathy. Patient reports visual changes. Was blurry, now improved.  Patient reports self foot exams. No issues.   O:  Physical Exam  Constitutional: She appears well-developed and well-nourished.  Vitals reviewed.    Review of Systems  All other systems reviewed and are negative.    Lab Results  Component Value Date   HGBA1C 10.8 06/07/2017   Vitals:   06/10/17 1516    BP: 126/70  Pulse: 84    2 hour post-prandial/random CBG: 156 after, hasn't seen anything in the 200s this week, was seeing 200s last week before she quit drinking soda.  10 year ASCVD risk: 18.1% using lipids > 63 year old.   A/P: Diabetes longstanding currently uncontrolled as evidenced by A1C of 10.8 and self reported CBGs . Patient denies hypoglycemic events and is able to verbalize appropriate hypoglycemia management plan. Patient reports adherence with medication. Control is suboptimal due to suboptimal therapy, likely pancreatic insufficiency, dietary indiscretion.   Added basal insulin Lantus (insulin glargine) 8 units daily. Patient educated on purpose, proper use and potential adverse effects of hypoglycemia and how to treat.  Following instruction patient verbalized understanding of treatment plan.  D/c glipizide. Continue metformin 1000 mg daily.   Next A1C anticipated 09/07/2017.    ASCVD risk greater than 7.5%. Not on statin at this time. Counseled to restart Aspirin 81 mg  -Consider start of high-intensity statin at future visit given ASCVD risk score  Hypertension longstanding currently well controlled. Patient reports adherence with medication. -Continue lisinopril  Written patient instructions provided.  Total time in face to face counseling 45 minutes.   Follow up in Pharmacist Clinic Visit 2 weeks.   Patient seen with Cleotis Lema, PharmD Candidate, Danella Penton, PharmD Candidate, and Deirdre Pippins, PharmD, PGY2 Resident.

## 2017-06-10 NOTE — Patient Instructions (Addendum)
Thank you for coming to see [place] today!   1. STOP taking your glipizide  2. START taking lantus 8 units once a day.   3. Test your blood sugar first thing in the morning and any time you feel low. If you see a number less than 70, drink 1/2 a glass of juice or regular soda, wait 15 minutes and retest your sugar. If still less than 70, repeat the soda or juice and wait another 15 minutes. If it is still less than 70 after the second time, call 911.   3. Come back to see [place] in 2 weeks.

## 2017-06-11 ENCOUNTER — Telehealth: Payer: Self-pay | Admitting: *Deleted

## 2017-06-11 ENCOUNTER — Other Ambulatory Visit: Payer: Self-pay | Admitting: Family Medicine

## 2017-06-11 DIAGNOSIS — E1165 Type 2 diabetes mellitus with hyperglycemia: Principal | ICD-10-CM

## 2017-06-11 DIAGNOSIS — E113299 Type 2 diabetes mellitus with mild nonproliferative diabetic retinopathy without macular edema, unspecified eye: Secondary | ICD-10-CM

## 2017-06-11 MED ORDER — BASAGLAR KWIKPEN 100 UNIT/ML ~~LOC~~ SOPN: 8.0000 [IU] | PEN_INJECTOR | Freq: Every day | SUBCUTANEOUS | 3 refills | Status: DC

## 2017-06-11 NOTE — Progress Notes (Signed)
Patient ID: Amanda Shepherd, female   DOB: [birth year], 66 y.o.   MRN: [id number] Reviewed: Agree with Dr. Graylin Shiver documentation and management.

## 2017-06-11 NOTE — Telephone Encounter (Signed)
Lantus which the [place]. Rx sent to pharmacy.

## 2017-06-11 NOTE — Telephone Encounter (Signed)
Prior Authorization received from [employer] for [employer]. Formulary preferred: Kara Mead, Levemir Flextouch or Tyler Aas Flextouch. Please advise.  Derl Barrow, RN

## 2017-06-23 LAB — HM DIABETES EYE EXAM

## 2017-06-24 ENCOUNTER — Encounter: Payer: Self-pay | Admitting: Pharmacist

## 2017-06-24 ENCOUNTER — Ambulatory Visit (INDEPENDENT_AMBULATORY_CARE_PROVIDER_SITE_OTHER): Payer: BC Managed Care – PPO | Admitting: Pharmacist

## 2017-06-24 DIAGNOSIS — E113299 Type 2 diabetes mellitus with mild nonproliferative diabetic retinopathy without macular edema, unspecified eye: Secondary | ICD-10-CM

## 2017-06-24 DIAGNOSIS — I10 Essential (primary) hypertension: Secondary | ICD-10-CM | POA: Diagnosis not present

## 2017-06-24 DIAGNOSIS — E1169 Type 2 diabetes mellitus with other specified complication: Secondary | ICD-10-CM

## 2017-06-24 DIAGNOSIS — E1165 Type 2 diabetes mellitus with hyperglycemia: Secondary | ICD-10-CM | POA: Diagnosis not present

## 2017-06-24 DIAGNOSIS — E785 Hyperlipidemia, unspecified: Secondary | ICD-10-CM

## 2017-06-24 MED ORDER — METFORMIN HCL 1000 MG PO TABS: 500.0000 mg | ORAL_TABLET | Freq: Two times a day (BID) | ORAL | 11 refills | Status: DC

## 2017-06-24 MED ORDER — BASAGLAR KWIKPEN 100 UNIT/ML ~~LOC~~ SOPN: 7.0000 [IU] | PEN_INJECTOR | Freq: Every day | SUBCUTANEOUS | 3 refills | Status: DC

## 2017-06-24 MED ORDER — METFORMIN HCL ER 750 MG PO TB24: 750.0000 mg | ORAL_TABLET | Freq: Two times a day (BID) | ORAL | 1 refills | Status: DC

## 2017-06-24 NOTE — Progress Notes (Signed)
Patient ID: Amanda Shepherd, female   DOB: 1950/11/25, 66 y.o.   MRN: [id number] Reviewed: Agree with Dr. Graylin Shiver documentation and management.

## 2017-06-24 NOTE — Assessment & Plan Note (Signed)
Hypertension longstanding currently well controlled.  Patient reports adherence with medication (lisinopril). Control is acceptable at this time, Patient is admitted "salt-aholic".  Discussed healthy alternatives to salty foods.

## 2017-06-24 NOTE — Assessment & Plan Note (Signed)
Diabetes longstanding currently much improved since starting insulin. Patient denies hypoglycemic events and is able to verbalize appropriate hypoglycemia management plan. Patient reports adherence with medication. Control is now much improved since starting insulin.   Continued basal insulin Lantus/Basaglar (insulin glargine). Due to GI side effects,  Decreased dose of metformin from 1000mg  BID to 500mg  until gone and then start Metfromin XR 750mg  BID.  Next A1C anticipated 2 months.

## 2017-06-24 NOTE — Progress Notes (Signed)
    S:     Chief Complaint  Patient presents with  . Medication Management    Diabetes    Patient arrives in positive spirits and ambulating without assistance.  She was excited to share that her blood sugars are much improved since starting insulin therapy.  She states that she wishes that she had initiated insulin many years ago.  Presents for diabetes evaluation, education, and management at the request of Dr. Yisroel Ramming.  Patient was referred on 06/07/2017.  Patient was last seen by Primary Care Provider on 06/07/2017.   Patient reports Diabetes was diagnosed many years ago, possibly 40 years ago.   Family/Social History: Highly active in her church.   Patient reports adherence with medications.  Current diabetes medications include: insulin glargine (Lantus- switching to Basaglar) Current hypertension medications include: lisinopril  Patient denies hypoglycemic events.  Patient reported dietary habits: Eats 3 meals/day Breakfast:apple  Lunch: "chicken mini biscuits" Dinner: healthy  Snacks: healthy - less Drinks: cut out soda, drinking more water.   Patient reported exercise habits: she walks with her works and is very active.  No structured exercise plan currently.    Patient denies nocturia.  Once or less per night.  Now reports sleeping all night.  Patient denies neuropathy. Patient denies visual changes. Patient reports self foot exams.   O:  Physical Exam  Constitutional: She appears well-developed and well-nourished.  Psychiatric: She has a normal mood and affect. Her behavior is normal. Judgment and thought content normal.   Review of Systems  All other systems reviewed and are negative.    Lab Results  Component Value Date   HGBA1C 10.8 06/07/2017    Home fasting CBG: 88-170 2 hour post-prandial/random CBG: 130-190.  A/P: Diabetes longstanding currently much improved since starting insulin. Patient denies hypoglycemic events and is able to verbalize  appropriate hypoglycemia management plan. Patient reports adherence with medication. Control is now much improved since starting insulin.   Continued basal insulin Lantus/Basaglar (insulin glargine). Due to GI side effects,  Decreased dose of metformin from 1000mg  BID to 500mg  until gone and then start Metfromin XR 750mg  BID.  Next A1C anticipated 2 months.    ASCVD risk greater than 7.5%. Continued Aspirin 81 mg.  Discussed potential initiation of another statin (has been intolerant of atorvastatin due to memory impairment) and we agreed that reevaluation of a lipid panel in 2-3 months followed by reevaluation and consideration of drug therapy at that time.   Hypertension longstanding currently well controlled.  Patient reports adherence with medication (lisinopril). Control is acceptable at this time, Patient is admitted "salt-aholic".  Discussed healthy alternatives to salty foods.   Written patient instructions provided.  Total time in face to face counseling 40 minutes.   Follow up in Pharmacist Clinic Visit PRN, next visit with PCP - Dr. Yisroel Ramming.   Patient seen with Deirdre Pippins, PharmD, PGY2 Resident.

## 2017-06-24 NOTE — Assessment & Plan Note (Signed)
ASCVD risk greater than 7.5%. Continued Aspirin 81 mg.  Discussed potential initiation of another statin (has been intolerant of atorvastatin due to memory impairment) and we agreed that reevaluation of a lipid panel in 2-3 months followed by reevaluation and consideration of drug therapy at that time.

## 2017-06-24 NOTE — Patient Instructions (Addendum)
GREAT to see you today!  Decrease your insulin glargine to 7 units each morning.   Decrease your Metformin from 1000mg  to 500mg  twice daily by splitting tablets until gone.  Then start 750mg  XR tablets twice daily - new prescription sent to pharmacy today.   Please work on your diet and we will plan to recheck your cholesterol in 2-3 months.   Next visit with Dr. Yisroel Ramming in 1 month - please schedule on the way out.

## 2017-07-08 ENCOUNTER — Encounter: Payer: Self-pay | Admitting: Family Medicine

## 2017-07-08 ENCOUNTER — Ambulatory Visit (INDEPENDENT_AMBULATORY_CARE_PROVIDER_SITE_OTHER): Payer: BC Managed Care – PPO | Admitting: Family Medicine

## 2017-07-08 VITALS — BP 116/78 | HR 76 | Temp 98.0°F | Ht 67.0 in | Wt 145.0 lb

## 2017-07-08 DIAGNOSIS — Z1211 Encounter for screening for malignant neoplasm of colon: Secondary | ICD-10-CM | POA: Diagnosis not present

## 2017-07-08 DIAGNOSIS — E113299 Type 2 diabetes mellitus with mild nonproliferative diabetic retinopathy without macular edema, unspecified eye: Secondary | ICD-10-CM

## 2017-07-08 DIAGNOSIS — E1165 Type 2 diabetes mellitus with hyperglycemia: Secondary | ICD-10-CM

## 2017-07-08 DIAGNOSIS — Z1379 Encounter for other screening for genetic and chromosomal anomalies: Secondary | ICD-10-CM | POA: Insufficient documentation

## 2017-07-08 MED ORDER — METFORMIN HCL 500 MG PO TABS: 500.0000 mg | ORAL_TABLET | Freq: Two times a day (BID) | ORAL | 3 refills | Status: DC

## 2017-07-08 NOTE — Assessment & Plan Note (Addendum)
No colonoscopy on file. Patient unwilling to consider colonoscopy. --Fecal occult blood, immunochemical

## 2017-07-08 NOTE — Progress Notes (Signed)
   Subjective:   Patient ID: Amanda Shepherd    DOB: 01/29/1951, 66 y.o. female   MRN: [id number]  CC: "Diabetes"  HPI: Amanda Shepherd is a 66 y.o. female who presents to clinic today for diabetes. Problems discussed today are as follows:  Diabetes: Currently on insulin and metformin. CBG average 150, fasting elevated at 130-180, no highs and or lows. Patient parents having GI upset from metformin and was told by Dr. Valentina Lucks to decrease metformin to 500 mg daily, however patient was concerned due to her glucose elevated prompting her to continue 1000 mg daily. She suspects this may be due to drinking ginger ale to help with the GI upset. ROS: Denies fevers or chills, chest pain, shortness of breath, abdominal pain, decreased sensation, change of vision.  Colon cancer screening: Patient states she is not interested in a colonoscopy because one of her friends died from complications during this procedure. She states she is interested in the "alternative options for screening." ROS: Denies melena or hematochezia, feelings of fatigue or syncope.  Complete ROS performed, see HPI for pertinent.  [place]: NIDDM with retinopathy, HLD, HTN. Smoking status reviewed. Medications reviewed.  Objective:   BP 116/78 (BP Location: Left Arm, Patient Position: Sitting, Cuff Size: Normal)   Pulse 76   Temp 98 F (36.7 C) (Oral)   Ht 5\' 7"  (1.702 m)   Wt 145 lb (65.8 kg)   SpO2 97%   BMI 22.71 kg/m  Vitals and nursing note reviewed.  General: well nourished, well developed, in no acute distress with non-toxic appearance HEENT: normocephalic, atraumatic, moist mucous membranes Neck: supple, non-tender without lymphadenopathy CV: regular rate and rhythm without murmurs, rubs, or gallops, no lower extremity edema Lungs: clear to auscultation bilaterally with normal work of breathing Abdomen: soft, non-tender, non-distended, no masses or organomegaly palpable, normoactive bowel sounds Skin: warm, dry, no rashes  or lesions, cap refill < 2 seconds Extremities: warm and well perfused, normal tone  Assessment & Plan:   Colon cancer screening No colonoscopy on file. Patient unwilling to consider colonoscopy. --Fecal occult blood, immunochemical  Uncontrolled diabetes mellitus with retinopathy (HCC) Chronic. Uncontrolled. A1c 10.8. Patient clearly having GI upset secondary to metformin use. --Advised patient to continue Basilglar 7 units daily, and decrease metformin to 500 mg daily and hold on starting metformin XR --RTC one month at which point I will consider switching to metformin XR 750 mg based on response  Orders Placed This Encounter  Procedures  . Fecal occult blood, imunochemical   Meds ordered this encounter  Medications  . metFORMIN (GLUCOPHAGE) 500 MG tablet    Sig: Take 1 tablet (500 mg total) by mouth 2 (two) times daily with a meal.    Dispense:  180 tablet    Refill:  [place], DO [place], PGY-2 07/09/2017 1:44 PM

## 2017-07-08 NOTE — Patient Instructions (Signed)
Thank you for coming in to see Amanda Shepherd today. Please see below to review our plan for today's visit.  1. Continue taking your insulin after meals. Only take 500 mg daily of your metformin WITH meals. This will help decreased GI upset.  2. We will screen for colon cancer with this flood test. If it is positive than you would need a colonoscopy. 3. I would like to see you again in 2 months to revisit your diabetes.  Please call the clinic at [phone] if your symptoms worsen or you have any concerns. It was my pleasure to see you. -- Harriet Butte, [place], PGY-2

## 2017-07-09 NOTE — Assessment & Plan Note (Addendum)
Chronic. Uncontrolled. A1c 10.8. Patient clearly having GI upset secondary to metformin use. --Advised patient to continue Basilglar 7 units daily, and decrease metformin to 500 mg daily and hold on starting metformin XR --RTC one month at which point I will consider switching to metformin XR 750 mg based on response

## 2017-07-14 LAB — FECAL OCCULT BLOOD, IMMUNOCHEMICAL: Fecal Occult Bld: NEGATIVE

## 2017-07-27 ENCOUNTER — Encounter: Payer: Self-pay | Admitting: Family Medicine

## 2017-09-13 ENCOUNTER — Other Ambulatory Visit: Payer: Self-pay | Admitting: Family Medicine

## 2017-09-20 ENCOUNTER — Other Ambulatory Visit: Payer: Self-pay | Admitting: Family Medicine

## 2017-09-21 ENCOUNTER — Other Ambulatory Visit: Payer: Self-pay | Admitting: *Deleted

## 2017-09-21 MED ORDER — LISINOPRIL 10 MG PO TABS: 10.0000 mg | ORAL_TABLET | Freq: Every day | ORAL | 3 refills | Status: DC

## 2017-11-10 ENCOUNTER — Encounter: Payer: Self-pay | Admitting: Internal Medicine

## 2017-11-10 ENCOUNTER — Ambulatory Visit: Payer: BC Managed Care – PPO | Admitting: Internal Medicine

## 2017-11-10 ENCOUNTER — Other Ambulatory Visit: Payer: Self-pay

## 2017-11-10 VITALS — BP 118/70 | HR 83 | Temp 97.8°F | Ht 67.0 in | Wt 151.2 lb

## 2017-11-10 DIAGNOSIS — B9789 Other viral agents as the cause of diseases classified elsewhere: Secondary | ICD-10-CM | POA: Diagnosis not present

## 2017-11-10 DIAGNOSIS — J069 Acute upper respiratory infection, unspecified: Secondary | ICD-10-CM

## 2017-11-10 DIAGNOSIS — E11319 Type 2 diabetes mellitus with unspecified diabetic retinopathy without macular edema: Secondary | ICD-10-CM

## 2017-11-10 DIAGNOSIS — E1165 Type 2 diabetes mellitus with hyperglycemia: Secondary | ICD-10-CM

## 2017-11-10 DIAGNOSIS — IMO0002 Reserved for concepts with insufficient information to code with codable children: Secondary | ICD-10-CM

## 2017-11-10 LAB — POCT RAPID STREP A (OFFICE): RAPID STREP A SCREEN: NEGATIVE

## 2017-11-10 LAB — POCT GLYCOSYLATED HEMOGLOBIN (HGB A1C): Hemoglobin A1C: 11.2

## 2017-11-10 MED ORDER — BENZONATATE 200 MG PO CAPS: 200.0000 mg | ORAL_CAPSULE | Freq: Two times a day (BID) | ORAL | 0 refills | Status: DC | PRN

## 2017-11-10 NOTE — Patient Instructions (Signed)
It was so nice to meet you! I hope you start feeling better soon.  Your strep test was negative, so I think this is likely a viral upper respiratory infection.  I have prescribed a prescription cough medication called Tessalon. Please take this twice a day. I would also recommend that you buy Afrin over-the-counter. You should only use this for three days.  Please come back to see [place] if you are not better in the next 2 weeks.  -Dr. Brett Albino

## 2017-11-10 NOTE — Progress Notes (Signed)
   [facility] Phone: [phone]  Subjective:  Umaiza is a 67 year old female presenting to clinic with cough, sore throat, chills, and headache for the last 2 weeks. Her cough is productive of clear sputum. She has also been having clear rhinorrhea. She notes occasional congestion and facial "pressure". She has been taking Mucinex, which is not helping. She has also been taking Tussin cough medicine, which hasn't helped. Her boss tested positive for the flu several weeks ago, but she has not had any other sick contacts.   ROS: See HPI for pertinent positives and negatives  Past Medical History- HTN, HLD, uncontrolled T2DM  Family history reviewed for today's visit. No changes.  Social history- patient is a never smoker  Objective: BP 118/70   Pulse 83   Temp 97.8 F (36.6 C) (Oral)   Ht 5\' 7"  (1.702 m)   Wt 151 lb 3.2 oz (68.6 kg)   SpO2 98%   BMI 23.68 kg/m  Gen: NAD, alert, cooperative with exam HEENT: NCAT, EOMI, MMM, TMs normal bilaterally, nasal turbinates erythematous and edematous, oropharynx mildly erythematous. Neck: FROM, supple, no cervical lymphadenopathy CV: RRR, no murmur Resp: CTABL, no wheezes, normal work of breathing  Assessment/Plan: Viral URI with Cough: Patient with cough, sore throat, chills, and headache for 2 weeks. No signs of bacterial infection on exam. Rapid strep negative. - Considered giving a single dose of Decadron for pharyngitis, but blood sugars have been in the 300s, so did not give this. - Prescribed Tessalon 200mg  bid for cough - Advised that patient use Afrin x 3 days for nasal congestion - Use Tylenol prn for sore throat - Return precautions discussed - Follow-up in 2 weeks if no improvement. Appointment scheduled with PCP next month to discuss diabetes.   Hyman Bible, MD PGY-3

## 2017-11-10 NOTE — Assessment & Plan Note (Signed)
Patient with cough, sore throat, chills, and headache for 2 weeks. No signs of bacterial infection on exam. Rapid strep negative. - Considered giving a single dose of Decadron for pharyngitis, but blood sugars have been in the 300s, so did not give this. - Prescribed Tessalon 200mg  bid for cough - Advised that patient use Afrin x 3 days for nasal congestion - Use Tylenol prn for sore throat - Return precautions discussed - Follow-up in 2 weeks if no improvement. Appointment scheduled with PCP next month to discuss diabetes.

## 2017-12-01 ENCOUNTER — Ambulatory Visit: Payer: BC Managed Care – PPO | Admitting: Family Medicine

## 2017-12-14 ENCOUNTER — Ambulatory Visit (INDEPENDENT_AMBULATORY_CARE_PROVIDER_SITE_OTHER): Payer: BC Managed Care – PPO | Admitting: Family Medicine

## 2017-12-14 ENCOUNTER — Encounter: Payer: Self-pay | Admitting: Family Medicine

## 2017-12-14 ENCOUNTER — Other Ambulatory Visit: Payer: Self-pay

## 2017-12-14 VITALS — BP 118/68 | HR 84 | Temp 98.7°F | Ht 67.0 in | Wt 150.0 lb

## 2017-12-14 DIAGNOSIS — E1165 Type 2 diabetes mellitus with hyperglycemia: Secondary | ICD-10-CM

## 2017-12-14 DIAGNOSIS — E11319 Type 2 diabetes mellitus with unspecified diabetic retinopathy without macular edema: Secondary | ICD-10-CM

## 2017-12-14 DIAGNOSIS — IMO0002 Reserved for concepts with insufficient information to code with codable children: Secondary | ICD-10-CM

## 2017-12-14 MED ORDER — DAPAGLIFLOZIN PROPANEDIOL 10 MG PO TABS: 10.0000 mg | ORAL_TABLET | Freq: Every day | ORAL | 0 refills | Status: DC

## 2017-12-14 MED ORDER — METFORMIN HCL 1000 MG PO TABS
1000.0000 mg | ORAL_TABLET | Freq: Two times a day (BID) | ORAL | 3 refills | Status: DC
Start: 2017-12-14 — End: 2018-12-28

## 2017-12-14 NOTE — Assessment & Plan Note (Addendum)
Chronic.  Poorly controlled.  A1c 11.2 last month.  Recently started on basilar however patient does not like injections he would like alternative options.  We will consider oral agents today and recheck in 2 weeks.  Patient will likely need insulin at some point but will try other medications for the time being. - Increasing metformin to 1000 mg twice daily and discontinuing Basiglar - Initiating Farxiga 10 mg daily with instructions to continue checking fasting and postprandial CBGs - Discussed lifestyle modifications at length - RTC 2 weeks

## 2017-12-14 NOTE — Patient Instructions (Signed)
Thank you for coming in to see Amanda Shepherd today. Please see below to review our plan for today's visit.  I increased your metformin to 1000 mg twice daily.  Below is information on this new medication called pharmacy, which you will take daily.  It is important that you avoid excessive amounts of carbohydrates and try to achieve 150 minutes of moderate to high intensity exercise weekly to better control your diabetes.  Do not take the insulin you are prescribed until we reassess her diabetes control in 2 weeks.  In the meantime, check your glucose levels in the morning before breakfast and after meals.  Record these on a journal and bring them into your next visit.  Please call the clinic at [phone] if your symptoms worsen or you have any concerns. It was our pleasure to serve you.  Harriet Butte, [place], PGY-2    Dapagliflozin tablets What is this medicine? DAPAGLIFLOZIN (DAP a gli FLOE zin) helps to treat type 2 diabetes. It helps to control blood sugar. Treatment is combined with diet and exercise. This medicine may be used for other purposes; ask your health care provider or pharmacist if you have questions. COMMON BRAND NAME(S): Wilder Glade What should I tell my health care provider before I take this medicine? They need to know if you have any of these conditions: -bladder cancer -dehydration -diabetic ketoacidosis -diet low in salt -eating less due to illness, surgery, dieting, or any other reason -having surgery -high cholesterol -history of pancreatitis or pancreas problems -history of yeast infection of the penis or vagina -if you often drink alcohol -infections in the bladder, kidneys, or urinary tract -kidney disease -low blood pressure -on hemodialysis -problems urinating -type 1 diabetes -uncircumcised female -an unusual or allergic reaction to dapagliflozin, other medicines, foods, dyes, or preservatives -pregnant or trying to get  pregnant -breast-feeding How should I use this medicine? Take this medicine by mouth with a glass of water. Follow the directions on the prescription label. You can take it with or without food. If it upsets your stomach, take it with food. Take this medicine in the morning. Take your dose at the same time each day. Do not take more often than directed. Do not stop taking except on your doctor's advice. A special MedGuide will be given to you by the pharmacist with each prescription and refill. Be sure to read this information carefully each time. Talk to your pediatrician regarding the use of this medicine in children. Special care may be needed. Overdosage: If you think you have taken too much of this medicine contact a poison control center or emergency room at once. NOTE: This medicine is only for you. Do not share this medicine with others. What if I miss a dose? If you miss a dose, take it as soon as you can. If it is almost time for your next dose, take only that dose. Do not take double or extra doses. What may interact with this medicine? Do not take this medicine with any of the following medications: -gatifloxacin This medicine may also interact with the following medications: -alcohol -certain medicines for blood pressure, heart disease -diuretics -insulin -nateglinide -pioglitazone -quinolone antibiotics like ciprofloxacin, levofloxacin, ofloxacin -repaglinide -some herbal dietary supplements -steroid medicines like prednisone or cortisone -sulfonylureas like glimepiride, glipizide, glyburide -thyroid medicine This list may not describe all possible interactions. Give your health care provider a list of all the medicines, herbs, non-prescription drugs, or dietary supplements you use. Also tell them  if you smoke, drink alcohol, or use illegal drugs. Some items may interact with your medicine. What should I watch for while using this medicine? Visit your doctor or health care  professional for regular checks on your progress. This medicine can cause a serious condition in which there is too much acid in the blood. If you develop nausea, vomiting, stomach pain, unusual tiredness, or breathing problems, stop taking this medicine and call your doctor right away. If possible, use a ketone dipstick to check for ketones in your urine. A test called the HbA1C (A1C) will be monitored. This is a simple blood test. It measures your blood sugar control over the last 2 to 3 months. You will receive this test every 3 to 6 months. Learn how to check your blood sugar. Learn the symptoms of low and high blood sugar and how to manage them. Always carry a quick-source of sugar with you in case you have symptoms of low blood sugar. Examples include hard sugar candy or glucose tablets. Make sure others know that you can choke if you eat or drink when you develop serious symptoms of low blood sugar, such as seizures or unconsciousness. They must get medical help at once. Tell your doctor or health care professional if you have high blood sugar. You might need to change the dose of your medicine. If you are sick or exercising more than usual, you might need to change the dose of your medicine. Do not skip meals. Ask your doctor or health care professional if you should avoid alcohol. Many nonprescription cough and cold products contain sugar or alcohol. These can affect blood sugar. Wear a medical ID bracelet or chain, and carry a card that describes your disease and details of your medicine and dosage times. What side effects may I notice from receiving this medicine? Side effects that you should report to your doctor or health care professional as soon as possible: -allergic reactions like skin rash, itching or hives, swelling of the face, lips, or tongue -breathing problems -dizziness -feeling faint or lightheaded, falls -muscle weakness -nausea, vomiting, unusual stomach upset or  pain -signs and symptoms of low blood sugar such as feeling anxious, confusion, dizziness, increased hunger, unusually weak or tired, sweating, shakiness, cold, irritable, headache, blurred vision, fast heartbeat, loss of consciousness -signs and symptoms of a urinary tract infection, such as fever, chills, a burning feeling when urinating, blood in the urine, back pain -trouble passing urine or change in the amount of urine, including an urgent need to urinate more often, in larger amounts, or at night -penile discharge, itching, or pain in men -unusual tiredness -vaginal discharge, itching, or odor in women Side effects that usually do not require medical attention (report to your doctor or health care professional if they continue or are bothersome): -constipation -mild increase in urination -stuffy or runny nose -sore throat -thirsty This list may not describe all possible side effects. Call your doctor for medical advice about side effects. You may report side effects to FDA at 1-800-FDA-1088. Where should I keep my medicine? Keep out of the reach of children. Store at room temperature between 15 and 30 degrees C (59 and 86 degrees F). Throw away any unused medicine after the expiration date. NOTE: This sheet is a summary. It may not cover all possible information. If you have questions about this medicine, talk to your doctor, pharmacist, or health care provider.  2018 Elsevier/Gold Standard (2015-11-07 08:46:40)

## 2017-12-14 NOTE — Progress Notes (Signed)
   Subjective   Patient ID: Amanda Shepherd    DOB: Feb 12, 1951, 67 y.o. female   MRN: [id number]  CC: "Diabetes"  HPI: Amanda Shepherd is a 67 y.o. female who presents to clinic today for the following:  Diabetes: Chronic and poorly controlled.  She currently on reduced metformin dose since initiating Basilglar during her last visit 1 month ago.  She was previously on glipizide but discontinued this medication because of persistently elevated A1c.  Patient states she cannot do the injections because it hurts her abdomen she is interested in trying oral options at this time.  She is aware of the diet modifications needed to control diabetes including reduced carbohydrate intake and portion control.  She walks regularly during the week.  She is established with an ophthalmologist.  Patient denies history of change in vision, chest pain, shortness of breath, nausea or vomiting, abdominal pain, loss of motor function, sensory loss.  ROS: see HPI for pertinent.  [place]: NIDT2DM with retinopathy, HLD, HTN.  Surgical history TAH.  Family history HTN.  Smoking status reviewed. Medications reviewed.  Objective   BP 118/68   Pulse 84   Temp 98.7 F (37.1 C) (Oral)   Ht 5\' 7"  (1.702 m)   Wt 150 lb (68 kg)   SpO2 98%   BMI 23.49 kg/m  Vitals and nursing note reviewed.  General: thin female, well nourished, well developed, NAD with non-toxic appearance HEENT: normocephalic, atraumatic, moist mucous membranes Neck: supple, non-tender without lymphadenopathy Cardiovascular: regular rate and rhythm without murmurs, rubs, or gallops Lungs: clear to auscultation bilaterally with normal work of breathing Abdomen: soft, non-tender, non-distended, normoactive bowel sounds Skin: warm, dry, no rashes or lesions, cap refill < 2 seconds Extremities: warm and well perfused, normal tone, no edema  Assessment & Plan   Uncontrolled diabetes mellitus with retinopathy (HCC) Chronic.  Poorly controlled.  A1c 11.2  last month.  Recently started on basilar however patient does not like injections he would like alternative options.  We will consider oral agents today and recheck in 2 weeks.  Patient will likely need insulin at some point but will try other medications for the time being. - Increasing metformin to 1000 mg twice daily and discontinuing Basiglar - Initiating Farxiga 10 mg daily with instructions to continue checking fasting and postprandial CBGs - Discussed lifestyle modifications at length - RTC 2 weeks  No orders of the defined types were placed in this encounter.  Meds ordered this encounter  Medications  . metFORMIN (GLUCOPHAGE) 1000 MG tablet    Sig: Take 1 tablet (1,000 mg total) by mouth 2 (two) times daily with a meal.    Dispense:  180 tablet    Refill:  3  . dapagliflozin propanediol (FARXIGA) 10 MG TABS tablet    Sig: Take 10 mg by mouth daily.    Dispense:  90 tablet    Refill:  0    Harriet Butte, [place], PGY-2 12/14/2017, 4:34 PM

## 2017-12-27 ENCOUNTER — Telehealth: Payer: Self-pay | Admitting: Family Medicine

## 2017-12-27 NOTE — Telephone Encounter (Signed)
Pt was seen 12-14-17. She was told to return in 2 weeks to check on her diabetes. There are no appts available until 01-17-18.  Pt wants to know what to do. Please advise

## 2017-12-28 NOTE — Telephone Encounter (Signed)
Please have patient schedule appointment with another provider sometime this week.  She can follow-up with me at my next available appointment in April.  Thank you.  Harriet Butte, [place], PGY-2

## 2017-12-29 ENCOUNTER — Encounter: Payer: Self-pay | Admitting: Internal Medicine

## 2017-12-29 ENCOUNTER — Ambulatory Visit (INDEPENDENT_AMBULATORY_CARE_PROVIDER_SITE_OTHER): Payer: BC Managed Care – PPO | Admitting: Internal Medicine

## 2017-12-29 DIAGNOSIS — E1165 Type 2 diabetes mellitus with hyperglycemia: Secondary | ICD-10-CM | POA: Diagnosis not present

## 2017-12-29 DIAGNOSIS — IMO0002 Reserved for concepts with insufficient information to code with codable children: Secondary | ICD-10-CM

## 2017-12-29 DIAGNOSIS — E11319 Type 2 diabetes mellitus with unspecified diabetic retinopathy without macular edema: Secondary | ICD-10-CM

## 2017-12-29 NOTE — Assessment & Plan Note (Addendum)
Remains poorly controlled, though recent home blood sugar log improved from prior. Very pleased with current medication regimen and resistant to changing regimen today. As blood sugars have improved, can continue current regimen for now, however informed patient that if blood sugars are still not at goal with next appt with PCP in three weeks, will likely need to alter regimen. Encouraged her to continue with her dietary goals and begin exercising as planned.  - Cont Wilder Glade 10mg  qd and metformin 1000mg  BID - F/u with PCP Dr. Yisroel Ramming in three weeks - Continue to record blood sugar at home and bring log to appt - Call if blood sugars persistently elevated - Next A1C due 04/23

## 2017-12-29 NOTE — Progress Notes (Signed)
   Subjective:   Patient: Amanda Shepherd       Birthdate: [birth year]       MRN: [id number]      HPI  JATOYA ARMBRISTER is a 67 y.o. female presenting for DM f/u.   Type II DM Pt last seen by PCP Dr. Yisroel Ramming on 02/26 for DM f/u. At that time, glycemic control poor. Patient also disliked insulin injections and requested alternative treatment. Metformin dose was increased to 1000mg  BID, basiglar was discontinued, and Farxiga 10mg  qd was started, with instruction to check fasting and postprandial CBGs and f/u.  Patient says she feels her blood sugars have significantly improved in the past two weeks. Highest blood sugar at home was 291, but she says she ate something she knows she should not have before that. Lowest 114, which was postprandial. Lowest fasting 134. She is very pleased with Wilder Glade and would like to continue on this. She realizes her blood sugars are not at goal, but thinks this is in part due to her only being on the medication for two weeks, and in part because of her diet. Is trying to make dietary changes, including eating less carbs, and drinking less soda. Also says she plans to begin exercising again soon since the weather is becoming warmer. She plans to begin walking for exercise.   Smoking status reviewed. Patient is never smoker.   Review of Systems See HPI.     Objective:  Physical Exam  Constitutional: She is oriented to person, place, and time and well-developed, well-nourished, and in no distress.  HENT:  Head: Normocephalic and atraumatic.  Pulmonary/Chest: Effort normal. No respiratory distress.  Neurological: She is alert and oriented to person, place, and time.  Skin: Skin is warm and dry.  Psychiatric: Affect and judgment normal.      Assessment & Plan:  Uncontrolled diabetes mellitus with retinopathy ([place]) Remains poorly controlled, though recent home blood sugar log improved from prior. Very pleased with current medication regimen and resistant to changing  regimen today. As blood sugars have improved, can continue current regimen for now, however informed patient that if blood sugars are still not at goal with next appt with PCP in three weeks, will likely need to alter regimen. Encouraged her to continue with her dietary goals and begin exercising as planned.  - F/u with PCP Dr. Yisroel Ramming in three weeks - Continue to record blood sugar at home and bring log to appt - Call if blood sugars persistently elevated - Next A1C due 04/23   Adin Hector, MD, MPH PGY-3 [facility] Pager [phone]

## 2017-12-29 NOTE — Patient Instructions (Signed)
It was nice meeting you today [name]!  Please continue your current medication regimen as you have been. Please also continue to measure your fasting and after dinner blood sugars as you have been, and bring these to your next appointment.   If you find that your blood sugars become very high again, please let us know so we can schedule a sooner appointment.   If you have any questions or concerns, please feel free to call the clinic.   Be well,  Dr. Avon Gully

## 2017-12-29 NOTE — Telephone Encounter (Signed)
Pt was seen by [place] today. Deseree Kennon Holter, CMA

## 2018-01-18 ENCOUNTER — Other Ambulatory Visit: Payer: Self-pay

## 2018-01-18 DIAGNOSIS — E113299 Type 2 diabetes mellitus with mild nonproliferative diabetic retinopathy without macular edema, unspecified eye: Secondary | ICD-10-CM

## 2018-01-18 DIAGNOSIS — IMO0002 Reserved for concepts with insufficient information to code with codable children: Secondary | ICD-10-CM

## 2018-01-18 DIAGNOSIS — E1165 Type 2 diabetes mellitus with hyperglycemia: Principal | ICD-10-CM

## 2018-01-18 MED ORDER — ONETOUCH DELICA LANCETS 33G MISC: 1.0000 | Freq: Two times a day (BID) | 11 refills | Status: DC

## 2018-01-24 NOTE — Progress Notes (Signed)
   Subjective   Patient ID: Amanda Shepherd    DOB: Mar 05, 1951, 67 y.o. female   MRN: [id number]  CC: "Follow-up for diabetes"  HPI: Amanda Shepherd is a 67 y.o. female who presents to clinic today for the following:  Diabetes: Patient with long-standing diabetes without insulin use.  She is currently tolerating the recent increase in metformin and initiation of Farxiga.  Patient checks CBGs regularly but is concerned that her glucometer is inaccurate, however she did not bring her glucometer today to be checked.  She does have records of her CBGs with fasting in the low to mid 100s though afternoon CBGs do not indicate whether they were postprandial or prior to meals and are therefore not reliable though they tend to be in the mid 100s.  Patient feels her glucose levels have been better controlled since starting the [place].  She denies any fevers or chills, polyuria, dysuria.  She is up-to-date on her diabetic eye exam as of 06/23/2017.  ROS: see HPI for pertinent.  [place]: NIDT2DM with retinopathy, HLD, HTN.  Surgical history TAH.  Family history HTN. Smoking status reviewed. Medications reviewed.  Objective   BP 100/70 (BP Location: Left Arm, Patient Position: Sitting, Cuff Size: Normal)   Pulse 88   Temp 97.9 F (36.6 C) (Oral)   Ht 5\' 7"  (1.702 m)   Wt 144 lb 9.6 oz (65.6 kg)   SpO2 99%   BMI 22.65 kg/m  Vitals and nursing note reviewed.  General: well nourished, well developed, NAD with non-toxic appearance HEENT: normocephalic, atraumatic, moist mucous membranes Cardiovascular: regular rate and rhythm without murmurs, rubs, or gallops Lungs: clear to auscultation bilaterally with normal work of breathing Skin: warm, dry, no rashes or lesions, cap refill < 2 seconds Extremities: warm and well perfused, normal tone, no edema  Assessment & Plan   Uncontrolled type 2 diabetes mellitus with mild nonproliferative retinopathy, without long-term current use of insulin (HCC) Chronic.   Uncontrolled based on A1c 11.2 as of 11/10/17.  Patient tolerating SGLT 2 inhibitor and increasing metformin.  Patient has clear understanding on how to treat hypoglycemia. - Continue Farxiga 10 mg daily and metformin 1000 mg twice daily - Advised patient to continue checking fasting and postprandial CBGs and bring glucometer at next appointment - Next A1c due 02/08/18 - Administered PPSV-23 vaccine and annual diabetic foot exam - RTC 1 month for complete physical exam  Orders Placed This Encounter  Procedures  . Pneumococcal conjugate vaccine 13-valent   No orders of the defined types were placed in this encounter.   Harriet Butte, [place], PGY-2 01/25/2018, 11:32 AM

## 2018-01-25 ENCOUNTER — Ambulatory Visit: Payer: BC Managed Care – PPO | Admitting: Family Medicine

## 2018-01-25 ENCOUNTER — Encounter: Payer: Self-pay | Admitting: Family Medicine

## 2018-01-25 VITALS — BP 100/70 | HR 88 | Temp 97.9°F | Ht 67.0 in | Wt 144.6 lb

## 2018-01-25 DIAGNOSIS — E113299 Type 2 diabetes mellitus with mild nonproliferative diabetic retinopathy without macular edema, unspecified eye: Secondary | ICD-10-CM

## 2018-01-25 DIAGNOSIS — Z23 Encounter for immunization: Secondary | ICD-10-CM | POA: Diagnosis not present

## 2018-01-25 DIAGNOSIS — IMO0002 Reserved for concepts with insufficient information to code with codable children: Secondary | ICD-10-CM

## 2018-01-25 DIAGNOSIS — E1165 Type 2 diabetes mellitus with hyperglycemia: Secondary | ICD-10-CM

## 2018-01-25 NOTE — Patient Instructions (Signed)
Thank you for coming in to see Amanda Shepherd today. Please see below to review our plan for today's visit.  1.  Continue the metformin and Farxiga as prescribed.  Continue checking your sugar levels first thing in the morning before breakfast and 2 hours after lunch and dinner.  Record these and bring your glucometer at the next visit so we can check if it is accurate. 2.  You are now to date on your pneumonia vaccine and have received your annual diabetic foot exam. 3.  I would like to see you again in 1 month for a full physical exam.  Please call the clinic at [phone] if your symptoms worsen or you have any concerns. It was our pleasure to serve you.  Harriet Butte, [place], PGY-2

## 2018-01-25 NOTE — Assessment & Plan Note (Addendum)
Chronic.  Uncontrolled based on A1c 11.2 as of 11/10/17.  Patient tolerating SGLT 2 inhibitor and increasing metformin.  Patient has clear understanding on how to treat hypoglycemia. - Continue Farxiga 10 mg daily and metformin 1000 mg twice daily - Advised patient to continue checking fasting and postprandial CBGs and bring glucometer at next appointment - Next A1c due 02/08/18 - Administered PPSV-23 vaccine and annual diabetic foot exam - RTC 1 month for complete physical exam

## 2018-02-21 ENCOUNTER — Encounter: Payer: Self-pay | Admitting: Family Medicine

## 2018-03-07 ENCOUNTER — Other Ambulatory Visit: Payer: Self-pay | Admitting: Family Medicine

## 2018-03-07 DIAGNOSIS — IMO0002 Reserved for concepts with insufficient information to code with codable children: Secondary | ICD-10-CM

## 2018-03-07 DIAGNOSIS — E1165 Type 2 diabetes mellitus with hyperglycemia: Principal | ICD-10-CM

## 2018-03-07 DIAGNOSIS — E11319 Type 2 diabetes mellitus with unspecified diabetic retinopathy without macular edema: Secondary | ICD-10-CM

## 2018-03-23 NOTE — Progress Notes (Signed)
Subjective   Patient ID: Amanda Shepherd    DOB: May 24, 1951, 67 y.o. female   MRN: [id number]  CC: "Diabetes"  HPI: Amanda Shepherd is a 67 y.o. female who presents to clinic today for the following:  Diabetes: Patient has been tolerating [place] since initiating treatment earlier this year.  She continues to use metformin.  Patient not checking CBGs due to pain with pen needle.  She was not taking [place] for approximately 2 weeks because she ran out of medication but has restarted as prescribed.  Patient has been walking more often but does not do any other form of exercise.  She has been trying to avoid excessive carbohydrates by reducing sugar intake.  Denies any change in vision, polyuria or polydipsia, nausea or vomiting, abdominal pain.  Nonproductive cough: Began approximately 3 weeks ago with progressive worsening.  Says she occasionally coughs up something very and has a hoarse voice.  Not triggered at certain time of day.  Patient has not tried any medications.  States she thinks it may be bronchitis and was seen by pulmonologist many years ago and given a "inhaler" which helped her symptoms.  She denies any history of reflux.  Patient denies fevers or chills, chest pain, shortness of breath, hemoptysis.  She is a never smoker.  Hypertension: Patient tolerating lisinopril.  Was previously on aspirin daily.  And self discontinued aspirin daily.  ROS: see HPI for pertinent.  [place]: NIDT2DM with retinopathy, HLD, HTN.Surgical history TAH. Family history HTN. Smoking status reviewed. Medications reviewed.  Objective   BP 130/68   Pulse 90   Temp 97.6 F (36.4 C) (Oral)   Wt 136 lb 12.8 oz (62.1 kg)   SpO2 99%   BMI 21.43 kg/m  Vitals and nursing note reviewed.  General: well nourished, well developed, NAD with non-toxic appearance HEENT: normocephalic, atraumatic, moist mucous membranes, clear nares without edema or discharge Neck: supple, non-tender without  lymphadenopathy Cardiovascular: regular rate and rhythm without murmurs, rubs, or gallops Lungs: clear to auscultation bilaterally with normal work of breathing Abdomen: soft, non-tender, non-distended, normoactive bowel sounds Skin: warm, dry, no rashes or lesions, cap refill < 2 seconds Extremities: warm and well perfused, normal tone, no edema  Assessment & Plan   Uncontrolled type 2 diabetes mellitus with mild nonproliferative retinopathy, without long-term current use of insulin (HCC) Chronic.  Improved though suboptimal control, A1c 8.1.  Likely due to initiating [place] which patient is tolerating well.  Patient not adherent to checking CBGs due to pain with pen-needle.  Up-to-date on foot and eye exams. - Continue Farxiga 10 mg daily and metformin 1000 mg twice daily - Next A1c due 06/25/2018 at which point we will also obtain routine annual labs - Encourage patient to continue exercising and avoid excessive carbohydrate intake  Primary hypertension Chronic.  Controlled.  On lisinopril.  Recently discontinued aspirin daily.  Non-smoker.  Not on statin, has allergy to atorvastatin. - Continue lisinopril 10 mg daily - Discussed use of aspirin, in agreement to discontinue aspirin daily at this time - Will need to consider statin therapy in the near future based on next lipid panel  Nonproductive cough Subacute.  Patient does have history of allergies versus asthma with inhaler use though not currently on controller medications.  Symptoms may be allergy so reflux seems to be more likely.  No red flags.  No signs of postnasal drip.  Lungs clear to auscultation and patient is without chest pain. - Advised patient to initiate  Zantac 35 mg twice daily for 2-week trial - RTC in 2 weeks, would consider PPI versus anti-histamine during next visit if uncontrolled  Orders Placed This Encounter  Procedures  . HgB A1c   No orders of the defined types were placed in this encounter.   Harriet Butte, [place], PGY-2 03/25/2018, 3:12 PM

## 2018-03-25 ENCOUNTER — Other Ambulatory Visit: Payer: Self-pay

## 2018-03-25 ENCOUNTER — Encounter: Payer: Self-pay | Admitting: Family Medicine

## 2018-03-25 ENCOUNTER — Ambulatory Visit (INDEPENDENT_AMBULATORY_CARE_PROVIDER_SITE_OTHER): Payer: BC Managed Care – PPO | Admitting: Family Medicine

## 2018-03-25 VITALS — BP 130/68 | HR 90 | Temp 97.6°F | Wt 136.8 lb

## 2018-03-25 DIAGNOSIS — I10 Essential (primary) hypertension: Secondary | ICD-10-CM

## 2018-03-25 DIAGNOSIS — E1165 Type 2 diabetes mellitus with hyperglycemia: Secondary | ICD-10-CM | POA: Diagnosis not present

## 2018-03-25 DIAGNOSIS — R05 Cough: Secondary | ICD-10-CM | POA: Diagnosis not present

## 2018-03-25 DIAGNOSIS — E113299 Type 2 diabetes mellitus with mild nonproliferative diabetic retinopathy without macular edema, unspecified eye: Secondary | ICD-10-CM

## 2018-03-25 DIAGNOSIS — R058 Other specified cough: Secondary | ICD-10-CM

## 2018-03-25 DIAGNOSIS — IMO0002 Reserved for concepts with insufficient information to code with codable children: Secondary | ICD-10-CM

## 2018-03-25 LAB — POCT GLYCOSYLATED HEMOGLOBIN (HGB A1C): HBA1C, POC (CONTROLLED DIABETIC RANGE): 8.1 % — AB (ref 0.0–7.0)

## 2018-03-25 NOTE — Patient Instructions (Addendum)
Thank you for coming in to see [place] today. Please see below to review our plan for today's visit.  1.  Continue the Farxiga 10 mg daily along with metformin 1000 mg twice daily.  We will recheck your A1c in 3 months.  I encourage you to continue exercising with some moderate to high intensity for approximately 150 minutes/week. 2.  Your cough may be related to reflux.  I would like you to take Zantac 75 mg twice daily over-the-counter.  Do this for 2 weeks.  I would like to see you again around this time to reassess her symptom control.  Please call the clinic at [phone] if your symptoms worsen or you have any concerns. It was our pleasure to serve you.  Harriet Butte, [place], PGY-2

## 2018-03-25 NOTE — Assessment & Plan Note (Signed)
Subacute.  Patient does have history of allergies versus asthma with inhaler use though not currently on controller medications.  Symptoms may be allergy so reflux seems to be more likely.  No red flags.  No signs of postnasal drip.  Lungs clear to auscultation and patient is without chest pain. - Advised patient to initiate Zantac 35 mg twice daily for 2-week trial - RTC in 2 weeks, would consider PPI versus anti-histamine during next visit if uncontrolled

## 2018-03-25 NOTE — Assessment & Plan Note (Signed)
Chronic.  Improved though suboptimal control, A1c 8.1.  Likely due to initiating [place] which patient is tolerating well.  Patient not adherent to checking CBGs due to pain with pen-needle.  Up-to-date on foot and eye exams. - Continue Farxiga 10 mg daily and metformin 1000 mg twice daily - Next A1c due 06/25/2018 at which point we will also obtain routine annual labs - Encourage patient to continue exercising and avoid excessive carbohydrate intake

## 2018-03-25 NOTE — Assessment & Plan Note (Signed)
Chronic.  Controlled.  On lisinopril.  Recently discontinued aspirin daily.  Non-smoker.  Not on statin, has allergy to atorvastatin. - Continue lisinopril 10 mg daily - Discussed use of aspirin, in agreement to discontinue aspirin daily at this time - Will need to consider statin therapy in the near future based on next lipid panel

## 2018-06-13 ENCOUNTER — Other Ambulatory Visit: Payer: Self-pay | Admitting: Family Medicine

## 2018-06-13 DIAGNOSIS — E11319 Type 2 diabetes mellitus with unspecified diabetic retinopathy without macular edema: Secondary | ICD-10-CM

## 2018-06-13 DIAGNOSIS — IMO0002 Reserved for concepts with insufficient information to code with codable children: Secondary | ICD-10-CM

## 2018-06-13 DIAGNOSIS — E1165 Type 2 diabetes mellitus with hyperglycemia: Principal | ICD-10-CM

## 2018-08-19 LAB — HM DIABETES EYE EXAM

## 2018-10-27 ENCOUNTER — Encounter: Payer: Self-pay | Admitting: Family Medicine

## 2018-12-28 ENCOUNTER — Other Ambulatory Visit: Payer: Self-pay | Admitting: Family Medicine

## 2018-12-28 DIAGNOSIS — E11319 Type 2 diabetes mellitus with unspecified diabetic retinopathy without macular edema: Secondary | ICD-10-CM

## 2018-12-28 DIAGNOSIS — E1165 Type 2 diabetes mellitus with hyperglycemia: Principal | ICD-10-CM

## 2018-12-28 DIAGNOSIS — IMO0002 Reserved for concepts with insufficient information to code with codable children: Secondary | ICD-10-CM

## 2018-12-30 ENCOUNTER — Other Ambulatory Visit: Payer: Self-pay | Admitting: Family Medicine

## 2018-12-30 DIAGNOSIS — E1165 Type 2 diabetes mellitus with hyperglycemia: Principal | ICD-10-CM

## 2018-12-30 DIAGNOSIS — IMO0002 Reserved for concepts with insufficient information to code with codable children: Secondary | ICD-10-CM

## 2018-12-30 DIAGNOSIS — E11319 Type 2 diabetes mellitus with unspecified diabetic retinopathy without macular edema: Secondary | ICD-10-CM

## 2019-01-04 ENCOUNTER — Other Ambulatory Visit: Payer: Self-pay | Admitting: Family Medicine

## 2019-01-04 ENCOUNTER — Telehealth: Payer: Self-pay | Admitting: Family Medicine

## 2019-01-04 DIAGNOSIS — E11319 Type 2 diabetes mellitus with unspecified diabetic retinopathy without macular edema: Secondary | ICD-10-CM

## 2019-01-04 DIAGNOSIS — E1165 Type 2 diabetes mellitus with hyperglycemia: Principal | ICD-10-CM

## 2019-01-04 DIAGNOSIS — IMO0002 Reserved for concepts with insufficient information to code with codable children: Secondary | ICD-10-CM

## 2019-01-04 MED ORDER — METFORMIN HCL 1000 MG PO TABS
ORAL_TABLET | ORAL | 3 refills | Status: DC
Start: 2019-01-04 — End: 2020-02-16

## 2019-01-04 MED ORDER — DAPAGLIFLOZIN PROPANEDIOL 10 MG PO TABS
10.0000 mg | ORAL_TABLET | Freq: Every day | ORAL | 3 refills | Status: DC
Start: 2019-01-04 — End: 2020-01-10

## 2019-01-04 NOTE — Telephone Encounter (Signed)
Rx sent. See phone note.  Harriet Butte, [place], PGY-3

## 2019-01-04 NOTE — Telephone Encounter (Signed)
Pt is calling to schedule dm appointment as well as needing her medications refilled. Was not able to get a hold of provider on skype or phone.   Please call pt back at [phone]

## 2019-01-04 NOTE — Telephone Encounter (Signed)
Received call from triage.  Patient requesting diabetes medication refill including Farxiga and metformin.  Has been tolerating medications well.  Has not been seen since June however given recent COVID-19 outbreak, patient requesting to be seen around May to June if possible.  Advised patient to call clinic back next month to schedule appointment.  Also advised patient to contact us in the interim for questions or concerns.  Harriet Butte, [place], PGY-3

## 2019-03-28 ENCOUNTER — Encounter (HOSPITAL_COMMUNITY): Payer: Self-pay

## 2019-03-28 ENCOUNTER — Ambulatory Visit (HOSPITAL_COMMUNITY)
Admission: EM | Admit: 2019-03-28 | Discharge: 2019-03-28 | Disposition: A | Discharge status: Home / Self Care | Payer: BC Managed Care – PPO | Attending: Family Medicine | Admitting: Family Medicine

## 2019-03-28 ENCOUNTER — Ambulatory Visit (INDEPENDENT_AMBULATORY_CARE_PROVIDER_SITE_OTHER): Payer: BC Managed Care – PPO

## 2019-03-28 ENCOUNTER — Other Ambulatory Visit: Payer: Self-pay

## 2019-03-28 DIAGNOSIS — R059 Cough, unspecified: Secondary | ICD-10-CM

## 2019-03-28 DIAGNOSIS — R05 Cough: Secondary | ICD-10-CM

## 2019-03-28 DIAGNOSIS — E1165 Type 2 diabetes mellitus with hyperglycemia: Secondary | ICD-10-CM

## 2019-03-28 LAB — GLUCOSE, CAPILLARY: Glucose-Capillary: 338 mg/dL — ABNORMAL HIGH (ref 70–99)

## 2019-03-28 MED ORDER — CETIRIZINE HCL 10 MG PO TABS: 10.0000 mg | ORAL_TABLET | Freq: Every day | ORAL | 0 refills | Status: DC

## 2019-03-28 MED ORDER — ALBUTEROL SULFATE HFA 108 (90 BASE) MCG/ACT IN AERS: 1.0000 | INHALATION_SPRAY | Freq: Four times a day (QID) | RESPIRATORY_TRACT | 0 refills | Status: DC | PRN

## 2019-03-28 MED ORDER — BENZONATATE 100 MG PO CAPS: 100.0000 mg | ORAL_CAPSULE | Freq: Three times a day (TID) | ORAL | 0 refills | Status: DC

## 2019-03-28 MED ORDER — FLUTICASONE PROPIONATE 50 MCG/ACT NA SUSP: 1.0000 | Freq: Every day | NASAL | 2 refills | Status: DC

## 2019-03-28 NOTE — ED Provider Notes (Signed)
[facility]    CSN: [id number] Arrival date & time: 03/28/19  1037     History   Chief Complaint Chief Complaint  Patient presents with  . Cough    HPI Amanda Shepherd is a 68 y.o. female.   Patient is a 68 year old female with past medical history of bronchitis, diabetes, hyperlipidemia that presents with approximately 2 weeks of waxing and waning cough.  Reporting some production with cough.  A lot of postnasal drip.  All of the symptoms started after cutting the grass.  History of severe allergies.  She has not been taking her allergy medicine.  She has been taking over-the-counter cold and flu medication, Tessalon Perles and methylprednisone that she had leftover from previous bronchitis.  Denies smoking.  Patient is mildly tachycardic and low-grade fever here today.  Denies any fevers at home.  Reporting she feels more dehydrated.  She has been drinking fluids, orange juice, Gatorade and water.   ROS per HPI      Past Medical History:  Diagnosis Date  . Bronchitis   . Diabetes mellitus   . Hyperlipidemia     Patient Active Problem List   Diagnosis Date Noted  . Nonproductive cough 03/25/2018  . Mild non proliferative diabetic retinopathy ([place]) 06/04/2014  . Primary hypertension 09/22/2010  . Uncontrolled type 2 diabetes mellitus with mild nonproliferative retinopathy, without long-term current use of insulin ([place]) 02/14/2007  . Hyperlipidemia associated with type 2 diabetes mellitus ([place]) 02/14/2007    Past Surgical History:  Procedure Laterality Date  . ABDOMINAL HYSTERECTOMY      OB History   No obstetric history on file.      Home Medications    Prior to Admission medications   Medication Sig Start Date End Date Taking? Authorizing Provider  albuterol (VENTOLIN HFA) 108 (90 Base) MCG/ACT inhaler Inhale 1-2 puffs into the lungs every 6 (six) hours as needed for wheezing or shortness of breath. 03/28/19   Loura Halt A, NP  aspirin EC 81 MG tablet  Take 81 mg by mouth daily.    [provider]  benzonatate (TESSALON) 100 MG capsule Take 1 capsule (100 mg total) by mouth every 8 (eight) hours. 03/28/19   Loura Halt A, NP  Blood Glucose Monitoring Suppl (ONE TOUCH ULTRA 2) w/Device KIT 1 Device by Does not apply route 2 (two) times daily. 05/31/17   [place] Bing, DO  cetirizine (ZYRTEC) 10 MG tablet Take 1 tablet (10 mg total) by mouth daily. 03/28/19   Loura Halt A, NP  dapagliflozin propanediol (FARXIGA) 10 MG TABS tablet Take 10 mg by mouth daily. 01/04/19   [place] Bing, DO  fluticasone (FLONASE) 50 MCG/ACT nasal spray Place 1 spray into both nostrils daily. 03/28/19   Loura Halt A, NP  glucose blood (ONE TOUCH ULTRA TEST) test strip Check sugar 2 x daily 05/31/17   [place] Bing, DO  Insulin Pen Needle (BD PEN NEEDLE NANO U/F) 32G X 4 MM MISC Use 1 pen needle with each insulin dose once a day 06/10/17   Zenia Resides, MD  lisinopril (PRINIVIL,ZESTRIL) 10 MG tablet Take 1 tablet (10 mg total) by mouth daily. 09/21/17   [place] Bing, DO  metFORMIN (GLUCOPHAGE) 1000 MG tablet TAKE 1 TABLET BY MOUTH TWICE DAILY WITH A MEAL 01/04/19   [place] Bing, DO  ONETOUCH DELICA LANCETS 86V MISC 1 Device by Does not apply route 2 (two) times daily. 01/18/18   [place] Bing, DO  Family History Family History  Problem Relation Age of Onset  . Hypertension Mother     Social History Social History   Tobacco Use  . Smoking status: Never Smoker  . Smokeless tobacco: Never Used  Substance Use Topics  . Alcohol use: No  . Drug use: No     Allergies   Atorvastatin   Review of Systems Review of Systems   Physical Exam Triage Vital Signs ED Triage Vitals  Enc Vitals Group     BP 03/28/19 1057 135/80     Pulse Rate 03/28/19 1057 (!) 101     Resp 03/28/19 1057 17     Temp 03/28/19 1057 99.3 F (37.4 C)     Temp Source 03/28/19 1057 Oral     SpO2 03/28/19 1057 99 %     Weight --      Height --      Head  Circumference --      Peak Flow --      Pain Score 03/28/19 1058 6     Pain Loc --      Pain Edu? --      Excl. in [place]? --    No data found.  Updated Vital Signs BP 135/80 (BP Location: Left Arm)   Pulse (!) 101   Temp 99.3 F (37.4 C) (Oral)   Resp 17   SpO2 99%   Visual Acuity Right Eye Distance:   Left Eye Distance:   Bilateral Distance:    Right Eye Near:   Left Eye Near:    Bilateral Near:     Physical Exam Vitals signs and nursing note reviewed.  Constitutional:      Appearance: Normal appearance.  HENT:     Head: Normocephalic and atraumatic.     Right Ear: Tympanic membrane and ear canal normal.     Left Ear: Tympanic membrane and ear canal normal.     Nose: Nose normal.     Mouth/Throat:     Comments: Moderate drainage in throat  Eyes:     Conjunctiva/sclera: Conjunctivae normal.  Neck:     Musculoskeletal: Normal range of motion.  Cardiovascular:     Rate and Rhythm: Normal rate and regular rhythm.  Pulmonary:     Effort: Pulmonary effort is normal.     Breath sounds: Normal breath sounds.     Comments: Coughing during exam  Musculoskeletal: Normal range of motion.  Skin:    General: Skin is warm and dry.  Neurological:     Mental Status: She is alert.  Psychiatric:        Mood and Affect: Mood normal.      UC Treatments / Results  Labs (all labs ordered are listed, but only abnormal results are displayed) Labs Reviewed  GLUCOSE, CAPILLARY - Abnormal; Notable for the following components:      Result Value   Glucose-Capillary 338 (*)    All other components within normal limits  CBG MONITORING, ED    EKG None  Radiology Dg Chest 2 View  Result Date: 03/28/2019 CLINICAL DATA:  Per pt: Bad cough x 2 weeks. No injury. No Sob or wheezing. History of diabetes, chronic bronchitis, and allergies. Nonsmokercough x 2 weeks EXAM: CHEST - 2 VIEW COMPARISON:  None. FINDINGS: Normal mediastinum and cardiac silhouette. Normal pulmonary  vasculature. No evidence of effusion, infiltrate, or pneumothorax. No acute bony abnormality. IMPRESSION: No acute cardiopulmonary process. Electronically Signed   By: Suzy Bouchard M.D.   On: 03/28/2019 12:11  Procedures Procedures (including critical care time)  Medications Ordered in UC Medications - No data to display  Initial Impression / Assessment and Plan / UC Course  I have reviewed the triage vital signs and the nursing notes.  Pertinent labs & imaging results that were available during my care of the patient were reviewed by me and considered in my medical decision making (see chart for details).     Patient is a 68 year old female with approximately 2 weeks of cough, thick drainage in throat.  This started after mowing the grass. She does have a history of severe allergies and has not been taking any allergy medicine. Symptoms consistent with allergies Her x-ray was negative for any pneumonia or bronchitis or other abnormalities. Not concerned for COVID at this time. Patient's blood sugar was up.  Reporting she has not taken her medication today. We will have her start Zyrtec and Flonase daily Tessalon Perles for cough Albuterol inhaler to use as needed for cough, wheezing or shortness of breath Follow up as needed for continued or worsening symptoms  Final Clinical Impressions(s) / UC Diagnoses   Final diagnoses:  Cough     Discharge Instructions     I believe that your symptoms are allergy related. Take the Zyrtec and Flonase as prescribed.  Tessalon Perles for cough Albuterol inhaler to use as needed for cough, wheezing or shortness of breath Make sure you are drinking plenty of fluids to include water.  Try to avoid sugary drinks and foods. Make sure you take your diabetes medicine when you get home Follow up as needed for continued or worsening symptoms    ED Prescriptions    Medication Sig Dispense Auth. Provider   benzonatate (TESSALON) 100 MG  capsule Take 1 capsule (100 mg total) by mouth every 8 (eight) hours. 21 capsule [place] A, NP   cetirizine (ZYRTEC) 10 MG tablet Take 1 tablet (10 mg total) by mouth daily. 30 tablet Koleman Marling A, NP   fluticasone (FLONASE) 50 MCG/ACT nasal spray Place 1 spray into both nostrils daily. 16 g Harman Ferrin A, NP   albuterol (VENTOLIN HFA) 108 (90 Base) MCG/ACT inhaler Inhale 1-2 puffs into the lungs every 6 (six) hours as needed for wheezing or shortness of breath. 1 Inhaler Loura Halt A, NP     Controlled Substance Prescriptions [place] Controlled Substance Registry consulted? Not Applicable   Orvan July, NP 03/28/19 1332

## 2019-03-28 NOTE — Discharge Instructions (Addendum)
I believe that your symptoms are allergy related. Take the Zyrtec and Flonase as prescribed.  Tessalon Perles for cough Albuterol inhaler to use as needed for cough, wheezing or shortness of breath Make sure you are drinking plenty of fluids to include water.  Try to avoid sugary drinks and foods. Make sure you take your diabetes medicine when you get home Follow up as needed for continued or worsening symptoms

## 2019-03-28 NOTE — ED Triage Notes (Signed)
Pt presents with ongoing productive cough X 14 days with no relief with OTC medication.

## 2019-03-30 ENCOUNTER — Encounter: Payer: Self-pay | Admitting: Family Medicine

## 2019-03-30 ENCOUNTER — Telehealth (INDEPENDENT_AMBULATORY_CARE_PROVIDER_SITE_OTHER): Payer: Medicare Other | Admitting: Family Medicine

## 2019-03-30 ENCOUNTER — Other Ambulatory Visit: Payer: Self-pay

## 2019-03-30 DIAGNOSIS — R05 Cough: Secondary | ICD-10-CM | POA: Diagnosis not present

## 2019-03-30 DIAGNOSIS — R059 Cough, unspecified: Secondary | ICD-10-CM

## 2019-03-30 NOTE — Assessment & Plan Note (Signed)
Patient has been experiencing dry cough for about 1 month. Differential includes postnasal drip due to allergies/cold, post-infectious bronchitis, GERD, and side effect from Lisinopril. -Continue Flonase daily in each nostril (patient given specific instructions to use it without inhaling deeply during application), Zyrtec -Tessalon perles for cough -Patient can use her inhaler with shortness of breath, but shouldn't use it too much as this can cause more irritation and coughing -Encouraged patient to consume hot/warm tea with 1 tbsp of Honey three times daily for cough and hydration -Emphasized importance of hydration -Educated her coughs like this can last as long as 6 weeks and that supportive care is the best treatment -Told her to contact us should things worsen, fever develop, or it becomes hard to breath

## 2019-03-30 NOTE — Progress Notes (Signed)
[facility] Telemedicine Visit  Patient consented to have virtual visit. Method of visit: Telephone  Encounter participants: Patient: Amanda Shepherd - located at home Provider: Daisy Shepherd - located at clinic Others (if applicable): none  Chief Complaint: 1 month of cough  HPI: Amanda Shepherd is a sweet patient who was sick with what sounds like allergies or a head cold 1 month ago and has had a cough ever since. At one point the cough was productive with green mucus. The mucus went away after about two days, but the cough has remained and even intensified. She states that going outside, specifically cutting the grass, makes her cough more.  She went to Urgent Care 2 days prior where she was prescribed Zyrtec, Flonase, Albuterol inhaler and Tessalon perles. She does not feel that the meds have helped much. She does not have a history of acid reflux. She denies any traveling, sick contacts, denies sneezing, runny nose, itchy eyes, nasal congestion. Did have "throat congestion" on physical exam at Urgent Care 2 days ago. No body aches.   Weakness: patient feels very tired in the morning, also started about 2 weeks ago, denies any focal weaknesses, feels a little unsteady in her gait, but denies any room spinning or accidents such as falling or hitting her head.   ROS: per HPI  Pertinent PMHx: Bronchitis, Uncontrolled Diabetes, Cough  Exam:  Respiratory: dry cough heard on exam   Assessment/Plan: Cough in adult Patient has been experiencing dry cough for about 1 month. Differential includes postnasal drip due to allergies/cold, post-infectious bronchitis, GERD, and side effect from Lisinopril. -Continue Flonase daily in each nostril (patient given specific instructions to use it without inhaling deeply during application), Zyrtec -Tessalon perles for cough -Patient can use her inhaler with shortness of breath, but shouldn't use it too much as this can cause more  irritation and coughing -Encouraged patient to consume hot/warm tea with 1 tbsp of Honey three times daily for cough and hydration -Emphasized importance of hydration -Educated her coughs like this can last as long as 6 weeks and that supportive care is the best treatment -Told her to contact us should things worsen, fever develop, or it becomes hard to breath  Time spent during visit with patient: 17:12 minutes   Amanda Shepherd, [place], PGY-1 03/30/2019 5:23 PM

## 2019-04-01 ENCOUNTER — Encounter (HOSPITAL_COMMUNITY): Payer: Self-pay | Admitting: Emergency Medicine

## 2019-04-01 ENCOUNTER — Emergency Department (HOSPITAL_COMMUNITY): Payer: Medicare Other

## 2019-04-01 ENCOUNTER — Other Ambulatory Visit: Payer: Self-pay

## 2019-04-01 ENCOUNTER — Inpatient Hospital Stay (HOSPITAL_COMMUNITY)
Admission: EM | Admit: 2019-04-01 | Discharge: 2019-04-04 | DRG: 177 | Disposition: A | Discharge status: Home / Self Care | Payer: Medicare Other | Attending: Internal Medicine | Admitting: Internal Medicine

## 2019-04-01 DIAGNOSIS — Z7984 Long term (current) use of oral hypoglycemic drugs: Secondary | ICD-10-CM | POA: Diagnosis not present

## 2019-04-01 DIAGNOSIS — J40 Bronchitis, not specified as acute or chronic: Secondary | ICD-10-CM

## 2019-04-01 DIAGNOSIS — E119 Type 2 diabetes mellitus without complications: Secondary | ICD-10-CM | POA: Diagnosis not present

## 2019-04-01 DIAGNOSIS — J1289 Other viral pneumonia: Secondary | ICD-10-CM | POA: Diagnosis present

## 2019-04-01 DIAGNOSIS — E785 Hyperlipidemia, unspecified: Secondary | ICD-10-CM | POA: Diagnosis present

## 2019-04-01 DIAGNOSIS — Z7951 Long term (current) use of inhaled steroids: Secondary | ICD-10-CM

## 2019-04-01 DIAGNOSIS — E873 Alkalosis: Secondary | ICD-10-CM | POA: Diagnosis present

## 2019-04-01 DIAGNOSIS — E860 Dehydration: Secondary | ICD-10-CM | POA: Diagnosis present

## 2019-04-01 DIAGNOSIS — IMO0002 Reserved for concepts with insufficient information to code with codable children: Secondary | ICD-10-CM

## 2019-04-01 DIAGNOSIS — A419 Sepsis, unspecified organism: Secondary | ICD-10-CM

## 2019-04-01 DIAGNOSIS — Z79899 Other long term (current) drug therapy: Secondary | ICD-10-CM

## 2019-04-01 DIAGNOSIS — N179 Acute kidney failure, unspecified: Secondary | ICD-10-CM | POA: Diagnosis present

## 2019-04-01 DIAGNOSIS — E1165 Type 2 diabetes mellitus with hyperglycemia: Secondary | ICD-10-CM | POA: Diagnosis present

## 2019-04-01 DIAGNOSIS — Z7982 Long term (current) use of aspirin: Secondary | ICD-10-CM | POA: Diagnosis not present

## 2019-04-01 DIAGNOSIS — U071 COVID-19: Principal | ICD-10-CM | POA: Diagnosis present

## 2019-04-01 DIAGNOSIS — Z794 Long term (current) use of insulin: Secondary | ICD-10-CM | POA: Diagnosis not present

## 2019-04-01 DIAGNOSIS — I10 Essential (primary) hypertension: Secondary | ICD-10-CM | POA: Diagnosis present

## 2019-04-01 DIAGNOSIS — R6520 Severe sepsis without septic shock: Secondary | ICD-10-CM | POA: Diagnosis not present

## 2019-04-01 DIAGNOSIS — E113299 Type 2 diabetes mellitus with mild nonproliferative diabetic retinopathy without macular edema, unspecified eye: Secondary | ICD-10-CM

## 2019-04-01 DIAGNOSIS — Z888 Allergy status to other drugs, medicaments and biological substances status: Secondary | ICD-10-CM

## 2019-04-01 LAB — COMPREHENSIVE METABOLIC PANEL
ALT: 25 U/L (ref 0–44)
AST: 45 U/L — ABNORMAL HIGH (ref 15–41)
Albumin: 3.4 g/dL — ABNORMAL LOW (ref 3.5–5.0)
Alkaline Phosphatase: 77 U/L (ref 38–126)
Anion gap: 14 (ref 5–15)
BUN: 41 mg/dL — ABNORMAL HIGH (ref 8–23)
CO2: 20 mmol/L — ABNORMAL LOW (ref 22–32)
Calcium: 8.6 mg/dL — ABNORMAL LOW (ref 8.9–10.3)
Chloride: 101 mmol/L (ref 98–111)
Creatinine, Ser: 1.81 mg/dL — ABNORMAL HIGH (ref 0.44–1.00)
GFR calc Af Amer: 33 mL/min — ABNORMAL LOW (ref >60)
GFR calc non Af Amer: 29 mL/min — ABNORMAL LOW (ref >60)
Glucose, Bld: 404 mg/dL — ABNORMAL HIGH (ref 70–99)
Potassium: 4.1 mmol/L (ref 3.5–5.1)
Sodium: 135 mmol/L (ref 135–145)
Total Bilirubin: 0.3 mg/dL (ref 0.3–1.2)
Total Protein: 7.8 g/dL (ref 6.5–8.1)

## 2019-04-01 LAB — CBC WITH DIFFERENTIAL/PLATELET
Abs Immature Granulocytes: 0.02 10*3/uL (ref 0.00–0.07)
Basophils Absolute: 0 10*3/uL (ref 0.0–0.1)
Basophils Relative: 0 %
Eosinophils Absolute: 0.2 10*3/uL (ref 0.0–0.5)
Eosinophils Relative: 2 %
HCT: 41.1 % (ref 36.0–46.0)
Hemoglobin: 13.5 g/dL (ref 12.0–15.0)
Immature Granulocytes: 0 %
Lymphocytes Relative: 12 %
Lymphs Abs: 1 10*3/uL (ref 0.7–4.0)
MCH: 30.1 pg (ref 26.0–34.0)
MCHC: 32.8 g/dL (ref 30.0–36.0)
MCV: 91.5 fL (ref 80.0–100.0)
Monocytes Absolute: 0.6 10*3/uL (ref 0.1–1.0)
Monocytes Relative: 8 %
Neutro Abs: 6.3 10*3/uL (ref 1.7–7.7)
Neutrophils Relative %: 78 %
Platelets: 264 10*3/uL (ref 150–400)
RBC: 4.49 MIL/uL (ref 3.87–5.11)
RDW: 11.6 % (ref 11.5–15.5)
WBC: 8.1 10*3/uL (ref 4.0–10.5)
nRBC: 0 % (ref 0.0–0.2)

## 2019-04-01 LAB — GLUCOSE, CAPILLARY: Glucose-Capillary: 200 mg/dL — ABNORMAL HIGH (ref 70–99)

## 2019-04-01 LAB — LACTIC ACID, PLASMA
Lactic Acid, Venous: 3.5 mmol/L (ref 0.5–1.9)
Lactic Acid, Venous: 2.3 mmol/L (ref 0.5–1.9)

## 2019-04-01 LAB — TROPONIN I: Troponin I: 0.03 ng/mL (ref <0.03)

## 2019-04-01 LAB — BLOOD GAS, VENOUS

## 2019-04-01 LAB — SARS CORONAVIRUS 2 BY RT PCR (HOSPITAL ORDER, PERFORMED IN ~~LOC~~ HOSPITAL LAB): SARS Coronavirus 2: POSITIVE — AB

## 2019-04-01 LAB — CBG MONITORING, ED
Glucose-Capillary: 362 mg/dL — ABNORMAL HIGH (ref 70–99)
Glucose-Capillary: 279 mg/dL — ABNORMAL HIGH (ref 70–99)

## 2019-04-01 MED ORDER — LORATADINE 10 MG PO TABS
10.0000 mg | ORAL_TABLET | Freq: Every day | ORAL | Status: DC
  Administered 2019-04-02 – 2019-04-04 (×3): 10 mg via ORAL
  Filled 2019-04-01 (×3): qty 1

## 2019-04-01 MED ORDER — ACETAMINOPHEN 325 MG PO TABS
650.0000 mg | ORAL_TABLET | Freq: Four times a day (QID) | ORAL | Status: DC | PRN
  Administered 2019-04-03: 650 mg via ORAL
  Filled 2019-04-01: qty 2

## 2019-04-01 MED ORDER — INSULIN ASPART 100 UNIT/ML ~~LOC~~ SOLN
0.0000 [IU] | Freq: Three times a day (TID) | SUBCUTANEOUS | Status: DC
  Administered 2019-04-02: 1 [IU] via SUBCUTANEOUS
  Administered 2019-04-02 – 2019-04-03 (×3): 3 [IU] via SUBCUTANEOUS
  Administered 2019-04-03: 2 [IU] via SUBCUTANEOUS

## 2019-04-01 MED ORDER — SODIUM CHLORIDE 0.9 % IV SOLN
500.0000 mg | Freq: Once | INTRAVENOUS | Status: AC
  Administered 2019-04-01: 500 mg via INTRAVENOUS
  Filled 2019-04-01: qty 500

## 2019-04-01 MED ORDER — SODIUM CHLORIDE 0.9 % IV SOLN
1.0000 g | Freq: Once | INTRAVENOUS | Status: AC
  Administered 2019-04-01: 1 g via INTRAVENOUS
  Filled 2019-04-01: qty 10

## 2019-04-01 MED ORDER — ENOXAPARIN SODIUM 30 MG/0.3ML ~~LOC~~ SOLN
30.0000 mg | Freq: Every day | SUBCUTANEOUS | Status: DC
  Administered 2019-04-01: 30 mg via SUBCUTANEOUS
  Filled 2019-04-01: qty 0.3

## 2019-04-01 MED ORDER — ASPIRIN EC 81 MG PO TBEC
81.0000 mg | DELAYED_RELEASE_TABLET | Freq: Every day | ORAL | Status: DC
  Administered 2019-04-02 – 2019-04-04 (×3): 81 mg via ORAL
  Filled 2019-04-01 (×3): qty 1

## 2019-04-01 MED ORDER — SODIUM CHLORIDE 0.9 % IV BOLUS
1000.0000 mL | Freq: Once | INTRAVENOUS | Status: AC
  Administered 2019-04-01: 1000 mL via INTRAVENOUS

## 2019-04-01 MED ORDER — INSULIN ASPART 100 UNIT/ML ~~LOC~~ SOLN: 0.0000 [IU] | Freq: Every day | SUBCUTANEOUS | Status: DC

## 2019-04-01 MED ORDER — BENZONATATE 100 MG PO CAPS
100.0000 mg | ORAL_CAPSULE | Freq: Three times a day (TID) | ORAL | Status: DC
  Administered 2019-04-01 – 2019-04-04 (×8): 100 mg via ORAL
  Filled 2019-04-01 (×8): qty 1

## 2019-04-01 MED ORDER — ACETAMINOPHEN 325 MG PO TABS
650.0000 mg | ORAL_TABLET | Freq: Once | ORAL | Status: AC
  Administered 2019-04-01: 650 mg via ORAL
  Filled 2019-04-01: qty 2

## 2019-04-01 MED ORDER — TRAMADOL HCL 50 MG PO TABS: 50.0000 mg | ORAL_TABLET | Freq: Two times a day (BID) | ORAL | Status: DC | PRN

## 2019-04-01 MED ORDER — IPRATROPIUM BROMIDE HFA 17 MCG/ACT IN AERS
2.0000 | INHALATION_SPRAY | Freq: Four times a day (QID) | RESPIRATORY_TRACT | Status: DC
  Administered 2019-04-01 – 2019-04-02 (×5): 2 via RESPIRATORY_TRACT
  Filled 2019-04-01: qty 12.9

## 2019-04-01 NOTE — ED Notes (Signed)
[facility] called for transport. 

## 2019-04-01 NOTE — ED Notes (Signed)
ED TO INPATIENT HANDOFF REPORT  Name/Age/Gender Amanda Shepherd 68 y.o. female  Code Status Advance Directive Documentation     Most Recent Value  Type of Advance Directive  Healthcare Power of Attorney, Living will  Pre-existing out of facility DNR order (yellow form or pink MOST form)  -  "MOST" Form in Place?  -      Home/SNF/Other Home  Chief Complaint weakness; cough  Level of Care/Admitting Diagnosis ED Disposition    ED Disposition Condition [place]: [place] [100101]  Level of Care: Med-Surg [16]  Covid Evaluation: Confirmed COVID Positive  Isolation Risk Level: Low Risk/Droplet (Less than 4L [place] supplementation)  Diagnosis: COVID-19 virus infection [[id number]]  Admitting Physician: Harvie Bridge [[id number]]  Attending Physician: Sherron Monday  Estimated length of stay: past midnight tomorrow  Certification:: I certify this patient will need inpatient services for at least 2 midnights  PT Class (Do Not Modify): Inpatient [101]  PT Acc Code (Do Not Modify): Private [1]       Medical History Past Medical History:  Diagnosis Date  . Bronchitis   . Diabetes mellitus   . Hyperlipidemia     Allergies Allergies  Allergen Reactions  . Atorvastatin Other (See Comments)    "did not feel right"    IV Location/Drains/Wounds Patient Lines/Drains/Airways Status   Active Line/Drains/Airways    Name:   Placement date:   Placement time:   Site:   Days:   Peripheral IV 04/01/19 Left Forearm   04/01/19    1342    Forearm   less than 1   External Urinary Catheter   04/01/19    1400    -   less than 1          Labs/Imaging Results for orders placed or performed during the hospital encounter of 04/01/19 (from the past 48 hour(s))  POC CBG, ED     Status: Abnormal   Collection Time: 04/01/19  1:37 PM  Result Value Ref Range   Glucose-Capillary 362 (H) 70 - 99 mg/dL  CBC with Differential     Status:  None   Collection Time: 04/01/19  1:39 PM  Result Value Ref Range   WBC 8.1 4.0 - 10.5 K/uL   RBC 4.49 3.87 - 5.11 MIL/uL   Hemoglobin 13.5 12.0 - 15.0 g/dL   HCT 41.1 36.0 - 46.0 %   MCV 91.5 80.0 - 100.0 fL   MCH 30.1 26.0 - 34.0 pg   MCHC 32.8 30.0 - 36.0 g/dL   RDW 11.6 11.5 - 15.5 %   Platelets 264 150 - 400 K/uL   nRBC 0.0 0.0 - 0.2 %   Neutrophils Relative % 78 %   Neutro Abs 6.3 1.7 - 7.7 K/uL   Lymphocytes Relative 12 %   Lymphs Abs 1.0 0.7 - 4.0 K/uL   Monocytes Relative 8 %   Monocytes Absolute 0.6 0.1 - 1.0 K/uL   Eosinophils Relative 2 %   Eosinophils Absolute 0.2 0.0 - 0.5 K/uL   Basophils Relative 0 %   Basophils Absolute 0.0 0.0 - 0.1 K/uL   Immature Granulocytes 0 %   Abs Immature Granulocytes 0.02 0.00 - 0.07 K/uL    Comment: Performed at [facility], [place] [address] [place], [place] [address]  Comprehensive metabolic panel     Status: Abnormal   Collection Time: 04/01/19  1:39 PM  Result Value Ref Range   Sodium 135  135 - 145 mmol/L   Potassium 4.1 3.5 - 5.1 mmol/L   Chloride 101 98 - 111 mmol/L   CO2 20 (L) 22 - 32 mmol/L   Glucose, Bld 404 (H) 70 - 99 mg/dL   BUN 41 (H) 8 - 23 mg/dL   Creatinine, Ser 1.81 (H) 0.44 - 1.00 mg/dL   Calcium 8.6 (L) 8.9 - 10.3 mg/dL   Total Protein 7.8 6.5 - 8.1 g/dL   Albumin 3.4 (L) 3.5 - 5.0 g/dL   AST 45 (H) 15 - 41 U/L   ALT 25 0 - 44 U/L   Alkaline Phosphatase 77 38 - 126 U/L   Total Bilirubin 0.3 0.3 - 1.2 mg/dL   GFR calc non Af Amer 29 (L) >60 mL/min   GFR calc Af Amer 33 (L) >60 mL/min   Anion gap 14 5 - 15    Comment: Performed at [facility] [place] [address] [place], [place] [address]  Lactic acid, plasma     Status: Abnormal   Collection Time: 04/01/19  2:24 PM  Result Value Ref Range   Lactic Acid, Venous 3.5 (HH) 0.5 - 1.9 mmol/L    Comment: CRITICAL RESULT CALLED TO, READ BACK BY AND VERIFIED WITH: Carmelina Peal [id number] @1543  BY V.WILKINS Performed at [facility], [place] [address] [place], [place] [address]   SARS Coronavirus 2 (CEPHEID- Performed in [place] hospital lab), Hosp Order     Status: Abnormal   Collection Time: 04/01/19  3:47 PM   Specimen: Nasopharyngeal Swab  Result Value Ref Range   SARS Coronavirus 2 POSITIVE (A) NEGATIVE    Comment: RESULT CALLED TO, READ BACK BY AND VERIFIED WITH: B.HINDERLY,PAC [id number] @0657  BY V.WILKINS  (NOTE) If result is NEGATIVE SARS-CoV-2 target nucleic acids are NOT DETECTED. The SARS-CoV-2 RNA is generally detectable in upper and lower  respiratory specimens during the acute phase of infection. The lowest  concentration of SARS-CoV-2 viral copies this assay can detect is 250  copies / mL. A negative result does not preclude SARS-CoV-2 infection  and should not be used as the sole basis for treatment or other  patient management decisions.  A negative result may occur with  improper specimen collection / handling, submission of specimen other  than nasopharyngeal swab, presence of viral mutation(s) within the  areas targeted by this assay, and inadequate number of viral copies  (<250 copies / mL). A negative result must be combined with clinical  observations, patient history, and epidemiological information. If result is POSITIVE SARS-CoV-2 target nucleic acids are DETECT ED. The SARS-CoV-2 RNA is generally detectable in upper and lower  respiratory specimens during the acute phase of infection.  Positive  results are indicative of active infection with SARS-CoV-2.  Clinical  correlation with patient history and other diagnostic information is  necessary to determine patient infection status.  Positive results do  not rule out bacterial infection or co-infection with other viruses. If result is PRESUMPTIVE POSTIVE SARS-CoV-2 nucleic acids MAY BE PRESENT.   A presumptive positive result was obtained on the submitted specimen  and confirmed on repeat testing.  While 2019 novel  coronavirus  (SARS-CoV-2) nucleic acids may be present in the submitted sample  additional confirmatory testing may be necessary for epidemiological  and / or clinical management purposes  to differentiate between  SARS-CoV-2 and other Sarbecovirus currently known to infect humans.  If clinically indicated additional testing with an alternate test  methodology (LAB74 53) is advised. The  SARS-CoV-2 RNA is generally  detectable in upper and lower respiratory specimens during the acute  phase of infection. The expected result is Negative. Fact Sheet for Patients:  StrictlyIdeas.no Fact Sheet for Healthcare Providers: BankingDealers.co.za This test is not yet approved or cleared by the [place] FDA and has been authorized for detection and/or diagnosis of SARS-CoV-2 by FDA under an Emergency Use Authorization (EUA).  This EUA will remain in effect (meaning this test can be used) for the duration of the COVID-19 declaration under Section 564(b)(1) of the Act, 21 U.S.C. section 360bbb-3(b)(1), unless the authorization is terminated or revoked sooner. Performed at [facility], [place] [address] [place], [place] [address]   Blood gas, venous (at [facility] and [facility], not at [facility])     Status: Abnormal (Preliminary result)   Collection Time: 04/01/19  4:05 PM  Result Value Ref Range   FIO2 21.00    Delivery systems ROOM AIR    pH, Ven 7.463 (H) 7.250 - 7.430   pCO2, Ven PENDING 44.0 - 60.0 mmHg   pO2, Ven 56.8 (H) 32.0 - 45.0 mmHg   O2 Saturation 88.8 %   Patient temperature 98.6    Collection site VEIN    Drawn by [phone]    Sample type VEIN     Comment: Performed at [facility], [place] [address], [place], [place] [address]  Lactic acid, plasma     Status: Abnormal   Collection Time: 04/01/19  4:09 PM  Result Value Ref Range   Lactic Acid, Venous 2.3 (HH) 0.5 - 1.9 mmol/L    Comment: CRITICAL RESULT CALLED TO,  READ BACK BY AND VERIFIED WITH: N.WEDDERBURN,RN [id number] @1711  BY V.WILKINS Performed at [place] [address], [place], [place] [address]   Troponin I - ONCE - STAT     Status: Abnormal   Collection Time: 04/01/19  4:10 PM  Result Value Ref Range   Troponin I 0.03 (HH) <0.03 ng/mL    Comment: CRITICAL RESULT CALLED TO, READ BACK BY AND VERIFIED WITH: T.SMITH,RN [id number] @1716  BY V.WILKINS Performed at [place] [address], [place],  [address]   POC CBG, ED     Status: Abnormal   Collection Time: 04/01/19  4:24 PM  Result Value Ref Range   Glucose-Capillary 279 (H) 70 - 99 mg/dL   Dg Chest Portable 1 View  Result Date: 04/01/2019 CLINICAL DATA:  68 y.o female reports for the past month feeling drained/fatigue with intermittent loose stools and decreased appetite. Reports been taking medications that her PCP and UC have prescribed her but still not feeling well. EXAM: PORTABLE CHEST 1 VIEW COMPARISON:  03/28/2019 FINDINGS: The heart size and mediastinal contours are within normal limits. Both lungs are clear. Thoracic spondylosis is noted. IMPRESSION: No active disease. Electronically Signed   By: Van Clines M.D.   On: 04/01/2019 14:12    Pending Labs Unresulted Labs (From admission, onward)    Start     Ordered   04/01/19 1409  Blood culture (routine x 2)  BLOOD CULTURE X 2,   STAT     04/01/19 1408   04/01/19 1339  Urinalysis, Routine w reflex microscopic  Once,   STAT     04/01/19 1339   Signed and Held  HIV antibody (Routine Testing)  Once,   R     Signed and Held   Signed and Held  ABO/Rh  Once,   R  Signed and Held   Signed and Held  CBC  (enoxaparin (LOVENOX)    CrCl < 30 ml/min)  Once,   R    Comments: Baseline for enoxaparin therapy IF NOT ALREADY DRAWN.  Notify MD if PLT < 100 K.    Signed and Held   Signed and Held  Creatinine, serum  (enoxaparin (LOVENOX)    CrCl < 30 ml/min)  Once,   R    Comments:  Baseline for enoxaparin therapy IF NOT ALREADY DRAWN.    Signed and Held   Signed and Held  Creatinine, serum  (enoxaparin (LOVENOX)    CrCl < 30 ml/min)  Weekly,   R    Comments: while on enoxaparin therapy.    Signed and Held   Signed and Held  D-dimer, quantitative (not at [facility])  Daily,   R     Signed and Held   Signed and Held  Comprehensive metabolic panel  Daily,   R     Signed and Held   Signed and Held  C-reactive protein  Daily,   R     Signed and Held   Signed and Held  Ferritin  Daily,   R     Signed and Held          Vitals/Pain Today's Vitals   04/01/19 2003 04/01/19 2030 04/01/19 2100 04/01/19 2130  BP: 118/70 131/69 (!) 142/70 110/74  Pulse: 97 98 (!) 102 (!) 116  Resp: (!) 21 17 (!) 21 (!) 29  Temp:      TempSrc:      SpO2: 96% 95% 99% 99%  Weight:      Height:      PainSc:    0-No pain    Isolation Precautions Droplet and Contact precautions  Medications Medications  azithromycin (ZITHROMAX) 500 mg in sodium chloride 0.9 % 250 mL IVPB (500 mg Intravenous New Bag/Given 04/01/19 2107)  sodium chloride 0.9 % bolus 1,000 mL (0 mLs Intravenous Stopped 04/01/19 1546)  acetaminophen (TYLENOL) tablet 650 mg (650 mg Oral Given 04/01/19 1535)  sodium chloride 0.9 % bolus 1,000 mL (0 mLs Intravenous Stopped 04/01/19 1814)  cefTRIAXone (ROCEPHIN) 1 g in sodium chloride 0.9 % 100 mL IVPB (0 g Intravenous Stopped 04/01/19 2015)    Mobility walks

## 2019-04-01 NOTE — ED Notes (Signed)
CRITICAL VALUE STICKER  CRITICAL VALUE: 2.3 Lactic Acid   RECEIVER (on-site recipient of call):  Juventino Slovak, RN  DATE & TIME NOTIFIED: 04/01/2019  MESSENGER (representative from lab): Evette Doffing  MD NOTIFIED: Gilford Raid MD   TIME OF NOTIFICATION: [phone]

## 2019-04-01 NOTE — ED Triage Notes (Addendum)
Pt reports for the past month feeling drained/fatigue with intermittent loose stools and decreased appetite. Reports been taking medications that her PCP and UC have prescribed her but still not feeling well. Adds that she had intermittent fevers but doesn't know how high they have been. Denies pains

## 2019-04-01 NOTE — ED Notes (Signed)
Patient has updated family.

## 2019-04-01 NOTE — ED Notes (Signed)
Date and time results received: 04/01/19 3:43 PM  (use smartphrase ".now" to insert current time)  Test: lactic Critical Value:3.5  Name of Provider Notified: Mandi RN   Orders Received? Or Actions Taken?:

## 2019-04-01 NOTE — ED Notes (Signed)
Pt OK to eat per provider. Pt given 2 ham sandwiches, cheddar cheese stick, and ice water.

## 2019-04-01 NOTE — ED Provider Notes (Signed)
Care transferred from previous provider [place], [place]. See note for full HPI.  In summation, patient with UR SX x 1 month. Seen by PCP and treated for allergies. Intermittent fevers at home. Loose stool without melena or hematochezia x 1 week. Low suspicion for acute AP pathology. Unknown COVID exposures.   Repeat lactic 2.3, down from 3.5. DG chest negative for PNA. Trop pending. If covid negative can reassess with ambulation. If hypoxia or increased SOB plan for admission Physical Exam  BP 118/70   Pulse 97   Temp 99.2 F (37.3 C) (Oral)   Resp (!) 21   Ht 5\' 6"  (1.676 m)   Wt 61 kg   SpO2 96%   BMI 21.71 kg/m   Physical Exam Vitals signs and nursing note reviewed.  Constitutional:      General: She is not in acute distress.    Appearance: She is ill-appearing.  HENT:     Head: Normocephalic.  Neck:     Musculoskeletal: Normal range of motion.  Cardiovascular:     Rate and Rhythm: Tachycardia present.  Pulmonary:     Effort: Tachypnea present.  Abdominal:     General: There is no distension.  Musculoskeletal:     Comments: Moves all 4 extremities  Skin:    Capillary Refill: Capillary refill takes less than 2 seconds.  Neurological:     Comments: Ambulating in room  Psychiatric:        Behavior: Behavior normal.    ED Course/Procedures     Procedures Labs Reviewed  SARS CORONAVIRUS 2 (HOSPITAL ORDER, [place] [facility]) - Abnormal; Notable for the following components:      Result Value   SARS Coronavirus 2 POSITIVE (*)    All other components within normal limits  COMPREHENSIVE METABOLIC PANEL - Abnormal; Notable for the following components:   CO2 20 (*)    Glucose, Bld 404 (*)    BUN 41 (*)    Creatinine, Ser 1.81 (*)    Calcium 8.6 (*)    Albumin 3.4 (*)    AST 45 (*)    GFR calc non Af Amer 29 (*)    GFR calc Af Amer 33 (*)    All other components within normal limits  LACTIC ACID, PLASMA - Abnormal; Notable for the following  components:   Lactic Acid, Venous 3.5 (*)    All other components within normal limits  LACTIC ACID, PLASMA - Abnormal; Notable for the following components:   Lactic Acid, Venous 2.3 (*)    All other components within normal limits  BLOOD GAS, VENOUS - Abnormal; Notable for the following components:   pH, Ven 7.463 (*)    pO2, Ven 56.8 (*)    All other components within normal limits  TROPONIN I - Abnormal; Notable for the following components:   Troponin I 0.03 (*)    All other components within normal limits  CBG MONITORING, ED - Abnormal; Notable for the following components:   Glucose-Capillary 362 (*)    All other components within normal limits  CBG MONITORING, ED - Abnormal; Notable for the following components:   Glucose-Capillary 279 (*)    All other components within normal limits  CULTURE, BLOOD (ROUTINE X 2)  CULTURE, BLOOD (ROUTINE X 2)  CBC WITH DIFFERENTIAL/PLATELET  URINALYSIS, ROUTINE W REFLEX MICROSCOPIC  Dg Chest 2 View  Result Date: 03/28/2019 CLINICAL DATA:  Per pt: Bad cough x 2 weeks. No injury. No Sob or wheezing. History of  diabetes, chronic bronchitis, and allergies. Nonsmokercough x 2 weeks EXAM: CHEST - 2 VIEW COMPARISON:  None. FINDINGS: Normal mediastinum and cardiac silhouette. Normal pulmonary vasculature. No evidence of effusion, infiltrate, or pneumothorax. No acute bony abnormality. IMPRESSION: No acute cardiopulmonary process. Electronically Signed   By: Suzy Bouchard M.D.   On: 03/28/2019 12:11   Dg Chest Portable 1 View  Result Date: 04/01/2019 CLINICAL DATA:  68 y.o female reports for the past month feeling drained/fatigue with intermittent loose stools and decreased appetite. Reports been taking medications that her PCP and UC have prescribed her but still not feeling well. EXAM: PORTABLE CHEST 1 VIEW COMPARISON:  03/28/2019 FINDINGS: The heart size and mediastinal contours are within normal limits. Both lungs are clear. Thoracic spondylosis is  noted. IMPRESSION: No active disease. Electronically Signed   By: Van Clines M.D.   On: 04/01/2019 14:12   MDM  Care transferred from previous provider [place], [place]. See note for full HPI.  In summation, patient with UR SX x 1 month. Seen by PCP and treated for allergies. Intermittent fevers at home. Loose stool without melena or hematochezia x 1 week. Unknown COVID exposures.   Repeat lactic 2.3, down from 3.5. DG chest negative for PNA. Trop pending. If covid negative can reassess with ambulation. If hypoxia or increased SOB plan for admission.  Troponin 0.03 however denies CP. Continues to be SOB, 93% on RA however with dyspnea with mild exertion currently on 2L nasal cannula. COVID positive. Given ABX for respiratory pathogen and IVF.  Patient will need admission for hypoxia with positive COVID.  1912: Consulted with Dr. Ara Kussmaul who agrees to evaluate patient for admission for COVID with hypoxia and SIRS.  1. SIRS 2. COVID positive        Zilphia Kozinski A, PA-C 04/01/19 2035    Isla Pence, MD 04/01/19 2049

## 2019-04-01 NOTE — Plan of Care (Signed)
Discussed with patient plan of care for the evening, pain management and encourage use of incentive spirometry with some teach back displayed

## 2019-04-01 NOTE — ED Provider Notes (Signed)
[facility] DEPT Provider Note   CSN: [id number] Arrival date & time: 04/01/19  1253    History   Chief Complaint Chief Complaint  Patient presents with  . Fatigue    HPI Amanda Shepherd is a 68 y.o. female.     HPI   Pt is a 68 y/o female with a h/o bronchitis, DM, HLD, who presents to the ED today for evaluation of a productive cough that began about 1 month ago. States that her cough started after she was mowing the lawn and she thinks that this triggered her cough. States she has severe allergies and has been started on multiple medications for her allergies and other sxs.  Denies SOB or chest pain. Reports mild diarrhea for the last week, but no vomiting. No bloody stools. Reports decreased appetite.  Reports urinary frequency and dry mouth. States she feels dehydrated.  States she has been feeling more and more weak and fatigued. States she is feeling like she has to hold onto the walls when she is walking because she is off balance and feels so drained. Denies unilateral weakness or numbness. No headaches. No vision changes. No vertiginous sxs.  Past Medical History:  Diagnosis Date  . Bronchitis   . Diabetes mellitus   . Hyperlipidemia     Patient Active Problem List   Diagnosis Date Noted  . Nonproductive cough 03/25/2018  . Mild non proliferative diabetic retinopathy ([place]) 06/04/2014  . Cough in adult 10/27/2013  . Primary hypertension 09/22/2010  . Uncontrolled type 2 diabetes mellitus with mild nonproliferative retinopathy, without long-term current use of insulin ([place]) 02/14/2007  . Hyperlipidemia associated with type 2 diabetes mellitus ([place]) 02/14/2007    Past Surgical History:  Procedure Laterality Date  . ABDOMINAL HYSTERECTOMY       OB History   No obstetric history on file.      Home Medications    Prior to Admission medications   Medication Sig Start Date End Date Taking? Authorizing Provider  acetaminophen  (TYLENOL) 325 MG tablet Take 650 mg by mouth at bedtime as needed for fever.   Yes [provider]  albuterol (VENTOLIN HFA) 108 (90 Base) MCG/ACT inhaler Inhale 1-2 puffs into the lungs every 6 (six) hours as needed for wheezing or shortness of breath. 03/28/19  Yes Bast, Traci A, NP  aspirin EC 81 MG tablet Take 81 mg by mouth daily.   Yes [provider]  benzonatate (TESSALON) 100 MG capsule Take 1 capsule (100 mg total) by mouth every 8 (eight) hours. 03/28/19  Yes Bast, Traci A, NP  cetirizine (ZYRTEC) 10 MG tablet Take 1 tablet (10 mg total) by mouth daily. 03/28/19  Yes Bast, Traci A, NP  dapagliflozin propanediol (FARXIGA) 10 MG TABS tablet Take 10 mg by mouth daily. 01/04/19  Yes [place] Bing, DO  fluticasone (FLONASE) 50 MCG/ACT nasal spray Place 1 spray into both nostrils daily. 03/28/19  Yes Bast, Traci A, NP  guaiFENesin (ROBITUSSIN) 100 MG/5ML liquid Take 200 mg by mouth 3 (three) times daily as needed for cough.   Yes [provider]  lisinopril (PRINIVIL,ZESTRIL) 10 MG tablet Take 1 tablet (10 mg total) by mouth daily. 09/21/17  Yes [place] Bing, DO  metFORMIN (GLUCOPHAGE) 1000 MG tablet TAKE 1 TABLET BY MOUTH TWICE DAILY WITH A MEAL Patient taking differently: Take 1,000 mg by mouth 2 (two) times daily with a meal.  01/04/19  Yes [place] Bing, DO  Blood Glucose Monitoring  Suppl (ONE TOUCH ULTRA 2) w/Device KIT 1 Device by Does not apply route 2 (two) times daily. 05/31/17   [place] Bing, DO  glucose blood (ONE TOUCH ULTRA TEST) test strip Check sugar 2 x daily 05/31/17   [place] Bing, DO  Insulin Pen Needle (BD PEN NEEDLE NANO U/F) 32G X 4 MM MISC Use 1 pen needle with each insulin dose once a day 06/10/17   Zenia Resides, MD  [facility] DELICA LANCETS 00B MISC 1 Device by Does not apply route 2 (two) times daily. 01/18/18   [place] Bing, DO    Family History Family History  Problem Relation Age of Onset  . Hypertension Mother      Social History Social History   Tobacco Use  . Smoking status: Never Smoker  . Smokeless tobacco: Never Used  Substance Use Topics  . Alcohol use: No  . Drug use: No     Allergies   Atorvastatin   Review of Systems Review of Systems  Constitutional: Positive for appetite change and fever.  HENT: Negative for ear pain and sore throat.   Eyes: Negative for visual disturbance.  Respiratory: Positive for cough. Negative for shortness of breath.   Cardiovascular: Negative for chest pain and palpitations.  Gastrointestinal: Positive for diarrhea. Negative for abdominal pain, blood in stool, constipation, nausea and vomiting.  Endocrine: Positive for polyuria.  Genitourinary: Positive for frequency. Negative for dysuria and hematuria.  Musculoskeletal: Negative for back pain.  Skin: Negative for color change and rash.  Neurological: Negative for headaches.  All other systems reviewed and are negative.    Physical Exam Updated Vital Signs BP 116/65   Pulse 96   Temp 99.2 F (37.3 C) (Oral)   Resp 19   Ht 5' 6"  (1.676 m)   Wt 61 kg   SpO2 93%   BMI 21.71 kg/m   Physical Exam Vitals signs and nursing note reviewed.  Constitutional:      General: She is not in acute distress.    Appearance: She is well-developed.  HENT:     Head: Normocephalic and atraumatic.     Mouth/Throat:     Mouth: Mucous membranes are dry.  Eyes:     Extraocular Movements: Extraocular movements intact.     Conjunctiva/sclera: Conjunctivae normal.     Pupils: Pupils are equal, round, and reactive to light.     Comments: No nystagmus  Neck:     Musculoskeletal: Neck supple.  Cardiovascular:     Rate and Rhythm: Regular rhythm. Tachycardia present.     Pulses: Normal pulses.     Heart sounds: Normal heart sounds. No murmur.  Pulmonary:     Effort: Pulmonary effort is normal. No respiratory distress.     Breath sounds: Normal breath sounds. No wheezing, rhonchi or rales.  Abdominal:      General: Bowel sounds are normal.     Palpations: Abdomen is soft.     Tenderness: There is no abdominal tenderness. There is no guarding or rebound.  Musculoskeletal:        General: No tenderness.     Right lower leg: No edema.     Left lower leg: No edema.  Skin:    General: Skin is warm and dry.  Neurological:     General: No focal deficit present.     Mental Status: She is alert and oriented to person, place, and time.     Cranial Nerves: No cranial nerve deficit.  Comments: Mental Status:  Alert, thought content appropriate, able to give a coherent history. Speech fluent without evidence of aphasia. Able to follow 2 step commands without difficulty.  Motor:  Normal tone. 5/5 strength of BUE and BLE major muscle groups including strong and equal grip strength and dorsiflexion/plantar flexion Sensory: light touch normal in all extremities. Cerebellar: normal finger-to-nose with bilateral upper extremities     ED Treatments / Results  Labs (all labs ordered are listed, but only abnormal results are displayed) Labs Reviewed  COMPREHENSIVE METABOLIC PANEL - Abnormal; Notable for the following components:      Result Value   CO2 20 (*)    Glucose, Bld 404 (*)    BUN 41 (*)    Creatinine, Ser 1.81 (*)    Calcium 8.6 (*)    Albumin 3.4 (*)    AST 45 (*)    GFR calc non Af Amer 29 (*)    GFR calc Af Amer 33 (*)    All other components within normal limits  LACTIC ACID, PLASMA - Abnormal; Notable for the following components:   Lactic Acid, Venous 3.5 (*)    All other components within normal limits  LACTIC ACID, PLASMA - Abnormal; Notable for the following components:   Lactic Acid, Venous 2.3 (*)    All other components within normal limits  BLOOD GAS, VENOUS - Abnormal; Notable for the following components:   pH, Ven 7.463 (*)    pO2, Ven 56.8 (*)    All other components within normal limits  CBG MONITORING, ED - Abnormal; Notable for the following components:    Glucose-Capillary 362 (*)    All other components within normal limits  CBG MONITORING, ED - Abnormal; Notable for the following components:   Glucose-Capillary 279 (*)    All other components within normal limits  CULTURE, BLOOD (ROUTINE X 2)  CULTURE, BLOOD (ROUTINE X 2)  SARS CORONAVIRUS 2 (HOSPITAL ORDER, [facility])  CBC WITH DIFFERENTIAL/PLATELET  URINALYSIS, ROUTINE W REFLEX MICROSCOPIC  TROPONIN I    EKG EKG Interpretation  Date/Time:  Saturday April 01 2019 13:18:55 EDT Ventricular Rate:  114 PR Interval:    QRS Duration: 72 QT Interval:  289 QTC Calculation: 398 R Axis:   -7 Text Interpretation:  Sinus tachycardia Borderline low voltage, extremity leads No significant change since last tracing Confirmed by Isla Pence [phone]) on 04/01/2019 1:34:39 PM   Radiology Dg Chest Portable 1 View  Result Date: 04/01/2019 CLINICAL DATA:  68 y.o female reports for the past month feeling drained/fatigue with intermittent loose stools and decreased appetite. Reports been taking medications that her PCP and UC have prescribed her but still not feeling well. EXAM: PORTABLE CHEST 1 VIEW COMPARISON:  03/28/2019 FINDINGS: The heart size and mediastinal contours are within normal limits. Both lungs are clear. Thoracic spondylosis is noted. IMPRESSION: No active disease. Electronically Signed   By: Van Clines M.D.   On: 04/01/2019 14:12    Procedures Procedures (including critical care time)  Medications Ordered in ED Medications  sodium chloride 0.9 % bolus 1,000 mL (0 mLs Intravenous Stopped 04/01/19 1546)  acetaminophen (TYLENOL) tablet 650 mg (650 mg Oral Given 04/01/19 1535)  sodium chloride 0.9 % bolus 1,000 mL (1,000 mLs Intravenous New Bag/Given 04/01/19 1546)     Initial Impression / Assessment and Plan / ED Course  I have reviewed the triage vital signs and the nursing notes.  Pertinent labs & imaging results that were available  during my  care of the patient were reviewed by me and considered in my medical decision making (see chart for details).     Final Clinical Impressions(s) / ED Diagnoses   Final diagnoses:  None   Pt presenting with URI sxs x1 month, diarrhea x1 week, and generalized fatigue worsening over 1 month. Found to be febrile here with tachycardia. BP initially somewhat soft, IVF given and pressures improved.   CBC is without leukocytosis, no anemia CMP with low bicarb at 20. Elevated blood glucose at 404. No elevated anion gap. Elevated BUN/Cr at 41/1.81 up from 1 year ago.  Initial lactic acid 3.5, improving on reassessment. Trop pending at shift change VBG with mild alkalosis, pCO2 is low Blood cultures obtained UA pending at time of shift change.  EKG Sinus tachycardia Borderline low voltage, extremity leads No significant change since last tracing  CXR is negative   3:00 PM Reassessed pt. She states she feels somewhat improved after IVF. HR improving to the low 100s. BP improving to 783 systolic. satting well on RA. Pt drinking lemonade at bedside. Advised to only drink water with elevated blood sugars.   Pt care transitioned to [facility], PA-C at shift change pending, UA, Trop, COVID, and repeat lactic acid. Pt will need reassessment  ED Discharge Orders    None       Bishop Dublin 04/01/19 1716    Isla Pence, MD 04/01/19 1939

## 2019-04-01 NOTE — ED Notes (Signed)
Date and time results received: 06/13/201716 (use smartphrase ".now" to insert current time)  Test: Troponin Critical Value: 0.03  Name of Provider Notified: P.A. Cortni  Orders Received? Or Actions Taken?:

## 2019-04-01 NOTE — H&P (Signed)
History and Physical   [facility] [place] [facility] [place] Admission History and Physical [employer], D.O.    Patient Name: Amanda Shepherd MR#: [id number] Date of Birth: 03-Apr-1951 Date of Admission: 04/01/2019  Referring MD/NP/PA: PA Britni Primary Care Physician: [place] Bing, DO  Chief Complaint:  Chief Complaint  Patient presents with  . Fatigue    HPI: Amanda Shepherd is a 68 y.o. female with a known history of bronchitis, DM, HLD presents to the emergency department for evaluation of one month of cough.  Patient was in a usual state of health until one month ago, she was mowing the lawn and developed a cough productive of green sputum  which she attributed to her allergies.  She was seen via telemedicine 2 days ago and treated for allergies. Symptoms have been refractory to Flonase, Albuterol, Tessalon.   Patient denies fevers/chills, dizziness, chest pain, shortness of breath, N/V/C/D, abdominal pain, dysuria/frequency, changes in mental status.    Otherwise there has been no change in status. Patient has been taking medication as prescribed and there has been no recent change in medication or diet.  No recent antibiotics.  There has been no recent illness, hospitalizations, travel or sick contacts.    EMS/ED Course: Patient received Rocephin and azithromycin. Medical admission has been requested for further management of COVID pneumonia.  Review of Systems:  CONSTITUTIONAL: Positive fatigue and weakness.  No fever/chills, weight gain/loss, headache. EYES: No blurry or double vision. ENT: No tinnitus, postnasal drip, redness or soreness of the oropharynx. RESPIRATORY: Positive cough, negative dyspnea, wheeze.  No hemoptysis.  CARDIOVASCULAR: No chest pain, palpitations, syncope, orthopnea. No lower extremity edema.  GASTROINTESTINAL: No nausea, vomiting, abdominal pain, diarrhea, constipation.  No hematemesis, melena or hematochezia. GENITOURINARY: No dysuria,  frequency, hematuria. ENDOCRINE: No polyuria or nocturia. No heat or cold intolerance. HEMATOLOGY: No anemia, bruising, bleeding. INTEGUMENTARY: No rashes, ulcers, lesions. MUSCULOSKELETAL: No arthritis, gout, dyspnea. NEUROLOGIC: No numbness, tingling, ataxia, seizure-type activity, weakness. PSYCHIATRIC: No anxiety, depression, insomnia.   Past Medical History:  Diagnosis Date  . Bronchitis   . Diabetes mellitus   . Hyperlipidemia     Past Surgical History:  Procedure Laterality Date  . ABDOMINAL HYSTERECTOMY       reports that she has never smoked. She has never used smokeless tobacco. She reports that she does not drink alcohol or use drugs.  Allergies  Allergen Reactions  . Atorvastatin Other (See Comments)    "did not feel right"    Family History  Problem Relation Age of Onset  . Hypertension Mother     Prior to Admission medications   Medication Sig Start Date End Date Taking? Authorizing Provider  acetaminophen (TYLENOL) 325 MG tablet Take 650 mg by mouth at bedtime as needed for fever.   Yes [provider]  albuterol (VENTOLIN HFA) 108 (90 Base) MCG/ACT inhaler Inhale 1-2 puffs into the lungs every 6 (six) hours as needed for wheezing or shortness of breath. 03/28/19  Yes Bast, Traci A, NP  aspirin EC 81 MG tablet Take 81 mg by mouth daily.   Yes [provider]  benzonatate (TESSALON) 100 MG capsule Take 1 capsule (100 mg total) by mouth every 8 (eight) hours. 03/28/19  Yes Bast, Traci A, NP  cetirizine (ZYRTEC) 10 MG tablet Take 1 tablet (10 mg total) by mouth daily. 03/28/19  Yes Bast, Traci A, NP  dapagliflozin propanediol (FARXIGA) 10 MG TABS tablet Take 10 mg by mouth daily. 01/04/19  Yes  [place] Bing, DO  fluticasone (FLONASE) 50 MCG/ACT nasal spray Place 1 spray into both nostrils daily. 03/28/19  Yes Bast, Traci A, NP  guaiFENesin (ROBITUSSIN) 100 MG/5ML liquid Take 200 mg by mouth 3 (three) times daily as needed for cough.   Yes [provider]  lisinopril (PRINIVIL,ZESTRIL) 10 MG tablet Take 1 tablet (10 mg total) by mouth daily. 09/21/17  Yes [place] Bing, DO  metFORMIN (GLUCOPHAGE) 1000 MG tablet TAKE 1 TABLET BY MOUTH TWICE DAILY WITH A MEAL Patient taking differently: Take 1,000 mg by mouth 2 (two) times daily with a meal.  01/04/19  Yes [place] Bing, DO  Blood Glucose Monitoring Suppl (ONE TOUCH ULTRA 2) w/Device KIT 1 Device by Does not apply route 2 (two) times daily. 05/31/17   [place] Bing, DO  glucose blood (ONE TOUCH ULTRA TEST) test strip Check sugar 2 x daily 05/31/17   [place] Bing, DO  Insulin Pen Needle (BD PEN NEEDLE NANO U/F) 32G X 4 MM MISC Use 1 pen needle with each insulin dose once a day 06/10/17   Zenia Resides, MD  [facility] DELICA LANCETS 78S MISC 1 Device by Does not apply route 2 (two) times daily. 01/18/18   [place] Bing, DO    Physical Exam: Vitals:   04/01/19 1815 04/01/19 1847 04/01/19 1900 04/01/19 1930  BP:  119/63 120/68   Pulse: 91 93 94   Resp: 18 17 (!) 26   Temp:      TempSrc:      SpO2: 94% 93% 94% 99%  Weight:      Height:        GENERAL: 68 y.o.-year-old female patient, well-developed, well-nourished lying in the bed in no acute distress.  Pleasant and cooperative.   HEENT: Head atraumatic, normocephalic. Pupils equal. Mucus membranes dry. NECK: Supple. No JVD. CHEST: Normal breath sounds bilaterally. No wheezing, rales, rhonchi.  No accessory muscles of respiration.   CARDIOVASCULAR: S1, S2 normal. No murmurs, rubs, or gallops. Cap refill <2 seconds. Pulses intact distally.  ABDOMEN: Soft, nondistended, nontender. No rebound, guarding, rigidity. EXTREMITIES: No pedal edema, cyanosis, or clubbing. No calf tenderness or Homan's sign.  NEUROLOGIC: The patient is alert and oriented x 3. Cranial nerves II through XII are grossly intact with no focal sensorimotor deficit. PSYCHIATRIC:  Normal affect, mood, thought content.   Labs on  Admission:  CBC: Recent Labs  Lab 04/01/19 1339  WBC 8.1  NEUTROABS 6.3  HGB 13.5  HCT 41.1  MCV 91.5  PLT 128   Basic Metabolic Panel: Recent Labs  Lab 04/01/19 1339  NA 135  K 4.1  CL 101  CO2 20*  GLUCOSE 404*  BUN 41*  CREATININE 1.81*  CALCIUM 8.6*   GFR: Estimated Creatinine Clearance: 28.6 mL/min (A) (by C-G formula based on SCr of 1.81 mg/dL (H)). Liver Function Tests: Recent Labs  Lab 04/01/19 1339  AST 45*  ALT 25  ALKPHOS 77  BILITOT 0.3  PROT 7.8  ALBUMIN 3.4*   No results for input(s): LIPASE, AMYLASE in the last 168 hours. No results for input(s): AMMONIA in the last 168 hours. Coagulation Profile: No results for input(s): INR, PROTIME in the last 168 hours. Cardiac Enzymes: Recent Labs  Lab 04/01/19 1610  TROPONINI 0.03*   BNP (last 3 results) No results for input(s): PROBNP in the last 8760 hours. HbA1C: No results for input(s): HGBA1C in the last 72 hours. CBG: Recent Labs  Lab 03/28/19 1237 04/01/19  1337 04/01/19 1624  GLUCAP 338* 362* 279*   Lipid Profile: No results for input(s): CHOL, HDL, LDLCALC, TRIG, CHOLHDL, LDLDIRECT in the last 72 hours. Thyroid Function Tests: No results for input(s): TSH, T4TOTAL, FREET4, T3FREE, THYROIDAB in the last 72 hours. Anemia Panel: No results for input(s): VITAMINB12, FOLATE, FERRITIN, TIBC, IRON, RETICCTPCT in the last 72 hours. Urine analysis:    Component Value Date/Time   COLORURINE YELLOW 04/23/2014 2127   APPEARANCEUR CLOUDY (A) 04/23/2014 2127   LABSPEC 1.020 04/23/2014 2127   PHURINE 5.0 04/23/2014 2127   GLUCOSEU NEGATIVE 04/23/2014 2127   HGBUR TRACE (A) 04/23/2014 2127   HGBUR trace-intact 05/05/2007 [place] NEGATIVE 04/23/2014 2127   [place] NEGATIVE 04/23/2014 2127   PROTEINUR 30 (A) 04/23/2014 2127   UROBILINOGEN 0.2 04/23/2014 2127   NITRITE POSITIVE (A) 04/23/2014 2127   LEUKOCYTESUR MODERATE (A) 04/23/2014 2127   Sepsis  Labs: _0 (procalcitonin:4,lacticidven:4) ) Recent Results (from the past 240 hour(s))  SARS Coronavirus 2 (CEPHEID- Performed in [facility] hospital lab), Hosp Order     Status: Abnormal   Collection Time: 04/01/19  3:47 PM   Specimen: Nasopharyngeal Swab  Result Value Ref Range Status   SARS Coronavirus 2 POSITIVE (A) NEGATIVE Final    Comment: RESULT CALLED TO, READ BACK BY AND VERIFIED WITH: B.HINDERLY,PAC [id number] _1  BY V.WILKINS  (NOTE) If result is NEGATIVE SARS-CoV-2 target nucleic acids are NOT DETECTED. The SARS-CoV-2 RNA is generally detectable in upper and lower  respiratory specimens during the acute phase of infection. The lowest  concentration of SARS-CoV-2 viral copies this assay can detect is 250  copies / mL. A negative result does not preclude SARS-CoV-2 infection  and should not be used as the sole basis for treatment or other  patient management decisions.  A negative result may occur with  improper specimen collection / handling, submission of specimen other  than nasopharyngeal swab, presence of viral mutation(s) within the  areas targeted by this assay, and inadequate number of viral copies  (<250 copies / mL). A negative result must be combined with clinical  observations, patient history, and epidemiological information. If result is POSITIVE SARS-CoV-2 target nucleic acids are DETECT ED. The SARS-CoV-2 RNA is generally detectable in upper and lower  respiratory specimens during the acute phase of infection.  Positive  results are indicative of active infection with SARS-CoV-2.  Clinical  correlation with patient history and other diagnostic information is  necessary to determine patient infection status.  Positive results do  not rule out bacterial infection or co-infection with other viruses. If result is PRESUMPTIVE POSTIVE SARS-CoV-2 nucleic acids MAY BE PRESENT.   A presumptive positive result was obtained on the submitted specimen  and  confirmed on repeat testing.  While 2019 novel coronavirus  (SARS-CoV-2) nucleic acids may be present in the submitted sample  additional confirmatory testing may be necessary for epidemiological  and / or clinical management purposes  to differentiate between  SARS-CoV-2 and other Sarbecovirus currently known to infect humans.  If clinically indicated additional testing with an alternate test  methodology (LAB74 [id number]) is advised. The SARS-CoV-2 RNA is generally  detectable in upper and lower respiratory specimens during the acute  phase of infection. The expected result is Negative. Fact Sheet for Patients:  StrictlyIdeas.no Fact Sheet for Healthcare Providers: BankingDealers.co.za This test is not yet approved or cleared by the [place] FDA and has been authorized for detection and/or diagnosis of SARS-CoV-2 by FDA under an Emergency Use Authorization (  EUA).  This EUA will remain in effect (meaning this test can be used) for the duration of the COVID-19 declaration under Section 564(b)(1) of the Act, 21 U.S.C. section 360bbb-3(b)(1), unless the authorization is terminated or revoked sooner. Performed at [facility], [place] [address], [place], [place] [address]      Radiological Exams on Admission: Dg Chest Portable 1 View  Result Date: 04/01/2019 CLINICAL DATA:  68 y.o female reports for the past month feeling drained/fatigue with intermittent loose stools and decreased appetite. Reports been taking medications that her PCP and UC have prescribed her but still not feeling well. EXAM: PORTABLE CHEST 1 VIEW COMPARISON:  03/28/2019 FINDINGS: The heart size and mediastinal contours are within normal limits. Both lungs are clear. Thoracic spondylosis is noted. IMPRESSION: No active disease. Electronically Signed   By: Van Clines M.D.   On: 04/01/2019 14:12    EKG: Sinus tach at 114 bpm with normal axis and  nonspecific ST-T wave changes.   Assessment/Plan  This is a 68 y.o. female with a history of DM, HLD now being admitted with:  #. Sepsis, COVID-19 - [place] - IV Rocephin and Azithromycin - O2, nebs PRN - Follow up sputum culture and viral PCR - Check LDH, ferritin, CRP, DDimer  #. AKI - Repeat BMP in AM  #.  Hyperglycemia without DKA - Accuchecks achs with RISS coverage - Heart healthy, carb controlled diet  #. History of HLD - Continue home medication  Admission status: Inpatient IV Fluids: HL Diet/Nutrition: Heart healthy, carb controlled Consults called: None  DVT Px: Lovenox, SCDs and early ambulation. Code Status: Full Code  Disposition Plan: To home in 2-3 days  All the records are reviewed and case discussed with ED provider. Management plans discussed with the patient and/or family who express understanding and agree with plan of care.  Shelia Magallon D.O. on 04/01/2019 at 7:44 PM CC: Primary care physician; [place] Bing, DO   04/01/2019, 7:44 PM

## 2019-04-02 DIAGNOSIS — U071 COVID-19: Principal | ICD-10-CM

## 2019-04-02 DIAGNOSIS — I10 Essential (primary) hypertension: Secondary | ICD-10-CM

## 2019-04-02 DIAGNOSIS — Z794 Long term (current) use of insulin: Secondary | ICD-10-CM

## 2019-04-02 DIAGNOSIS — E119 Type 2 diabetes mellitus without complications: Secondary | ICD-10-CM

## 2019-04-02 DIAGNOSIS — N179 Acute kidney failure, unspecified: Secondary | ICD-10-CM

## 2019-04-02 LAB — URINALYSIS, ROUTINE W REFLEX MICROSCOPIC
Bilirubin Urine: NEGATIVE
Bilirubin Urine: NEGATIVE
Glucose, UA: >=500 mg/dL — AB
Glucose, UA: >=500 mg/dL — AB
Ketones, ur: 5 mg/dL — AB
Ketones, ur: 5 mg/dL — AB
Leukocytes,Ua: NEGATIVE
Leukocytes,Ua: NEGATIVE
Nitrite: NEGATIVE
Nitrite: NEGATIVE
Protein, ur: >=300 mg/dL — AB
Protein, ur: >=300 mg/dL — AB
Specific Gravity, Urine: 1.027 (ref 1.005–1.030)
Specific Gravity, Urine: 1.027 (ref 1.005–1.030)
pH: 5 (ref 5.0–8.0)
pH: 5 (ref 5.0–8.0)

## 2019-04-02 LAB — RESPIRATORY PANEL BY PCR

## 2019-04-02 LAB — CBC
HCT: 36.7 % (ref 36.0–46.0)
HCT: 38 % (ref 36.0–46.0)
Hemoglobin: 12 g/dL (ref 12.0–15.0)
Hemoglobin: 12.4 g/dL (ref 12.0–15.0)
MCH: 30.2 pg (ref 26.0–34.0)
MCH: 30.2 pg (ref 26.0–34.0)
MCHC: 32.6 g/dL (ref 30.0–36.0)
MCHC: 32.7 g/dL (ref 30.0–36.0)
MCV: 92.2 fL (ref 80.0–100.0)
MCV: 92.5 fL (ref 80.0–100.0)
Platelets: 235 10*3/uL (ref 150–400)
Platelets: 259 10*3/uL (ref 150–400)
RBC: 3.98 MIL/uL (ref 3.87–5.11)
RBC: 4.11 MIL/uL (ref 3.87–5.11)
RDW: 11.5 % (ref 11.5–15.5)
RDW: 11.6 % (ref 11.5–15.5)
WBC: 7.7 10*3/uL (ref 4.0–10.5)
WBC: 8 10*3/uL (ref 4.0–10.5)
nRBC: 0 % (ref 0.0–0.2)
nRBC: 0 % (ref 0.0–0.2)

## 2019-04-02 LAB — COMPREHENSIVE METABOLIC PANEL
ALT: 23 U/L (ref 0–44)
ALT: 26 U/L (ref 0–44)
AST: 51 U/L — ABNORMAL HIGH (ref 15–41)
AST: 65 U/L — ABNORMAL HIGH (ref 15–41)
Albumin: 2.9 g/dL — ABNORMAL LOW (ref 3.5–5.0)
Albumin: 2.6 g/dL — ABNORMAL LOW (ref 3.5–5.0)
Alkaline Phosphatase: 65 U/L (ref 38–126)
Alkaline Phosphatase: 65 U/L (ref 38–126)
Anion gap: 11 (ref 5–15)
Anion gap: 11 (ref 5–15)
BUN: 27 mg/dL — ABNORMAL HIGH (ref 8–23)
BUN: 22 mg/dL (ref 8–23)
CO2: 21 mmol/L — ABNORMAL LOW (ref 22–32)
CO2: 22 mmol/L (ref 22–32)
Calcium: 8.2 mg/dL — ABNORMAL LOW (ref 8.9–10.3)
Calcium: 8.2 mg/dL — ABNORMAL LOW (ref 8.9–10.3)
Chloride: 108 mmol/L (ref 98–111)
Chloride: 107 mmol/L (ref 98–111)
Creatinine, Ser: 1.17 mg/dL — ABNORMAL HIGH (ref 0.44–1.00)
Creatinine, Ser: 1.15 mg/dL — ABNORMAL HIGH (ref 0.44–1.00)
GFR calc Af Amer: 56 mL/min — ABNORMAL LOW (ref >60)
GFR calc Af Amer: 57 mL/min — ABNORMAL LOW (ref >60)
GFR calc non Af Amer: 49 mL/min — ABNORMAL LOW (ref >60)
GFR calc non Af Amer: 50 mL/min — ABNORMAL LOW (ref >60)
Glucose, Bld: 191 mg/dL — ABNORMAL HIGH (ref 70–99)
Glucose, Bld: 210 mg/dL — ABNORMAL HIGH (ref 70–99)
Potassium: 4.2 mmol/L (ref 3.5–5.1)
Potassium: 3.8 mmol/L (ref 3.5–5.1)
Sodium: 140 mmol/L (ref 135–145)
Sodium: 140 mmol/L (ref 135–145)
Total Bilirubin: 0.4 mg/dL (ref 0.3–1.2)
Total Bilirubin: 0.4 mg/dL (ref 0.3–1.2)
Total Protein: 6.9 g/dL (ref 6.5–8.1)
Total Protein: 6.7 g/dL (ref 6.5–8.1)

## 2019-04-02 LAB — PROCALCITONIN: Procalcitonin: 0.35 ng/mL

## 2019-04-02 LAB — D-DIMER, QUANTITATIVE
D-Dimer, Quant: 3.41 ug/mL-FEU — ABNORMAL HIGH (ref 0.00–0.50)
D-Dimer, Quant: 3.53 ug/mL-FEU — ABNORMAL HIGH (ref 0.00–0.50)
D-Dimer, Quant: 3.57 ug/mL-FEU — ABNORMAL HIGH (ref 0.00–0.50)

## 2019-04-02 LAB — BLOOD CULTURE ID PANEL (REFLEXED)

## 2019-04-02 LAB — ABO/RH: ABO/RH(D): O POS

## 2019-04-02 LAB — GLUCOSE, CAPILLARY
Glucose-Capillary: 158 mg/dL — ABNORMAL HIGH (ref 70–99)
Glucose-Capillary: 198 mg/dL — ABNORMAL HIGH (ref 70–99)
Glucose-Capillary: 121 mg/dL — ABNORMAL HIGH (ref 70–99)

## 2019-04-02 LAB — FERRITIN: Ferritin: 516 ng/mL — ABNORMAL HIGH (ref 11–307)

## 2019-04-02 LAB — HIV ANTIBODY (ROUTINE TESTING W REFLEX): HIV Screen 4th Generation wRfx: NONREACTIVE

## 2019-04-02 LAB — C-REACTIVE PROTEIN
CRP: 8 mg/dL — ABNORMAL HIGH (ref <1.0)
CRP: 8.9 mg/dL — ABNORMAL HIGH (ref <1.0)

## 2019-04-02 LAB — LACTATE DEHYDROGENASE: LDH: 322 U/L — ABNORMAL HIGH (ref 98–192)

## 2019-04-02 LAB — HEMOGLOBIN A1C
Hgb A1c MFr Bld: 10.2 % — ABNORMAL HIGH (ref 4.8–5.6)
Mean Plasma Glucose: 246.04 mg/dL

## 2019-04-02 LAB — MAGNESIUM: Magnesium: 2.3 mg/dL (ref 1.7–2.4)

## 2019-04-02 MED ORDER — LACTATED RINGERS IV SOLN
INTRAVENOUS | Status: DC
  Administered 2019-04-02: 10:00:00 via INTRAVENOUS

## 2019-04-02 MED ORDER — DOXYCYCLINE HYCLATE 100 MG PO TABS
100.0000 mg | ORAL_TABLET | Freq: Two times a day (BID) | ORAL | Status: DC
  Administered 2019-04-02 – 2019-04-04 (×5): 100 mg via ORAL
  Filled 2019-04-02 (×5): qty 1

## 2019-04-02 MED ORDER — INSULIN GLARGINE 100 UNIT/ML ~~LOC~~ SOLN
15.0000 [IU] | Freq: Every day | SUBCUTANEOUS | Status: DC
  Administered 2019-04-02 – 2019-04-04 (×3): 15 [IU] via SUBCUTANEOUS
  Filled 2019-04-02 (×3): qty 0.15

## 2019-04-02 MED ORDER — SODIUM CHLORIDE 0.9 % IV SOLN
1.0000 g | Freq: Once | INTRAVENOUS | Status: AC
  Administered 2019-04-02: 1 g via INTRAVENOUS
  Filled 2019-04-02: qty 10

## 2019-04-02 MED ORDER — SODIUM CHLORIDE 0.9 % IV SOLN
2.0000 g | INTRAVENOUS | Status: AC
  Administered 2019-04-03 – 2019-04-04 (×2): 2 g via INTRAVENOUS
  Filled 2019-04-02 (×2): qty 2

## 2019-04-02 MED ORDER — ENOXAPARIN SODIUM 40 MG/0.4ML ~~LOC~~ SOLN
40.0000 mg | Freq: Every day | SUBCUTANEOUS | Status: DC
  Administered 2019-04-02 – 2019-04-03 (×2): 40 mg via SUBCUTANEOUS
  Filled 2019-04-02 (×2): qty 0.4

## 2019-04-02 MED ORDER — SODIUM CHLORIDE 0.9 % IV SOLN
1.0000 g | INTRAVENOUS | Status: DC
  Administered 2019-04-02: 1 g via INTRAVENOUS
  Filled 2019-04-02: qty 10

## 2019-04-02 NOTE — Progress Notes (Signed)
PROGRESS NOTE                                                                                                                                                                                                             Patient Demographics:    Amanda Shepherd, is a 68 y.o. female, DOB - 1950-11-04, RUE:[id number]  Outpatient Primary MD for the patient is Beauregard Bing, DO    LOS - 1  Admit date - 04/01/2019    Chief Complaint  Patient presents with  . Fatigue       Brief Narrative  Amanda Shepherd is a 68 y.o. female with a known history of bronchitis, DM, HLD presents to the emergency department for evaluation of one month of cough.  Patient was in a usual state of health until one month ago, she was mowing the lawn and developed a cough productive of green sputum  which she attributed to her allergies.  She was seen via telemedicine 2 days ago and treated for allergies. Symptoms have been refractory to Flonase, Albuterol, Tessalon.   Patient denies fevers/chills, dizziness, chest pain, shortness of breath, N/V/C/D, abdominal pain, dysuria/frequency, changes in mental status.    Otherwise there has been no change in status. Patient has been taking medication as prescribed and there has been no recent change in medication or diet.  No recent antibiotics.  There has been no recent illness, hospitalizations, travel or sick contacts.    EMS/ED Course: Patient received Rocephin and azithromycin. Medical admission has been requested for further management of COVID pneumonia.    Subjective:    Amanda Shepherd today has, No headache, No chest pain, No abdominal pain - No Nausea, No new weakness tingling or numbness, NoCough - SOB.    Assessment  & Plan :    Active Problems:   COVID-19 virus infection   1.  Acute Covid 19 Viral Illness during the ongoing 2020 Covid 19 Pandemic - minimal to no symptoms currently, monitor closely.  If  develops any symptoms or chest x-ray changes will add REMDESIVIR, encouraged to sit up in chair use flutter valve and monitor.  Inflammatory markers are borderline will monitor if they rise will add steroids.   COVID-19 Labs  Recent Labs    04/02/19 0017 04/02/19 0315  DDIMER 3.41* 3.53*  FERRITIN  --  516*  CRP  --  8.0*    Lab Results  Component Value Date   SARSCOV2NAA POSITIVE (A) 04/01/2019     Hepatic Function Latest Ref Rng & Units 04/01/2019 06/07/2017 03/06/2013  Total Protein 6.5 - 8.1 g/dL 7.8 7.6 7.8  Albumin 3.5 - 5.0 g/dL 3.4(L) 4.4 4.6  AST 15 - 41 U/L 45(H) 19 19  ALT 0 - 44 U/L _0 Alk Phosphatase 38 - 126 U/L 77 134(H) 107  Total Bilirubin 0.3 - 1.2 mg/dL 0.3 0.4 0.6     No results found for: BNP    2.  Hypertension.  Blood pressure soft hold all medications.  3.  Dehydration with AKI.  Hydrate with IV fluids.  4.  DM type II.  Placed on Lantus and sliding scale will monitor.  Poor outpatient control due to hyperglycemia.  Lab Results  Component Value Date   HGBA1C 8.1 (A) 03/25/2018   CBG (last 3)  Recent Labs    04/01/19 1624 04/01/19 2227 04/02/19 0723  GLUCAP 279* 200* 158*      Condition - Fair  Family Communication  :  None  Code Status :  Full  Diet :   Diet Order            Diet heart healthy/carb modified Room service appropriate? Yes; Fluid consistency: Thin  Diet effective now               Disposition Plan  :  Home  Consults  :  None  Procedures  :  None  PUD Prophylaxis : none  DVT Prophylaxis  :  Lovenox    Lab Results  Component Value Date   PLT 235 04/01/2019    Inpatient Medications  Scheduled Meds: . aspirin EC  81 mg Oral Daily  . benzonatate  100 mg Oral Q8H  . enoxaparin (LOVENOX) injection  30 mg Subcutaneous QHS  . insulin aspart  0-15 Units Subcutaneous TID WC  . insulin aspart  0-5 Units Subcutaneous QHS  . insulin glargine  15 Units Subcutaneous Daily  . ipratropium  2 puff  Inhalation Q6H  . loratadine  10 mg Oral Daily   Continuous Infusions: . cefTRIAXone (ROCEPHIN)  IV 1 g (04/02/19 0820)  . lactated ringers     PRN Meds:.acetaminophen, traMADol  Antibiotics  :    Anti-infectives (From admission, onward)   Start     Dose/Rate Route Frequency Ordered Stop   04/02/19 0800  cefTRIAXone (ROCEPHIN) 1 g in sodium chloride 0.9 % 100 mL IVPB     1 g 200 mL/hr over 30 Minutes Intravenous Every 24 hours 04/02/19 0726 04/05/19 0759   04/01/19 1900  cefTRIAXone (ROCEPHIN) 1 g in sodium chloride 0.9 % 100 mL IVPB     1 g 200 mL/hr over 30 Minutes Intravenous  Once 04/01/19 1859 04/01/19 2015   04/01/19 1900  azithromycin (ZITHROMAX) 500 mg in sodium chloride 0.9 % 250 mL IVPB     500 mg 250 mL/hr over 60 Minutes Intravenous  Once 04/01/19 1859 04/01/19 2215       Time Spent in minutes  30   Lala Lund M.D on 04/02/2019 at 9:54 AM  To page go to [email or web address] - password [facility]  [facility] -  Office  [phone]   See all Orders from today for further details    Objective:   Vitals:   04/02/19 0125 04/02/19 0306 04/02/19 0611 04/02/19 0917  BP: 138/83 115/79 115/71 102/75  Pulse: Marland Kitchen)  107 (!) 108 (!) 102 93  Resp: _0 Temp: 99.4 F (37.4 C) (!) 100.9 F (38.3 C) (!) 100.7 F (38.2 C) (!) 97.4 F (36.3 C)  TempSrc: Oral Oral Oral Oral  SpO2: 98% 99% 98% 97%  Weight:      Height:        Wt Readings from Last 3 Encounters:  04/01/19 61 kg  03/25/18 62.1 kg  01/25/18 65.6 kg     Intake/Output Summary (Last 24 hours) at 04/02/2019 0954 Last data filed at 04/02/2019 0948 Gross per 24 hour  Intake 590 ml  Output 1000 ml  Net -410 ml     Physical Exam  Awake Alert, Oriented X 3, No new F.N deficits, Normal affect [place].AT,PERRAL Supple Neck,No JVD, No cervical lymphadenopathy appriciated.  Symmetrical Chest wall movement, Good air movement bilaterally, CTAB RRR,No Gallops,Rubs or new Murmurs, No Parasternal Heave  +ve B.Sounds, Abd Soft, No tenderness, No organomegaly appriciated, No rebound - guarding or rigidity. No Cyanosis, Clubbing or edema, No new Rash or bruise     Data Review:    CBC Recent Labs  Lab 04/01/19 1339 04/01/19 2330  WBC 8.1 8.0  HGB 13.5 12.4  HCT 41.1 38.0  PLT 264 235  MCV 91.5 92.5  MCH 30.1 30.2  MCHC 32.8 32.6  RDW 11.6 11.5  LYMPHSABS 1.0  --   MONOABS 0.6  --   EOSABS 0.2  --   BASOSABS 0.0  --     Chemistries  Recent Labs  Lab 04/01/19 1339 04/02/19 0315  NA 135  --   K 4.1  --   CL 101  --   CO2 20*  --   GLUCOSE 404*  --   BUN 41*  --   CREATININE 1.81*  --   CALCIUM 8.6*  --   MG  --  2.3  AST 45*  --   ALT 25  --   ALKPHOS 77  --   BILITOT 0.3  --    ------------------------------------------------------------------------------------------------------------------ No results for input(s): CHOL, HDL, LDLCALC, TRIG, CHOLHDL, LDLDIRECT in the last 72 hours.  Lab Results  Component Value Date   HGBA1C 8.1 (A) 03/25/2018   ------------------------------------------------------------------------------------------------------------------ No results for input(s): TSH, T4TOTAL, T3FREE, THYROIDAB in the last 72 hours.  Invalid input(s): FREET3  Cardiac Enzymes Recent Labs  Lab 04/01/19 1610  TROPONINI 0.03*   ------------------------------------------------------------------------------------------------------------------ No results found for: BNP  Micro Results Recent Results (from the past 240 hour(s))  Blood culture (routine x 2)     Status: None (Preliminary result)   Collection Time: 04/01/19  2:09 PM   Specimen: BLOOD LEFT FOREARM  Result Value Ref Range Status   Specimen Description   Final    BLOOD LEFT FOREARM Performed at [facility], [place] [address], [place], [place] [address]    Special Requests   Final    BOTTLES DRAWN AEROBIC AND ANAEROBIC Blood Culture results may not be optimal due to an  inadequate volume of blood received in culture bottles Performed at [facility] [place] [address] [place], [place] [address]    Culture   Final    NO GROWTH < 12 HOURS Performed at [facility] [address] [place], [place] [address]    Report Status PENDING  Incomplete  SARS Coronavirus 2 (CEPHEID- Performed in [facility] hospital lab), Hosp Order     Status: Abnormal   Collection Time: 04/01/19  3:47 PM  Specimen: Nasopharyngeal Swab  Result Value Ref Range Status   SARS Coronavirus 2 POSITIVE (A) NEGATIVE Final    Comment: RESULT CALLED TO, READ BACK BY AND VERIFIED WITH: B.HINDERLY,PAC [id number] _0  BY V.WILKINS  (NOTE) If result is NEGATIVE SARS-CoV-2 target nucleic acids are NOT DETECTED. The SARS-CoV-2 RNA is generally detectable in upper and lower  respiratory specimens during the acute phase of infection. The lowest  concentration of SARS-CoV-2 viral copies this assay can detect is 250  copies / mL. A negative result does not preclude SARS-CoV-2 infection  and should not be used as the sole basis for treatment or other  patient management decisions.  A negative result may occur with  improper specimen collection / handling, submission of specimen other  than nasopharyngeal swab, presence of viral mutation(s) within the  areas targeted by this assay, and inadequate number of viral copies  (<250 copies / mL). A negative result must be combined with clinical  observations, patient history, and epidemiological information. If result is POSITIVE SARS-CoV-2 target nucleic acids are DETECT ED. The SARS-CoV-2 RNA is generally detectable in upper and lower  respiratory specimens during the acute phase of infection.  Positive  results are indicative of active infection with SARS-CoV-2.  Clinical  correlation with patient history and other diagnostic information is  necessary to determine patient infection status.  Positive results do  not rule out  bacterial infection or co-infection with other viruses. If result is PRESUMPTIVE POSTIVE SARS-CoV-2 nucleic acids MAY BE PRESENT.   A presumptive positive result was obtained on the submitted specimen  and confirmed on repeat testing.  While 2019 novel coronavirus  (SARS-CoV-2) nucleic acids may be present in the submitted sample  additional confirmatory testing may be necessary for epidemiological  and / or clinical management purposes  to differentiate between  SARS-CoV-2 and other Sarbecovirus currently known to infect humans.  If clinically indicated additional testing with an alternate test  methodology (LAB74 [id number]) is advised. The SARS-CoV-2 RNA is generally  detectable in upper and lower respiratory specimens during the acute  phase of infection. The expected result is Negative. Fact Sheet for Patients:  StrictlyIdeas.no Fact Sheet for Healthcare Providers: BankingDealers.co.za This test is not yet approved or cleared by the [place] FDA and has been authorized for detection and/or diagnosis of SARS-CoV-2 by FDA under an Emergency Use Authorization (EUA).  This EUA will remain in effect (meaning this test can be used) for the duration of the COVID-19 declaration under Section 564(b)(1) of the Act, 21 U.S.C. section 360bbb-3(b)(1), unless the authorization is terminated or revoked sooner. Performed at [facility], [place] [address] [place], [place] [address]   Blood culture (routine x 2)     Status: None (Preliminary result)   Collection Time: 04/01/19 11:30 PM   Specimen: BLOOD  Result Value Ref Range Status   Specimen Description   Final    BLOOD RIGHT ANTECUBITAL Performed at [facility] [address] [place], [place] [address]    Special Requests   Final    BOTTLES DRAWN AEROBIC ONLY Blood Culture adequate volume Performed at [facility] [address],  [place], [place] [address]    Culture   Final    NO GROWTH < 12 HOURS Performed at [facility] [address], [place], [place] [address]    Report Status PENDING  Incomplete    Radiology Reports Dg Chest 2 View  Result Date: 03/28/2019 CLINICAL DATA:  Per pt: Bad cough x 2 weeks. No injury. No Sob or wheezing. History of diabetes, chronic bronchitis, and allergies. Nonsmokercough x 2 weeks EXAM: CHEST - 2 VIEW COMPARISON:  None. FINDINGS: Normal mediastinum and cardiac silhouette. Normal pulmonary vasculature. No evidence of effusion, infiltrate, or pneumothorax. No acute bony abnormality. IMPRESSION: No acute cardiopulmonary process. Electronically Signed   By: Suzy Bouchard M.D.   On: 03/28/2019 12:11   Dg Chest Portable 1 View  Result Date: 04/01/2019 CLINICAL DATA:  68 y.o female reports for the past month feeling drained/fatigue with intermittent loose stools and decreased appetite. Reports been taking medications that her PCP and UC have prescribed her but still not feeling well. EXAM: PORTABLE CHEST 1 VIEW COMPARISON:  03/28/2019 FINDINGS: The heart size and mediastinal contours are within normal limits. Both lungs are clear. Thoracic spondylosis is noted. IMPRESSION: No active disease. Electronically Signed   By: Van Clines M.D.   On: 04/01/2019 14:12

## 2019-04-03 LAB — CBC WITH DIFFERENTIAL/PLATELET
Abs Immature Granulocytes: 0.05 10*3/uL (ref 0.00–0.07)
Basophils Absolute: 0 10*3/uL (ref 0.0–0.1)
Basophils Relative: 0 %
Eosinophils Absolute: 0 10*3/uL (ref 0.0–0.5)
Eosinophils Relative: 1 %
HCT: 37.5 % (ref 36.0–46.0)
Hemoglobin: 12.1 g/dL (ref 12.0–15.0)
Immature Granulocytes: 1 %
Lymphocytes Relative: 30 %
Lymphs Abs: 2.2 10*3/uL (ref 0.7–4.0)
MCH: 30 pg (ref 26.0–34.0)
MCHC: 32.3 g/dL (ref 30.0–36.0)
MCV: 92.8 fL (ref 80.0–100.0)
Monocytes Absolute: 0.6 10*3/uL (ref 0.1–1.0)
Monocytes Relative: 8 %
Neutro Abs: 4.4 10*3/uL (ref 1.7–7.7)
Neutrophils Relative %: 60 %
Platelets: 276 10*3/uL (ref 150–400)
RBC: 4.04 MIL/uL (ref 3.87–5.11)
RDW: 11.7 % (ref 11.5–15.5)
WBC: 7.2 10*3/uL (ref 4.0–10.5)
nRBC: 0 % (ref 0.0–0.2)

## 2019-04-03 LAB — GLUCOSE, CAPILLARY
Glucose-Capillary: 110 mg/dL — ABNORMAL HIGH (ref 70–99)
Glucose-Capillary: 137 mg/dL — ABNORMAL HIGH (ref 70–99)
Glucose-Capillary: 170 mg/dL — ABNORMAL HIGH (ref 70–99)
Glucose-Capillary: 81 mg/dL (ref 70–99)
Glucose-Capillary: 95 mg/dL (ref 70–99)

## 2019-04-03 LAB — COMPREHENSIVE METABOLIC PANEL
ALT: 25 U/L (ref 0–44)
AST: 67 U/L — ABNORMAL HIGH (ref 15–41)
Albumin: 2.3 g/dL — ABNORMAL LOW (ref 3.5–5.0)
Alkaline Phosphatase: 60 U/L (ref 38–126)
Anion gap: 7 (ref 5–15)
BUN: 21 mg/dL (ref 8–23)
CO2: 23 mmol/L (ref 22–32)
Calcium: 8.2 mg/dL — ABNORMAL LOW (ref 8.9–10.3)
Chloride: 112 mmol/L — ABNORMAL HIGH (ref 98–111)
Creatinine, Ser: 1.11 mg/dL — ABNORMAL HIGH (ref 0.44–1.00)
GFR calc Af Amer: 60 mL/min — ABNORMAL LOW (ref >60)
GFR calc non Af Amer: 52 mL/min — ABNORMAL LOW (ref >60)
Glucose, Bld: 111 mg/dL — ABNORMAL HIGH (ref 70–99)
Potassium: 3.9 mmol/L (ref 3.5–5.1)
Sodium: 142 mmol/L (ref 135–145)
Total Bilirubin: 0.4 mg/dL (ref 0.3–1.2)
Total Protein: 6.1 g/dL — ABNORMAL LOW (ref 6.5–8.1)

## 2019-04-03 LAB — C-REACTIVE PROTEIN: CRP: 5.3 mg/dL — ABNORMAL HIGH (ref <1.0)

## 2019-04-03 LAB — D-DIMER, QUANTITATIVE: D-Dimer, Quant: 3.73 ug/mL-FEU — ABNORMAL HIGH (ref 0.00–0.50)

## 2019-04-03 LAB — FERRITIN: Ferritin: 621 ng/mL — ABNORMAL HIGH (ref 11–307)

## 2019-04-03 LAB — MAGNESIUM: Magnesium: 2.1 mg/dL (ref 1.7–2.4)

## 2019-04-03 LAB — PROCALCITONIN: Procalcitonin: 0.24 ng/mL

## 2019-04-03 LAB — CBG MONITORING, ED: Glucose-Capillary: 306 mg/dL — ABNORMAL HIGH (ref 70–99)

## 2019-04-03 LAB — BRAIN NATRIURETIC PEPTIDE: B Natriuretic Peptide: 48.9 pg/mL (ref 0.0–100.0)

## 2019-04-03 MED ORDER — IPRATROPIUM BROMIDE HFA 17 MCG/ACT IN AERS: 2.0000 | INHALATION_SPRAY | Freq: Four times a day (QID) | RESPIRATORY_TRACT | Status: DC | PRN

## 2019-04-03 MED ORDER — GUAIFENESIN-DM 100-10 MG/5ML PO SYRP
10.0000 mL | ORAL_SOLUTION | ORAL | Status: DC | PRN
  Administered 2019-04-03: 10 mL via ORAL
  Filled 2019-04-03: qty 10

## 2019-04-03 NOTE — Progress Notes (Signed)
Pt complaining of wanting a "candy bar", says she misses being able to eat what she wants. Had a long conversation with her this morning about blood sugar's role in health and healing. Seemed disinterested - kept looking out the window, no questions, changing subject. Dinner arrived right after our conversation about her wanting a candy bar so she was happy.

## 2019-04-03 NOTE — Progress Notes (Signed)
PROGRESS NOTE                                                                                                                                                                                                             Patient Demographics:    Amanda Shepherd, is a 68 y.o. female, DOB - December 28, 1950, HTD:[id number]  Outpatient Primary MD for the patient is Brocton Bing, DO    LOS - 2  Admit date - 04/01/2019    Chief Complaint  Patient presents with  . Fatigue       Brief Narrative  Amanda Shepherd is a 68 y.o. female with a known history of bronchitis, DM, HLD presents to the emergency department for evaluation of one month of cough.  Patient was in a usual state of health until one month ago, she was mowing the lawn and developed a cough productive of green sputum  which she attributed to her allergies.  She was seen via telemedicine 2 days ago and treated for allergies. Symptoms have been refractory to Flonase, Albuterol, Tessalon.   Patient denies fevers/chills, dizziness, chest pain, shortness of breath, N/V/C/D, abdominal pain, dysuria/frequency, changes in mental status.    Otherwise there has been no change in status. Patient has been taking medication as prescribed and there has been no recent change in medication or diet.  No recent antibiotics.  There has been no recent illness, hospitalizations, travel or sick contacts.    EMS/ED Course: Patient received Rocephin and azithromycin. Medical admission has been requested for further management of COVID pneumonia.    Subjective:   Patient in bed, appears comfortable, denies any headache, no fever, no chest pain or pressure, no shortness of breath , no abdominal pain. No focal weakness.   Assessment  & Plan :      1.  Acute Covid 19 Viral Illness during the ongoing 2020 Covid 19 Pandemic - minimal to no symptoms currently, monitor closely.  If develops any symptoms or  chest x-ray changes will add REMDESIVIR, encouraged to sit up in chair use flutter valve and monitor.  Inflammatory markers were mildly elevated hence she was started on steroids with good effect, continues to improve, stable on room air currently, encourage increasing activity and sitting up in chair, PT eval.   COVID-19 Labs  Recent Labs    04/02/19  0315 04/02/19 0945 04/03/19 0528  DDIMER 3.53* 3.57* 3.73*  FERRITIN 516*  --  621*  CRP 8.0* 8.9* 5.3*    Lab Results  Component Value Date   SARSCOV2NAA POSITIVE (A) 04/01/2019     Hepatic Function Latest Ref Rng & Units 04/03/2019 04/02/2019 04/01/2019  Total Protein 6.5 - 8.1 g/dL 6.1(L) 6.7 7.8  Albumin 3.5 - 5.0 g/dL 2.3(L) 2.6(L) 3.4(L)  AST 15 - 41 U/L 67(H) 65(H) 45(H)  ALT 0 - 44 U/L 25 26 25   Alk Phosphatase 38 - 126 U/L 60 65 77  Total Bilirubin 0.3 - 1.2 mg/dL 0.4 0.4 0.3        Component Value Date/Time   BNP 48.9 04/03/2019 0528      2.  Hypertension.  Blood pressure soft hold all medications.  3.  Dehydration with AKI.  Resolved after aggressive IV fluid hydration.  4.  1 out of 2 blood cultures drawn in the ER positive for coccobacilli gram-negative.  Could be contaminant, for now cover with IV Rocephin and monitor clinically.  Procalcitonin was negative hence more likely than not this is a contaminant.  Will check with ID as well.   5.  DM type II.  Placed on Lantus and sliding scale will monitor.  Poor outpatient control due to hyperglycemia.  Lab Results  Component Value Date   HGBA1C 10.2 (H) 04/02/2019   CBG (last 3)  Recent Labs    04/02/19 1216 04/02/19 1552 04/03/19 0728  GLUCAP 198* 121* 137*      Condition - Fair  Family Communication  :  None  Code Status :  Full  Diet :   Diet Order            Diet heart healthy/carb modified Room service appropriate? Yes; Fluid consistency: Thin  Diet effective now               Disposition Plan  :  Home  Consults  :  None   Procedures  :  None  PUD Prophylaxis : none  DVT Prophylaxis  :  Lovenox    Lab Results  Component Value Date   PLT 276 04/03/2019    Inpatient Medications  Scheduled Meds: . aspirin EC  81 mg Oral Daily  . benzonatate  100 mg Oral Q8H  . doxycycline  100 mg Oral Q12H  . enoxaparin (LOVENOX) injection  40 mg Subcutaneous QHS  . insulin aspart  0-15 Units Subcutaneous TID WC  . insulin glargine  15 Units Subcutaneous Daily  . loratadine  10 mg Oral Daily   Continuous Infusions: . cefTRIAXone (ROCEPHIN)  IV     PRN Meds:.acetaminophen, ipratropium, traMADol  Antibiotics  :    Anti-infectives (From admission, onward)   Start     Dose/Rate Route Frequency Ordered Stop   04/03/19 0800  cefTRIAXone (ROCEPHIN) 2 g in sodium chloride 0.9 % 100 mL IVPB     2 g 200 mL/hr over 30 Minutes Intravenous Every 24 hours 04/02/19 1734 04/05/19 0759   04/02/19 1745  cefTRIAXone (ROCEPHIN) 1 g in sodium chloride 0.9 % 100 mL IVPB     1 g 200 mL/hr over 30 Minutes Intravenous  Once 04/02/19 1734 04/02/19 1906   04/02/19 1000  doxycycline (VIBRA-TABS) tablet 100 mg     100 mg Oral Every 12 hours 04/02/19 0958     04/02/19 0800  cefTRIAXone (ROCEPHIN) 1 g in sodium chloride 0.9 % 100 mL IVPB  Status:  Discontinued  1 g 200 mL/hr over 30 Minutes Intravenous Every 24 hours 04/02/19 0726 04/02/19 1734   04/01/19 1900  cefTRIAXone (ROCEPHIN) 1 g in sodium chloride 0.9 % 100 mL IVPB     1 g 200 mL/hr over 30 Minutes Intravenous  Once 04/01/19 1859 04/01/19 2015   04/01/19 1900  azithromycin (ZITHROMAX) 500 mg in sodium chloride 0.9 % 250 mL IVPB     500 mg 250 mL/hr over 60 Minutes Intravenous  Once 04/01/19 1859 04/01/19 2215       Time Spent in minutes  30   Lala Lund M.D on 04/03/2019 at 9:29 AM  To page go to [email or web address] - password [facility]  [facility] -  Office  [phone]   See all Orders from today for further details    Objective:   Vitals:   04/02/19  1555 04/02/19 2042 04/03/19 0540 04/03/19 0731  BP: 101/63 101/67 115/72 115/70  Pulse: 100 96 86 93  Resp: 15 16 18 19   Temp: 100.1 F (37.8 C) 100.3 F (37.9 C) 98.5 F (36.9 C) 98.3 F (36.8 C)  TempSrc: Oral Oral Oral Oral  SpO2: 96% 97% 96% 95%  Weight:      Height:        Wt Readings from Last 3 Encounters:  04/01/19 61 kg  03/25/18 62.1 kg  01/25/18 65.6 kg     Intake/Output Summary (Last 24 hours) at 04/03/2019 0929 Last data filed at 04/03/2019 0500 Gross per 24 hour  Intake 480 ml  Output 950 ml  Net -470 ml     Physical Exam  Awake Alert, Oriented X 3, No new F.N deficits, Normal affect [place].AT,PERRAL Supple Neck,No JVD, No cervical lymphadenopathy appriciated.  Symmetrical Chest wall movement, Good air movement bilaterally, CTAB RRR,No Gallops, Rubs or new Murmurs, No Parasternal Heave +ve B.Sounds, Abd Soft, No tenderness, No organomegaly appriciated, No rebound - guarding or rigidity. No Cyanosis, Clubbing or edema, No new Rash or bruise   Data Review:    CBC Recent Labs  Lab 04/01/19 1339 04/01/19 2330 04/03/19 0528  WBC 8.1 8.0 7.2  HGB 13.5 12.4 12.1  HCT 41.1 38.0 37.5  PLT 264 235 276  MCV 91.5 92.5 92.8  MCH 30.1 30.2 30.0  MCHC 32.8 32.6 32.3  RDW 11.6 11.5 11.7  LYMPHSABS 1.0  --  2.2  MONOABS 0.6  --  0.6  EOSABS 0.2  --  0.0  BASOSABS 0.0  --  0.0    Chemistries  Recent Labs  Lab 04/01/19 1339 04/02/19 0315 04/02/19 0945 04/03/19 0528  NA 135  --  140 142  K 4.1  --  3.8 3.9  CL 101  --  107 112*  CO2 20*  --  22 23  GLUCOSE 404*  --  210* 111*  BUN 41*  --  22 21  CREATININE 1.81*  --  1.15* 1.11*  CALCIUM 8.6*  --  8.2* 8.2*  MG  --  2.3  --  2.1  AST 45*  --  65* 67*  ALT 25  --  26 25  ALKPHOS 77  --  65 60  BILITOT 0.3  --  0.4 0.4   ------------------------------------------------------------------------------------------------------------------ No results for input(s): CHOL, HDL, LDLCALC, TRIG, CHOLHDL,  LDLDIRECT in the last 72 hours.  Lab Results  Component Value Date   HGBA1C 10.2 (H) 04/02/2019   ------------------------------------------------------------------------------------------------------------------ No results for input(s): TSH, T4TOTAL, T3FREE, THYROIDAB in the last 72 hours.  Invalid input(s): FREET3  Cardiac  Enzymes Recent Labs  Lab 04/01/19 1610  TROPONINI 0.03*   ------------------------------------------------------------------------------------------------------------------    Component Value Date/Time   BNP 48.9 04/03/2019 0528    Micro Results Recent Results (from the past 240 hour(s))  Blood culture (routine x 2)     Status: None (Preliminary result)   Collection Time: 04/01/19  2:09 PM   Specimen: BLOOD LEFT FOREARM  Result Value Ref Range Status   Specimen Description   Final    BLOOD LEFT FOREARM Performed at [facility], [place] [address], [place], [place] [address]    Special Requests   Final    BOTTLES DRAWN AEROBIC AND ANAEROBIC Blood Culture results may not be optimal due to an inadequate volume of blood received in culture bottles Performed at [place] [address], [place], [place] [address]    Culture  Setup Time   Final    AEROBIC BOTTLE ONLY GRAM NEGATIVE COCCOBACILLI CRITICAL RESULT CALLED TO, READ BACK BY AND VERIFIED WITH: PHARMD SCOTT CHRISTIE [id number] FCP    Culture   Final    CULTURE REINCUBATED FOR BETTER GROWTH Performed at [facility], [place] [address] [place], [place] [address]    Report Status PENDING  Incomplete  Blood Culture ID Panel (Reflexed)     Status: None   Collection Time: 04/01/19  2:09 PM  Result Value Ref Range Status   Enterococcus species NOT DETECTED NOT DETECTED Final   Listeria monocytogenes NOT DETECTED NOT DETECTED Final   Staphylococcus species NOT DETECTED NOT DETECTED Final   Staphylococcus aureus (BCID) NOT DETECTED NOT DETECTED Final    Streptococcus species NOT DETECTED NOT DETECTED Final   Streptococcus agalactiae NOT DETECTED NOT DETECTED Final   Streptococcus pneumoniae NOT DETECTED NOT DETECTED Final   Streptococcus pyogenes NOT DETECTED NOT DETECTED Final   Acinetobacter baumannii NOT DETECTED NOT DETECTED Final   Enterobacteriaceae species NOT DETECTED NOT DETECTED Final   Enterobacter cloacae complex NOT DETECTED NOT DETECTED Final   Escherichia coli NOT DETECTED NOT DETECTED Final   Klebsiella oxytoca NOT DETECTED NOT DETECTED Final   Klebsiella pneumoniae NOT DETECTED NOT DETECTED Final   Proteus species NOT DETECTED NOT DETECTED Final   Serratia marcescens NOT DETECTED NOT DETECTED Final   Haemophilus influenzae NOT DETECTED NOT DETECTED Final   Neisseria meningitidis NOT DETECTED NOT DETECTED Final   Pseudomonas aeruginosa NOT DETECTED NOT DETECTED Final   Candida albicans NOT DETECTED NOT DETECTED Final   Candida glabrata NOT DETECTED NOT DETECTED Final   Candida krusei NOT DETECTED NOT DETECTED Final   Candida parapsilosis NOT DETECTED NOT DETECTED Final   Candida tropicalis NOT DETECTED NOT DETECTED Final    Comment: Performed at [facility], [place] [address], [place], [place] [address]  SARS Coronavirus 2 (CEPHEID- Performed in [place] hospital lab), Hosp Order     Status: Abnormal   Collection Time: 04/01/19  3:47 PM   Specimen: Nasopharyngeal Swab  Result Value Ref Range Status   SARS Coronavirus 2 POSITIVE (A) NEGATIVE Final    Comment: RESULT CALLED TO, READ BACK BY AND VERIFIED WITH: B.HINDERLY,PAC [id number] @0657  BY V.WILKINS  (NOTE) If result is NEGATIVE SARS-CoV-2 target nucleic acids are NOT DETECTED. The SARS-CoV-2 RNA is generally detectable in upper and lower  respiratory specimens during the acute phase of infection. The lowest  concentration of SARS-CoV-2 viral copies this assay can detect is 250  copies / mL. A negative result does not preclude SARS-CoV-2 infection  and should  not be used as the sole basis for treatment or other  patient management decisions.  A negative result may occur with  improper specimen collection / handling, submission of specimen other  than nasopharyngeal swab, presence of viral mutation(s) within the  areas targeted by this assay, and inadequate number of viral copies  (<250 copies / mL). A negative result must be combined with clinical  observations, patient history, and epidemiological information. If result is POSITIVE SARS-CoV-2 target nucleic acids are DETECT ED. The SARS-CoV-2 RNA is generally detectable in upper and lower  respiratory specimens during the acute phase of infection.  Positive  results are indicative of active infection with SARS-CoV-2.  Clinical  correlation with patient history and other diagnostic information is  necessary to determine patient infection status.  Positive results do  not rule out bacterial infection or co-infection with other viruses. If result is PRESUMPTIVE POSTIVE SARS-CoV-2 nucleic acids MAY BE PRESENT.   A presumptive positive result was obtained on the submitted specimen  and confirmed on repeat testing.  While 2019 novel coronavirus  (SARS-CoV-2) nucleic acids may be present in the submitted sample  additional confirmatory testing may be necessary for epidemiological  and / or clinical management purposes  to differentiate between  SARS-CoV-2 and other Sarbecovirus currently known to infect humans.  If clinically indicated additional testing with an alternate test  methodology (LAB74 53) is advised. The SARS-CoV-2 RNA is generally  detectable in upper and lower respiratory specimens during the acute  phase of infection. The expected result is Negative. Fact Sheet for Patients:  StrictlyIdeas.no Fact Sheet for Healthcare Providers: BankingDealers.co.za This test is not yet approved or cleared by the [place] FDA and has been  authorized for detection and/or diagnosis of SARS-CoV-2 by FDA under an Emergency Use Authorization (EUA).  This EUA will remain in effect (meaning this test can be used) for the duration of the COVID-19 declaration under Section 564(b)(1) of the Act, 21 U.S.C. section 360bbb-3(b)(1), unless the authorization is terminated or revoked sooner. Performed at [facility], [place] [address], [place], [place] [address]   Blood culture (routine x 2)     Status: None (Preliminary result)   Collection Time: 04/01/19 11:30 PM   Specimen: BLOOD  Result Value Ref Range Status   Specimen Description   Final    BLOOD RIGHT ANTECUBITAL Performed at [place] [address] [place], [place] [address]    Special Requests   Final    BOTTLES DRAWN AEROBIC ONLY Blood Culture adequate volume Performed at [place] [address], [place],  [address]    Culture   Final    NO GROWTH < 24 HOURS Performed at [place] [address], [place],  [address]    Report Status PENDING  Incomplete  Respiratory Panel by PCR     Status: None   Collection Time: 04/02/19  1:42 AM   Specimen: Nasopharyngeal Swab; Respiratory  Result Value Ref Range Status   Adenovirus NOT DETECTED NOT DETECTED Final   Coronavirus 229E NOT DETECTED NOT DETECTED Final    Comment: (NOTE) The Coronavirus on the Respiratory Panel, DOES NOT test for the novel  Coronavirus (2019 nCoV)    Coronavirus HKU1 NOT DETECTED NOT DETECTED Final   Coronavirus NL63 NOT DETECTED NOT DETECTED Final   Coronavirus OC43 NOT DETECTED NOT DETECTED Final   Metapneumovirus NOT DETECTED NOT DETECTED Final   Rhinovirus / Enterovirus NOT  DETECTED NOT DETECTED Final   Influenza A NOT DETECTED NOT DETECTED Final   Influenza B NOT DETECTED NOT DETECTED Final   Parainfluenza Virus 1 NOT DETECTED NOT DETECTED Final   Parainfluenza Virus 2 NOT DETECTED NOT DETECTED Final    Parainfluenza Virus 3 NOT DETECTED NOT DETECTED Final   Parainfluenza Virus 4 NOT DETECTED NOT DETECTED Final   Respiratory Syncytial Virus NOT DETECTED NOT DETECTED Final   Bordetella pertussis NOT DETECTED NOT DETECTED Final   Chlamydophila pneumoniae NOT DETECTED NOT DETECTED Final   Mycoplasma pneumoniae NOT DETECTED NOT DETECTED Final    Comment: Performed at [facility], [place] [address] [place], [place] [address]    Radiology Reports Dg Chest 2 View  Result Date: 03/28/2019 CLINICAL DATA:  Per pt: Bad cough x 2 weeks. No injury. No Sob or wheezing. History of diabetes, chronic bronchitis, and allergies. Nonsmokercough x 2 weeks EXAM: CHEST - 2 VIEW COMPARISON:  None. FINDINGS: Normal mediastinum and cardiac silhouette. Normal pulmonary vasculature. No evidence of effusion, infiltrate, or pneumothorax. No acute bony abnormality. IMPRESSION: No acute cardiopulmonary process. Electronically Signed   By: Suzy Bouchard M.D.   On: 03/28/2019 12:11   Dg Chest Portable 1 View  Result Date: 04/01/2019 CLINICAL DATA:  68 y.o female reports for the past month feeling drained/fatigue with intermittent loose stools and decreased appetite. Reports been taking medications that her PCP and UC have prescribed her but still not feeling well. EXAM: PORTABLE CHEST 1 VIEW COMPARISON:  03/28/2019 FINDINGS: The heart size and mediastinal contours are within normal limits. Both lungs are clear. Thoracic spondylosis is noted. IMPRESSION: No active disease. Electronically Signed   By: Van Clines M.D.   On: 04/01/2019 14:12

## 2019-04-03 NOTE — Progress Notes (Signed)
Inpatient Diabetes Program Recommendations  AACE/ADA: New Consensus Statement on Inpatient Glycemic Control (2015)  Target Ranges:  Prepandial:   less than 140 mg/dL      Peak postprandial:   less than 180 mg/dL (1-2 hours)      Critically ill patients:  140 - 180 mg/dL   Lab Results  Component Value Date   GLUCAP 170 (H) 04/03/2019   HGBA1C 10.2 (H) 04/02/2019    Review of Glycemic Control  Diabetes history: DM2 Outpatient Diabetes medications: metformin 1000 mg bid, Farxiga 10 mg QD Current orders for Inpatient glycemic control: Lantus 15 units QD, Novolog 0-15 units   HgbA1C - 10.2%  Blood sugars today - 137, 170 PCP - [facility] Attempted to reach pt by phone to speak about glycemic control at home and HgbA1C of 10.2%. Per EMR, pt sees MD at [facility]. Last appt in March 2020. Has not been monitoring blood sugars on regular basis at home. (May be good candidate for [facility] CGM)  Inpatient Diabetes Program Recommendations:     Will attempt to call pt again this afternoon regarding her diabetes control at home. Since she is being followed on a regular basis with her PCP, would not recommend starting insulin at discharge. Pt had missed a couple weeks of Farxiga prior to admission.   Continue to follow.  Thank you. Lorenda Peck, RD, LDN, CDE Inpatient Diabetes Coordinator [phone]

## 2019-04-03 NOTE — Evaluation (Signed)
Physical Therapy Evaluation Patient Details Name: Amanda Shepherd MRN: [id number] DOB: [birth year] Today's Date: 04/03/2019   History of Present Illness  Amanda Shepherd is a 68 y.o. female with a known history of bronchitis, DM, HLD presents to the emergency department for evaluation of one month of cough.  Patient to ED with increased SOB. Positive for covid -19.  Clinical Impression   The patient required no Oxygen for activity, ambulated x 800' on RA with SaO2 at 95%. Encouraged patient to ask for assist to ambulate in hallway. Pt admitted with above diagnosis. Pt currently with functional limitations due to the deficits listed below (see PT Problem List).  Pt will benefit from skilled PT to increase their independence and safety with mobility to allow discharge to the venue listed below.        Follow Up Recommendations No PT follow up    Equipment Recommendations  None recommended by PT    Recommendations for Other Services       Precautions / Restrictions Precautions Precautions: None      Mobility  Bed Mobility               General bed mobility comments: oob  Transfers Overall transfer level: Independent Equipment used: None                Ambulation/Gait Ambulation/Gait assistance: Independent Gait Distance (Feet): 800 Feet Assistive device: None Gait Pattern/deviations: WFL(Within Functional Limits)     General Gait Details: able to " dance" around,  Stairs            Wheelchair Mobility    Modified Rankin (Stroke Patients Only)       Balance                                             Pertinent Vitals/Pain Pain Assessment: No/denies pain    Home Living Family/patient expects to be discharged to:: Private residence Living Arrangements: Other relatives Available Help at Discharge: Family Type of Home: House Home Access: Level entry     Home Layout: Two level;Able to live on main level with  bedroom/bathroom Home Equipment: None      Prior Function Level of Independence: Independent               Hand Dominance        Extremity/Trunk Assessment   Upper Extremity Assessment Upper Extremity Assessment: Overall WFL for tasks assessed    Lower Extremity Assessment Lower Extremity Assessment: Overall WFL for tasks assessed    Cervical / Trunk Assessment Cervical / Trunk Assessment: Normal  Communication   Communication: No difficulties  Cognition Arousal/Alertness: Awake/alert Behavior During Therapy: WFL for tasks assessed/performed Overall Cognitive Status: Within Functional Limits for tasks assessed                                        General Comments      Exercises     Assessment/Plan    PT Assessment Patient needs continued PT services  PT Problem List Decreased activity tolerance       PT Treatment Interventions Gait training    PT Goals (Current goals can be found in the Care Plan section)  Acute Rehab PT Goals Patient Stated Goal: to get back to being independent PT  Goal Formulation: With patient Time For Goal Achievement: 04/10/19 Potential to Achieve Goals: Good    Frequency Min 2X/week   Barriers to discharge        Co-evaluation               AM-PAC PT "6 Clicks" Mobility  Outcome Measure Help needed turning from your back to your side while in a flat bed without using bedrails?: None Help needed moving from lying on your back to sitting on the side of a flat bed without using bedrails?: None Help needed moving to and from a bed to a chair (including a wheelchair)?: None Help needed standing up from a chair using your arms (e.g., wheelchair or bedside chair)?: None Help needed to walk in hospital room?: None Help needed climbing 3-5 steps with a railing? : None 6 Click Score: 24    End of Session   Activity Tolerance: Patient tolerated treatment well Patient left: in chair;with call bell/phone  within reach;with nursing/sitter in room Nurse Communication: Mobility status PT Visit Diagnosis: Other abnormalities of gait and mobility (R26.89)    Time: 7425-9563 PT Time Calculation (min) (ACUTE ONLY): 26 min   Charges:   PT Evaluation $PT Eval Low Complexity: 1 Low PT Treatments $Gait Training: 8-22 mins        [place] Pager [phone] Office [phone]   Claretha Cooper 04/03/2019, 10:41 AM

## 2019-04-03 NOTE — Progress Notes (Signed)
Initial Nutrition Assessment  DOCUMENTATION CODES:   Not applicable  INTERVENTION:   Staff to encourage PO intake  Snacks BID  Provide DM education as appropriate, recommend outpatient DM referral prior to discharge.   Pt receiving Hormel Shake daily with Breakfast which provides 520 kcals and 22 g of protein and Magic cup BID with lunch and dinner, each supplement provides 290 kcal and 9 grams of protein, automatically on meal trays to optimize nutritional intake.    NUTRITION DIAGNOSIS:   Increased nutrient needs related to acute illness(COVID) as evidenced by estimated needs.  GOAL:   Patient will meet greater than or equal to 90% of their needs  MONITOR:   PO intake  REASON FOR ASSESSMENT:   Malnutrition Screening Tool    ASSESSMENT:   Pt with PMH of DM and HLD admitted for prolonged cough now positive for COVID-19 PNA.   Pt positive for weight loss and decreased appetite on admission. Noted per hx pt with > 1 month prolonged cough. Also noted poor control of DM with elevated hemoglobin A1C.  Unable to reach patient despite multiple attempts.   Meal Completion: 0-50%  Medications reviewed and include: SSI, 15 units daily Labs reviewed: Ferritin: 621 (H) Lab Results  Component Value Date   HGBA1C 10.2 (H) 04/02/2019     NUTRITION - FOCUSED PHYSICAL EXAM:  Deferred   Diet Order:   Diet Order            Diet heart healthy/carb modified Room service appropriate? Yes; Fluid consistency: Thin  Diet effective now              EDUCATION NEEDS:   Not appropriate for education at this time  Skin:  Skin Assessment: Reviewed RN Assessment  Last BM:  6/12  Height:   Ht Readings from Last 1 Encounters:  04/01/19 5\' 6"  (1.676 m)    Weight:   Wt Readings from Last 1 Encounters:  04/01/19 61 kg    Ideal Body Weight:  59 kg  BMI:  Body mass index is 21.71 kg/m.  Estimated Nutritional Needs:   Kcal:  1600-1800  Protein:  75-90  grams  Fluid:  > 1.6 L/day  Maylon Peppers RD, LDN, CNSC [phone] Pager [phone] After Hours Pager

## 2019-04-03 NOTE — Plan of Care (Signed)
  Problem: Education: Goal: Knowledge of General Education information will improve Description: Including pain rating scale, medication(s)/side effects and non-pharmacologic comfort measures Outcome: Progressing  Continue to education usage of incentive spirometry Problem: Activity: Goal: Risk for activity intolerance will decrease Outcome: Progressing   Problem: Coping: Goal: Level of anxiety will decrease Outcome: Progressing   Problem: Safety: Goal: Ability to remain free from injury will improve Outcome: Progressing   Problem: Skin Integrity: Goal: Risk for impaired skin integrity will decrease Outcome: Progressing

## 2019-04-04 LAB — COMPREHENSIVE METABOLIC PANEL
ALT: 29 U/L (ref 0–44)
AST: 74 U/L — ABNORMAL HIGH (ref 15–41)
Albumin: 2.3 g/dL — ABNORMAL LOW (ref 3.5–5.0)
Alkaline Phosphatase: 61 U/L (ref 38–126)
Anion gap: 13 (ref 5–15)
BUN: 19 mg/dL (ref 8–23)
CO2: 22 mmol/L (ref 22–32)
Calcium: 8.1 mg/dL — ABNORMAL LOW (ref 8.9–10.3)
Chloride: 107 mmol/L (ref 98–111)
Creatinine, Ser: 1.2 mg/dL — ABNORMAL HIGH (ref 0.44–1.00)
GFR calc Af Amer: 55 mL/min — ABNORMAL LOW (ref >60)
GFR calc non Af Amer: 47 mL/min — ABNORMAL LOW (ref >60)
Glucose, Bld: 99 mg/dL (ref 70–99)
Potassium: 3.6 mmol/L (ref 3.5–5.1)
Sodium: 142 mmol/L (ref 135–145)
Total Bilirubin: 0.8 mg/dL (ref 0.3–1.2)
Total Protein: 5.9 g/dL — ABNORMAL LOW (ref 6.5–8.1)

## 2019-04-04 LAB — C-REACTIVE PROTEIN: CRP: 3.8 mg/dL — ABNORMAL HIGH (ref <1.0)

## 2019-04-04 LAB — CBC WITH DIFFERENTIAL/PLATELET
Abs Immature Granulocytes: 0.07 10*3/uL (ref 0.00–0.07)
Basophils Absolute: 0 10*3/uL (ref 0.0–0.1)
Basophils Relative: 0 %
Eosinophils Absolute: 0.3 10*3/uL (ref 0.0–0.5)
Eosinophils Relative: 3 %
HCT: 36.1 % (ref 36.0–46.0)
Hemoglobin: 11.5 g/dL — ABNORMAL LOW (ref 12.0–15.0)
Immature Granulocytes: 1 %
Lymphocytes Relative: 21 %
Lymphs Abs: 1.9 10*3/uL (ref 0.7–4.0)
MCH: 29.5 pg (ref 26.0–34.0)
MCHC: 31.9 g/dL (ref 30.0–36.0)
MCV: 92.6 fL (ref 80.0–100.0)
Monocytes Absolute: 0.6 10*3/uL (ref 0.1–1.0)
Monocytes Relative: 7 %
Neutro Abs: 6 10*3/uL (ref 1.7–7.7)
Neutrophils Relative %: 68 %
Platelets: 314 10*3/uL (ref 150–400)
RBC: 3.9 MIL/uL (ref 3.87–5.11)
RDW: 11.7 % (ref 11.5–15.5)
WBC: 8.8 10*3/uL (ref 4.0–10.5)
nRBC: 0 % (ref 0.0–0.2)

## 2019-04-04 LAB — FERRITIN: Ferritin: 630 ng/mL — ABNORMAL HIGH (ref 11–307)

## 2019-04-04 LAB — GLUCOSE, CAPILLARY
Glucose-Capillary: 110 mg/dL — ABNORMAL HIGH (ref 70–99)
Glucose-Capillary: 130 mg/dL — ABNORMAL HIGH (ref 70–99)

## 2019-04-04 LAB — PROCALCITONIN: Procalcitonin: 0.18 ng/mL

## 2019-04-04 LAB — D-DIMER, QUANTITATIVE: D-Dimer, Quant: 2.9 ug/mL-FEU — ABNORMAL HIGH (ref 0.00–0.50)

## 2019-04-04 LAB — BRAIN NATRIURETIC PEPTIDE: B Natriuretic Peptide: 26.5 pg/mL (ref 0.0–100.0)

## 2019-04-04 LAB — MAGNESIUM: Magnesium: 2 mg/dL (ref 1.7–2.4)

## 2019-04-04 MED ORDER — BLOOD GLUCOSE METER KIT: PACK | 0 refills | Status: DC

## 2019-04-04 MED ORDER — INSULIN ASPART 100 UNIT/ML ~~LOC~~ SOLN: SUBCUTANEOUS | 0 refills | Status: DC

## 2019-04-04 MED ORDER — PREDNISONE 5 MG PO TABS: ORAL_TABLET | ORAL | 0 refills | Status: DC

## 2019-04-04 MED ORDER — LISINOPRIL 10 MG PO TABS: 10.0000 mg | ORAL_TABLET | Freq: Every day | ORAL | 3 refills | Status: DC

## 2019-04-04 MED ORDER — "INSULIN SYRINGE-NEEDLE U-100 25G X 1"" 1 ML MISC": 0 refills | Status: DC

## 2019-04-04 MED ORDER — LEVOFLOXACIN 500 MG PO TABS: 500.0000 mg | ORAL_TABLET | Freq: Every day | ORAL | 0 refills | Status: AC

## 2019-04-04 NOTE — Discharge Instructions (Signed)
Follow with Primary MD [place] Bing, DO in 7 days   Get CBC, CMP, 2 view Chest X ray -  checked next visit within 1 week by Primary MD   Activity: As tolerated with Full fall precautions use walker/cane & assistance as needed  Disposition Home   Diet: Heart Healthy  Low Carb  Accuchecks 4 times/day, Once in AM empty stomach and then before each meal. Log in all results and show them to your Prim.MD in 3 days. If any glucose reading is under 80 or above 300 call your Prim MD immidiately. Follow Low glucose instructions for glucose under 80 as instructed.   Special Instructions: If you have smoked or chewed Tobacco  in the last 2 yrs please stop smoking, stop any regular Alcohol  and or any Recreational drug use.  On your next visit with your primary care physician please Get Medicines reviewed and adjusted.  Please request your Prim.MD to go over all Hospital Tests and Procedure/Radiological results at the follow up, please get all Hospital records sent to your Prim MD by signing hospital release before you go home.  If you experience worsening of your admission symptoms, develop shortness of breath, life threatening emergency, suicidal or homicidal thoughts you must seek medical attention immediately by calling 911 or calling your MD immediately  if symptoms less severe.  You Must read complete instructions/literature along with all the possible adverse reactions/side effects for all the Medicines you take and that have been prescribed to you. Take any new Medicines after you have completely understood and accpet all the possible adverse reactions/side effects.        Person Under Monitoring Name: Amanda Shepherd  Location: [place]  [id number]   Infection Prevention Recommendations for Individuals Confirmed to have, or Being Evaluated for, 2019 Novel Coronavirus (COVID-19) Infection Who Receive Care at Home  Individuals who are confirmed to have, or are  being evaluated for, COVID-19 should follow the prevention steps below until a healthcare provider or local or state health department says they can return to normal activities.  Stay home except to get medical care You should restrict activities outside your home, except for getting medical care. Do not go to work, school, or public areas, and do not use public transportation or taxis.  Call ahead before visiting your doctor Before your medical appointment, call the healthcare provider and tell them that you have, or are being evaluated for, COVID-19 infection. This will help the healthcare providers office take steps to keep other people from getting infected. Ask your healthcare provider to call the local or state health department.  Monitor your symptoms Seek prompt medical attention if your illness is worsening (e.g., difficulty breathing). Before going to your medical appointment, call the healthcare provider and tell them that you have, or are being evaluated for, COVID-19 infection. Ask your healthcare provider to call the local or state health department.  Wear a facemask You should wear a facemask that covers your nose and mouth when you are in the same room with other people and when you visit a healthcare provider. People who live with or visit you should also wear a facemask while they are in the same room with you.  Separate yourself from other people in your home As much as possible, you should stay in a different room from other people in your home. Also, you should use a separate bathroom, if available.  Avoid sharing household items You should not share  dishes, drinking glasses, cups, eating utensils, towels, bedding, or other items with other people in your home. After using these items, you should wash them thoroughly with soap and water.  Cover your coughs and sneezes Cover your mouth and nose with a tissue when you cough or sneeze, or you can cough or sneeze into  your sleeve. Throw used tissues in a lined trash can, and immediately wash your hands with soap and water for at least 20 seconds or use an alcohol-based hand rub.  Wash your [facility] your hands often and thoroughly with soap and water for at least 20 seconds. You can use an alcohol-based hand sanitizer if soap and water are not available and if your hands are not visibly dirty. Avoid touching your eyes, nose, and mouth with unwashed hands.   Prevention Steps for Caregivers and Household Members of Individuals Confirmed to have, or Being Evaluated for, COVID-19 Infection Being Cared for in the Home  If you live with, or provide care at home for, a person confirmed to have, or being evaluated for, COVID-19 infection please follow these guidelines to prevent infection:  Follow healthcare providers instructions Make sure that you understand and can help the patient follow any healthcare provider instructions for all care.  Provide for the patients basic needs You should help the patient with basic needs in the home and provide support for getting groceries, prescriptions, and other personal needs.  Monitor the patients symptoms If they are getting sicker, call his or her medical provider and tell them that the patient has, or is being evaluated for, COVID-19 infection. This will help the healthcare providers office take steps to keep other people from getting infected. Ask the healthcare provider to call the local or state health department.  Limit the number of people who have contact with the patient  If possible, have only one caregiver for the patient.  Other household members should stay in another home or place of residence. If this is not possible, they should stay  in another room, or be separated from the patient as much as possible. Use a separate bathroom, if available.  Restrict visitors who do not have an essential need to be in the home.  Keep older adults, very  young children, and other sick people away from the patient Keep older adults, very young children, and those who have compromised immune systems or chronic health conditions away from the patient. This includes people with chronic heart, lung, or kidney conditions, diabetes, and cancer.  Ensure good ventilation Make sure that shared spaces in the home have good air flow, such as from an air conditioner or an opened window, weather permitting.  Wash your hands often  Wash your hands often and thoroughly with soap and water for at least 20 seconds. You can use an alcohol based hand sanitizer if soap and water are not available and if your hands are not visibly dirty.  Avoid touching your eyes, nose, and mouth with unwashed hands.  Use disposable paper towels to dry your hands. If not available, use dedicated cloth towels and replace them when they become wet.  Wear a facemask and gloves  Wear a disposable facemask at all times in the room and gloves when you touch or have contact with the patients blood, body fluids, and/or secretions or excretions, such as sweat, saliva, sputum, nasal mucus, vomit, urine, or feces.  Ensure the mask fits over your nose and mouth tightly, and do not touch it during  use.  Throw out disposable facemasks and gloves after using them. Do not reuse.  Wash your hands immediately after removing your facemask and gloves.  If your personal clothing becomes contaminated, carefully remove clothing and launder. Wash your hands after handling contaminated clothing.  Place all used disposable facemasks, gloves, and other waste in a lined container before disposing them with other household waste.  Remove gloves and wash your hands immediately after handling these items.  Do not share dishes, glasses, or other household items with the patient  Avoid sharing household items. You should not share dishes, drinking glasses, cups, eating utensils, towels, bedding, or other  items with a patient who is confirmed to have, or being evaluated for, COVID-19 infection.  After the person uses these items, you should wash them thoroughly with soap and water.  Wash laundry thoroughly  Immediately remove and wash clothes or bedding that have blood, body fluids, and/or secretions or excretions, such as sweat, saliva, sputum, nasal mucus, vomit, urine, or feces, on them.  Wear gloves when handling laundry from the patient.  Read and follow directions on labels of laundry or clothing items and detergent. In general, wash and dry with the warmest temperatures recommended on the label.  Clean all areas the individual has used often  Clean all touchable surfaces, such as counters, tabletops, doorknobs, bathroom fixtures, toilets, phones, keyboards, tablets, and bedside tables, every day. Also, clean any surfaces that may have blood, body fluids, and/or secretions or excretions on them.  Wear gloves when cleaning surfaces the patient has come in contact with.  Use a diluted bleach solution (e.g., dilute bleach with 1 part bleach and 10 parts water) or a household disinfectant with a label that says EPA-registered for coronaviruses. To make a bleach solution at home, add 1 tablespoon of bleach to 1 quart (4 cups) of water. For a larger supply, add  cup of bleach to 1 gallon (16 cups) of water.  Read labels of cleaning products and follow recommendations provided on product labels. Labels contain instructions for safe and effective use of the cleaning product including precautions you should take when applying the product, such as wearing gloves or eye protection and making sure you have good ventilation during use of the product.  Remove gloves and wash hands immediately after cleaning.  Monitor yourself for signs and symptoms of illness Caregivers and household members are considered close contacts, should monitor their health, and will be asked to limit movement outside of  the home to the extent possible. Follow the monitoring steps for close contacts listed on the symptom monitoring form.   ? If you have additional questions, contact your local health department or call the epidemiologist on call at [phone] (available 24/7). ? This guidance is subject to change. For the most up-to-date guidance from [facility], please refer to their website: [email or web address]

## 2019-04-04 NOTE — Plan of Care (Signed)
  Problem: Education: Goal: Knowledge of General Education information will improve Description: Including pain rating scale, medication(s)/side effects and non-pharmacologic comfort measures 04/04/2019 1116 by Haynes Bast, RN Outcome: Adequate for Discharge 04/04/2019 0831 by Haynes Bast, RN Outcome: Progressing   Problem: Health Behavior/Discharge Planning: Goal: Ability to manage health-related needs will improve 04/04/2019 1116 by Haynes Bast, RN Outcome: Adequate for Discharge 04/04/2019 0831 by Haynes Bast, RN Outcome: Progressing   Problem: Clinical Measurements: Goal: Respiratory complications will improve 04/04/2019 1116 by Haynes Bast, RN Outcome: Adequate for Discharge 04/04/2019 0831 by Haynes Bast, RN Outcome: Progressing   Problem: Activity: Goal: Risk for activity intolerance will decrease 04/04/2019 1116 by Haynes Bast, RN Outcome: Adequate for Discharge 04/04/2019 0831 by Haynes Bast, RN Outcome: Progressing   Problem: Coping: Goal: Level of anxiety will decrease 04/04/2019 1116 by Haynes Bast, RN Outcome: Adequate for Discharge 04/04/2019 0831 by Haynes Bast, RN Outcome: Progressing   Problem: Pain Managment: Goal: General experience of comfort will improve 04/04/2019 1116 by Haynes Bast, RN Outcome: Adequate for Discharge 04/04/2019 0831 by Haynes Bast, RN Outcome: Progressing   Problem: Safety: Goal: Ability to remain free from injury will improve 04/04/2019 1116 by Haynes Bast, RN Outcome: Adequate for Discharge 04/04/2019 0831 by Haynes Bast, RN Outcome: Progressing   Problem: Skin Integrity: Goal: Risk for impaired skin integrity will decrease 04/04/2019 1116 by Haynes Bast, RN Outcome: Adequate for Discharge 04/04/2019 0831 by Haynes Bast, RN Outcome: Progressing

## 2019-04-04 NOTE — Progress Notes (Signed)
Lying on left side

## 2019-04-04 NOTE — Progress Notes (Signed)
Patient daughter made aware of discharge home today, paperwork pending at this time. She was given the address and informed of our dc process. The patient is dressing herself, and paperwork being prepared.

## 2019-04-04 NOTE — Discharge Summary (Signed)
Amanda Shepherd BBU:[id number] DOB: Aug 21, 1951 DOA: 04/01/2019  PCP:  Amanda Shepherd  Admit date: 04/01/2019  Discharge date: 04/04/2019  Admitted From: Home   Disposition:  Home   Recommendations for Outpatient Follow-up:   Follow up with PCP in 1-2 weeks  PCP Please obtain BMP/CBC, 2 view CXR in 1week,  (see Discharge instructions)   PCP Please follow up on the following pending results: CBC, BMP and a two-view chest x-ray within 7 days.   Home Health: RN Equipment/Devices: None  Consultations: None Discharge Condition: Stable   CODE STATUS: Full   Diet Recommendation: Heart Healthy Low Carb    Chief Complaint  Patient presents with  . Fatigue     Brief history of present illness from the day of admission and additional interim summary    RosaJonesis a 68 y.o.femalewith a known history of bronchitis, DM, HLDpresents to the emergency department for evaluation of one month of cough. Patient was in a usual state of health until one month ago, she was mowing the lawn and developed a cough productive of green sputum which she attributed to her allergies. She was seen via telemedicine 2 days ago and treated forallergies.Symptoms have been refractory to Flonase, Albuterol, Tessalon.  Patient denies fevers/chills, dizziness, chest pain, shortness of breath, N/V/C/D, abdominal pain, dysuria/frequency, changes in mental status.   Otherwise there has been no change in status. Patient has been taking medication as prescribed and there has been no recent change in medication or diet. No recent antibiotics. There has been no recent illness, hospitalizations, travel or sick contacts.   EMS/ED Course:Patient received Rocephin and azithromycin. Medical admission has been requested for further management  ofCOVID pneumonia.                                                                 Hospital Course    1.  Acute Covid 19 Viral Illness during the ongoing 2020 Covid 19 Pandemic - minimal to no symptoms with stable chest x-ray and no hypoxia, did have mildly elevated inflammatory markers hence she was kept on low-dose steroids, she is at baseline now will be given a short course of oral steroid taper and discharged home with close PCP follow-up.  2.  Hypertension.    No acute issues now blood pressure was slightly low hence will skip ACE inhibitor for a week upon discharge.  3.  Dehydration with AKI.  Resolved after aggressive IV fluid hydration.  Repeat BMP in 7 to 10 days by PCP.  4.  1 out of 2 blood cultures drawn in the ER positive for coccobacilli gram-negative.  Could be contaminant, he is discussed with Dr. Alton Shepherd ID, she was treated here with IV Rocephin clinically she has defervesced with no fever, procalcitonin was negative pointing more towards contamination, 5-day  course of Levaquin upon discharge.   5.  DM type II.  Poor outpatient control due to hyperglycemia with A1c of 10.2.  Add sliding scale to home regimen, insulin education here and at home.  PCP to monitor glycemic control and adjust medications as needed.     Discharge diagnosis     Active Problems:   COVID-19 virus infection    Discharge instructions    Discharge Instructions    Amb Referral to Nutrition and Diabetic E   Complete by: As directed    Discharge instructions   Complete by: As directed    Follow with Primary MD Amanda Amanda Shepherd in 7 days   Get CBC, CMP, 2 view Chest X ray -  checked next visit within 1 week by Primary MD   Activity: As tolerated with Full fall precautions use walker/cane & assistance as needed  Disposition Home   Diet: Heart Healthy  Low Carb  Accuchecks 4 times/day, Once in AM empty stomach and then before each meal. Log in all results and show them to your  Prim.MD in 3 days. If any glucose reading is under 80 or above 300 call your Prim MD immidiately. Follow Low glucose instructions for glucose under 80 as instructed.   Special Instructions: If you have smoked or chewed Tobacco  in the last 2 yrs please stop smoking, stop any regular Alcohol  and or any Recreational drug use.  On your next visit with your primary care physician please Get Medicines reviewed and adjusted.  Please request your Prim.MD to go over all Hospital Tests and Procedure/Radiological results at the follow up, please get all Hospital records sent to your Prim MD by signing hospital release before you go home.  If you experience worsening of your admission symptoms, develop shortness of breath, life threatening emergency, suicidal or homicidal thoughts you must seek medical attention immediately by calling 911 or calling your MD immediately  if symptoms less severe.  You Must read complete instructions/literature along with all the possible adverse reactions/side effects for all the Medicines you take and that have been prescribed to you. Take any new Medicines after you have completely understood and accpet all the possible adverse reactions/side effects.   Increase activity slowly   Complete by: As directed    MyChart COVID-19 home monitoring program   Complete by: Apr 04, 2019    Is the patient willing to use the Sun for home monitoring?: Yes   Temperature monitoring   Complete by: Apr 04, 2019    After how many days would you like to receive a notification of this patient's flowsheet entries?: 1      Discharge Medications   Allergies as of 04/04/2019      Reactions   Atorvastatin Other (See Comments)   "did not feel right"      Medication List    TAKE these medications   acetaminophen 325 MG tablet Commonly known as: TYLENOL Take 650 mg by mouth at bedtime as needed for fever.   albuterol 108 (90 Base) MCG/ACT inhaler Commonly known as:  VENTOLIN HFA Inhale 1-2 puffs into the lungs every 6 (six) hours as needed for wheezing or shortness of breath.   aspirin EC 81 MG tablet Take 81 mg by mouth daily.   benzonatate 100 MG capsule Commonly known as: TESSALON Take 1 capsule (100 mg total) by mouth every 8 (eight) hours.   blood glucose meter kit and supplies Dispense Glucometer + testing supplies  QAC-HS based on patient and insurance preference. Use up to four times daily as directed. (FOR ICD-9 250.00, 250.01). 1 month supply.   cetirizine 10 MG tablet Commonly known as: ZYRTEC Take 1 tablet (10 mg total) by mouth daily.   dapagliflozin propanediol 10 MG Tabs tablet Commonly known as: Farxiga Take 10 mg by mouth daily.   fluticasone 50 MCG/ACT nasal spray Commonly known as: FLONASE Place 1 spray into both nostrils daily.   glucose blood test strip Commonly known as: ONE TOUCH ULTRA TEST Check sugar 2 x daily   guaiFENesin 100 MG/5ML liquid Commonly known as: ROBITUSSIN Take 200 mg by mouth 3 (three) times daily as needed for cough.   insulin aspart 100 UNIT/ML injection Commonly known as: NovoLOG Before each meal 3 times a day , 140-199 - 2 units, 200-250 - 4 units, 251-299 - 6 units,  300-349 - 8 units,  350 or above 10 units. Insulin PEN if approved, provide syringes and needles if needed. Can switch to any other cheaper alternative.   Insulin Pen Needle 32G X 4 MM Misc Commonly known as: BD Pen Needle Nano U/F Use 1 pen needle with each insulin dose once a day   Insulin Syringe-Needle U-100 25G X 1" 1 ML Misc For 4 times a day insulin SQ, 1 month supply. Diagnosis E11.65   levofloxacin 500 MG tablet Commonly known as: Levaquin Take 1 tablet (500 mg total) by mouth daily for 10 days.   lisinopril 10 MG tablet Commonly known as: ZESTRIL Take 1 tablet (10 mg total) by mouth daily. Get BMP checked by PCP before resuming this medication in a week. Start taking on: April 11, 2019 What changed:    additional instructions  These instructions start on April 11, 2019. If you are unsure what to Shepherd until then, ask your doctor or other care provider.   metFORMIN 1000 MG tablet Commonly known as: GLUCOPHAGE TAKE 1 TABLET BY MOUTH TWICE DAILY WITH A MEAL What changed:   how much to take  how to take this  when to take this  additional instructions   ONE TOUCH ULTRA 2 w/Device Kit 1 Device by Does not apply route 2 (two) times daily.   OneTouch Delica Lancets 49Q Misc 1 Device by Does not apply route 2 (two) times daily.   predniSONE 5 MG tablet Commonly known as: DELTASONE Label  & dispense according to the schedule below. take 8 Pills PO for 3 days, 6 Pills PO for 3 days, 4 Pills PO for 3 days, 2 Pills PO for 3 days, 1 Pills PO for 3 days, 1/2 Pill  PO for 3 days then STOP. Total 65 pills.       Follow-up Information    Lahaina Amanda Shepherd. Schedule an appointment as soon as possible for a visit in 1 week(s).   Specialty: Family Medicine Contact information: [place] [address] [phone]           Major procedures and Radiology Reports - PLEASE review detailed and final reports thoroughly  -         Dg Chest 2 View  Result Date: 03/28/2019 CLINICAL DATA:  Per pt: Bad cough x 2 weeks. No injury. No Sob or wheezing. History of diabetes, chronic bronchitis, and allergies. Nonsmokercough x 2 weeks EXAM: CHEST - 2 VIEW COMPARISON:  None. FINDINGS: Normal mediastinum and cardiac silhouette. Normal pulmonary vasculature. No evidence of effusion, infiltrate, or pneumothorax. No acute bony abnormality. IMPRESSION: No  acute cardiopulmonary process. Electronically Signed   By: Suzy Bouchard M.D.   On: 03/28/2019 12:11   Dg Chest Portable 1 View  Result Date: 04/01/2019 CLINICAL DATA:  68 y.o female reports for the past month feeling drained/fatigue with intermittent loose stools and decreased appetite. Reports been taking medications that her PCP and  UC have prescribed her but still not feeling well. EXAM: PORTABLE CHEST 1 VIEW COMPARISON:  03/28/2019 FINDINGS: The heart size and mediastinal contours are within normal limits. Both lungs are clear. Thoracic spondylosis is noted. IMPRESSION: No active disease. Electronically Signed   By: Van Clines M.D.   On: 04/01/2019 14:12      Recent Results (from the past 240 hour(s))  Blood culture (routine x 2)     Status: Abnormal (Preliminary result)   Collection Time: 04/01/19  2:09 PM   Specimen: BLOOD LEFT FOREARM  Result Value Ref Range Status   Specimen Description   Final    BLOOD LEFT FOREARM Performed at [facility] [address] [place], [place] [address]    Special Requests   Final    BOTTLES DRAWN AEROBIC AND ANAEROBIC Blood Culture results may not be optimal due to an inadequate volume of blood received in culture bottles Performed at [facility] [address], [place], [place] [address]    Culture  Setup Time   Final    AEROBIC BOTTLE ONLY GRAM NEGATIVE COCCOBACILLI CRITICAL RESULT CALLED TO, READ BACK BY AND VERIFIED WITH: PHARMD SCOTT CHRISTIE [id number] FCP    Culture (A)  Final    ACINETOBACTER LWOFFII SUSCEPTIBILITIES TO FOLLOW Performed at [facility], [place] [address] [place], [place] [address]    Report Status PENDING  Incomplete  Blood Culture ID Panel (Reflexed)     Status: None   Collection Time: 04/01/19  2:09 PM  Result Value Ref Range Status   Enterococcus species NOT DETECTED NOT DETECTED Final   Listeria monocytogenes NOT DETECTED NOT DETECTED Final   Staphylococcus species NOT DETECTED NOT DETECTED Final   Staphylococcus aureus (BCID) NOT DETECTED NOT DETECTED Final   Streptococcus species NOT DETECTED NOT DETECTED Final   Streptococcus agalactiae NOT DETECTED NOT DETECTED Final   Streptococcus pneumoniae NOT DETECTED NOT DETECTED Final   Streptococcus pyogenes NOT DETECTED NOT DETECTED Final    Acinetobacter baumannii NOT DETECTED NOT DETECTED Final   Enterobacteriaceae species NOT DETECTED NOT DETECTED Final   Enterobacter cloacae complex NOT DETECTED NOT DETECTED Final   Escherichia coli NOT DETECTED NOT DETECTED Final   Klebsiella oxytoca NOT DETECTED NOT DETECTED Final   Klebsiella pneumoniae NOT DETECTED NOT DETECTED Final   Proteus species NOT DETECTED NOT DETECTED Final   Serratia marcescens NOT DETECTED NOT DETECTED Final   Haemophilus influenzae NOT DETECTED NOT DETECTED Final   Neisseria meningitidis NOT DETECTED NOT DETECTED Final   Pseudomonas aeruginosa NOT DETECTED NOT DETECTED Final   Candida albicans NOT DETECTED NOT DETECTED Final   Candida glabrata NOT DETECTED NOT DETECTED Final   Candida krusei NOT DETECTED NOT DETECTED Final   Candida parapsilosis NOT DETECTED NOT DETECTED Final   Candida tropicalis NOT DETECTED NOT DETECTED Final    Comment: Performed at [facility], [place] [address], [place], [place] [address]  SARS Coronavirus 2 (CEPHEID- Performed in [facility] hospital lab), Hosp Order     Status: Abnormal   Collection Time: 04/01/19  3:47 PM   Specimen: Nasopharyngeal Swab  Result Value Ref Range Status  SARS Coronavirus 2 POSITIVE (A) NEGATIVE Final    Comment: RESULT CALLED TO, READ BACK BY AND VERIFIED WITH: B.HINDERLY,PAC [id number] _0  BY V.WILKINS  (NOTE) If result is NEGATIVE SARS-CoV-2 target nucleic acids are NOT DETECTED. The SARS-CoV-2 RNA is generally detectable in upper and lower  respiratory specimens during the acute phase of infection. The lowest  concentration of SARS-CoV-2 viral copies this assay can detect is 250  copies / mL. A negative result does not preclude SARS-CoV-2 infection  and should not be used as the sole basis for treatment or other  patient management decisions.  A negative result may occur with  improper specimen collection / handling, submission of specimen other  than nasopharyngeal swab, presence of  viral mutation(s) within the  areas targeted by this assay, and inadequate number of viral copies  (<250 copies / mL). A negative result must be combined with clinical  observations, patient history, and epidemiological information. If result is POSITIVE SARS-CoV-2 target nucleic acids are DETECT ED. The SARS-CoV-2 RNA is generally detectable in upper and lower  respiratory specimens during the acute phase of infection.  Positive  results are indicative of active infection with SARS-CoV-2.  Clinical  correlation with patient history and other diagnostic information is  necessary to determine patient infection status.  Positive results Shepherd  not rule out bacterial infection or co-infection with other viruses. If result is PRESUMPTIVE POSTIVE SARS-CoV-2 nucleic acids MAY BE PRESENT.   A presumptive positive result was obtained on the submitted specimen  and confirmed on repeat testing.  While 2019 novel coronavirus  (SARS-CoV-2) nucleic acids may be present in the submitted sample  additional confirmatory testing may be necessary for epidemiological  and / or clinical management purposes  to differentiate between  SARS-CoV-2 and other Sarbecovirus currently known to infect humans.  If clinically indicated additional testing with an alternate test  methodology (LAB74 [id number]) is advised. The SARS-CoV-2 RNA is generally  detectable in upper and lower respiratory specimens during the acute  phase of infection. The expected result is Negative. Fact Sheet for Patients:  StrictlyIdeas.no Fact Sheet for Healthcare Providers: BankingDealers.co.za This test is not yet approved or cleared by the [place] FDA and has been authorized for detection and/or diagnosis of SARS-CoV-2 by FDA under an Emergency Use Authorization (EUA).  This EUA will remain in effect (meaning this test can be used) for the duration of the COVID-19 declaration under Section  564(b)(1) of the Act, 21 U.S.C. section 360bbb-3(b)(1), unless the authorization is terminated or revoked sooner. Performed at [facility], [place] [address], [place], [place] [address]   Blood culture (routine x 2)     Status: None (Preliminary result)   Collection Time: 04/01/19 11:30 PM   Specimen: BLOOD  Result Value Ref Range Status   Specimen Description   Final    BLOOD RIGHT ANTECUBITAL Performed at [facility] [address] [place], [place] [address]    Special Requests   Final    BOTTLES DRAWN AEROBIC ONLY Blood Culture adequate volume Performed at [facility] [address], [place], [place] [address]    Culture   Final    NO GROWTH < 24 HOURS Performed at [facility] [address] [place], [place] [address]    Report Status PENDING  Incomplete  Respiratory Panel by PCR     Status: None   Collection Time: 04/02/19  1:42 AM   Specimen: Nasopharyngeal Swab; Respiratory  Result  Value Ref Range Status   Adenovirus NOT DETECTED NOT DETECTED Final   Coronavirus 229E NOT DETECTED NOT DETECTED Final    Comment: (NOTE) The Coronavirus on the Respiratory Panel, DOES NOT test for the novel  Coronavirus (2019 nCoV)    Coronavirus HKU1 NOT DETECTED NOT DETECTED Final   Coronavirus NL63 NOT DETECTED NOT DETECTED Final   Coronavirus OC43 NOT DETECTED NOT DETECTED Final   Metapneumovirus NOT DETECTED NOT DETECTED Final   Rhinovirus / Enterovirus NOT DETECTED NOT DETECTED Final   Influenza A NOT DETECTED NOT DETECTED Final   Influenza B NOT DETECTED NOT DETECTED Final   Parainfluenza Virus 1 NOT DETECTED NOT DETECTED Final   Parainfluenza Virus 2 NOT DETECTED NOT DETECTED Final   Parainfluenza Virus 3 NOT DETECTED NOT DETECTED Final   Parainfluenza Virus 4 NOT DETECTED NOT DETECTED Final   Respiratory Syncytial Virus NOT DETECTED NOT DETECTED Final   Bordetella pertussis NOT DETECTED NOT DETECTED Final    Chlamydophila pneumoniae NOT DETECTED NOT DETECTED Final   Mycoplasma pneumoniae NOT DETECTED NOT DETECTED Final    Comment: Performed at [facility], [place] [address] [place], [place] [address]    Today   Subjective    Amanda Shepherd today has no headache,no chest abdominal pain,no new weakness tingling or numbness, feels much better wants to go home today.     Objective   Blood pressure 124/70, pulse 90, temperature 98.8 F (37.1 C), temperature source Oral, resp. rate 19, height _0  (1.676 m), weight 61 kg, SpO2 97 %.   Intake/Output Summary (Last 24 hours) at 04/04/2019 1040 Last data filed at 04/04/2019 0826 Gross per 24 hour  Intake 1340.33 ml  Output -  Net 1340.33 ml    Exam  Awake Alert, Oriented x 3, No new F.N deficits, Normal affect [place].AT,PERRAL Supple Neck,No JVD, No cervical lymphadenopathy appriciated.  Symmetrical Chest wall movement, Good air movement bilaterally, CTAB RRR,No Gallops,Rubs or new Murmurs, No Parasternal Heave +ve B.Sounds, Abd Soft, Non tender, No organomegaly appriciated, No rebound -guarding or rigidity. No Cyanosis, Clubbing or edema, No new Rash or bruise   Data Review   CBC w Diff:  Lab Results  Component Value Date   WBC 8.8 04/04/2019   HGB 11.5 (L) 04/04/2019   HGB 11.9 06/07/2017   HCT 36.1 04/04/2019   HCT 35.2 06/07/2017   PLT 314 04/04/2019   PLT 339 06/07/2017   LYMPHOPCT 21 04/04/2019   MONOPCT 7 04/04/2019   EOSPCT 3 04/04/2019   BASOPCT 0 04/04/2019    CMP:  Lab Results  Component Value Date   NA 142 04/04/2019   NA 139 06/07/2017   K 3.6 04/04/2019   CL 107 04/04/2019   CO2 22 04/04/2019   BUN 19 04/04/2019   BUN 20 06/07/2017   CREATININE 1.20 (H) 04/04/2019   CREATININE 0.85 03/06/2013   PROT 5.9 (L) 04/04/2019   PROT 7.6 06/07/2017   ALBUMIN 2.3 (L) 04/04/2019   ALBUMIN 4.4 06/07/2017   BILITOT 0.8 04/04/2019   BILITOT 0.4 06/07/2017   ALKPHOS 61 04/04/2019   AST 74 (H) 04/04/2019   ALT 29  04/04/2019  . Lab Results  Component Value Date   HGBA1C 10.2 (H) 04/02/2019    COVID-19 Labs  Recent Labs    04/02/19 0315 04/02/19 0945 04/03/19 0528 04/04/19 0430  DDIMER 3.53* 3.57* 3.73* 2.90*  FERRITIN 516*  --  621* 630*  LDH 322*  --   --   --  CRP 8.0* 8.9* 5.3* 3.8*    Lab Results  Component Value Date   SARSCOV2NAA POSITIVE (A) 04/01/2019     Total Time in preparing paper work, data evaluation and todays exam - 54 minutes  Lala Lund M.D on 04/04/2019 at 10:40 AM  [facility]   Office  [phone]

## 2019-04-04 NOTE — Progress Notes (Signed)
Inpatient Diabetes Program Recommendations  AACE/ADA: New Consensus Statement on Inpatient Glycemic Control (2015)  Target Ranges:  Prepandial:   less than 140 mg/dL      Peak postprandial:   less than 180 mg/dL (1-2 hours)      Critically ill patients:  140 - 180 mg/dL   Lab Results  Component Value Date   GLUCAP 110 (H) 04/04/2019   HGBA1C 10.2 (H) 04/02/2019    Review of Glycemic Control  Spoke with pt regarding her HgbA1C of 10.2% and goal of 7%. Pt states she DOES NOT want to go home on insulin. She will try to do better with making healthy diet choices, exercise and take her metformin and [place] each day. Pt instructed to make appt with PCP and discuss getting a Libre CGM, as she doesn't like sticking fingers each day. Pt did say that she would check blood sugars at least 2x/day and record in logbook. Instructed to take logbook with her to MD appt. MD to review and adjustments can be made with meds if needed. Pt again states she does not want to start on insulin at this time, even though her blood sugars were excellent while inpatient.  Inpatient Diabetes Program Recommendations:     Discharge on metformin 1000 mg bid and Farxiga 10 mg QD.  Answered all questions and pt seems to be motivated to better control her blood sugars at home.   Thank you. Lorenda Peck, RD, LDN, CDE Inpatient Diabetes Coordinator [phone]

## 2019-04-05 ENCOUNTER — Telehealth: Payer: Self-pay | Admitting: *Deleted

## 2019-04-05 LAB — CULTURE, BLOOD (ROUTINE X 2)

## 2019-04-05 NOTE — Telephone Encounter (Signed)
Pt lm on nurse line stating "Please call me back".    Attempted to call back but had to LMOVM for pt to return call. Christen Bame, CMA

## 2019-04-07 LAB — CULTURE, BLOOD (ROUTINE X 2)
Culture: NO GROWTH
Special Requests: ADEQUATE

## 2019-05-15 ENCOUNTER — Other Ambulatory Visit: Payer: Self-pay | Admitting: Family Medicine

## 2019-05-15 DIAGNOSIS — Z1231 Encounter for screening mammogram for malignant neoplasm of breast: Secondary | ICD-10-CM

## 2019-05-17 ENCOUNTER — Ambulatory Visit
Admission: RE | Admit: 2019-05-17 | Discharge: 2019-05-17 | Disposition: A | Discharge status: Home / Self Care | Payer: BC Managed Care – PPO | Source: Ambulatory Visit | Attending: Family Medicine | Admitting: Family Medicine

## 2019-05-17 ENCOUNTER — Other Ambulatory Visit: Payer: Self-pay

## 2019-05-17 DIAGNOSIS — Z1231 Encounter for screening mammogram for malignant neoplasm of breast: Secondary | ICD-10-CM

## 2019-06-28 ENCOUNTER — Ambulatory Visit: Payer: BC Managed Care – PPO | Admitting: Dietician

## 2019-06-28 ENCOUNTER — Ambulatory Visit: Payer: BC Managed Care – PPO | Admitting: Registered"

## 2019-08-09 LAB — BLOOD GAS, VENOUS
Drawn by: 295031
FIO2: 21
O2 Saturation: 88.8 %
Patient temperature: 98.6
pH, Ven: 7.463 — ABNORMAL HIGH (ref 7.250–7.430)
pO2, Ven: 56.8 mmHg — ABNORMAL HIGH (ref 32.0–45.0)

## 2019-11-27 ENCOUNTER — Ambulatory Visit: Payer: BC Managed Care – PPO | Attending: Physician Assistant

## 2019-11-27 DIAGNOSIS — Z23 Encounter for immunization: Secondary | ICD-10-CM

## 2019-11-27 NOTE — Progress Notes (Signed)
   Covid-19 Vaccination Clinic  Name:  LUCELLE FLECKER    MRN: [id number] DOB: 1951/03/02  11/27/2019  Ms. Lantigua was observed post Covid-19 immunization for 15 minutes without incidence. She was provided with Vaccine Information Sheet and instruction to access the V-Safe system.   Ms. Melley was instructed to call 911 with any severe reactions post vaccine: Marland Kitchen Difficulty breathing  . Swelling of your face and throat  . A fast heartbeat  . A bad rash all over your body  . Dizziness and weakness    Immunizations Administered    Name Date Dose VIS Date Route   Pfizer COVID-19 Vaccine 11/27/2019  1:53 PM 0.3 mL 09/29/2019 Intramuscular   Manufacturer: [place]   Lot: [id number]   [place]: [id number]

## 2019-12-22 ENCOUNTER — Ambulatory Visit: Payer: BC Managed Care – PPO | Attending: Internal Medicine

## 2019-12-22 DIAGNOSIS — Z23 Encounter for immunization: Secondary | ICD-10-CM

## 2019-12-22 NOTE — Progress Notes (Signed)
   Covid-19 Vaccination Clinic  Name:  Amanda Shepherd    MRN: [id number] DOB: December 09, 1950  12/22/2019  [name] was observed post Covid-19 immunization for 15 minutes without incident. She was provided with Vaccine Information Sheet and instruction to access the V-Safe system.   [name] was instructed to call 911 with any severe reactions post vaccine: Marland Kitchen Difficulty breathing  . Swelling of face and throat  . A fast heartbeat  . A bad rash all over body  . Dizziness and weakness   Immunizations Administered    Name Date Dose VIS Date Route   Pfizer COVID-19 Vaccine 12/22/2019  8:19 AM 0.3 mL 09/29/2019 Intramuscular   Manufacturer: [place]   Lot: [id number]   [place]: [id number]

## 2020-01-09 ENCOUNTER — Telehealth: Payer: Self-pay | Admitting: *Deleted

## 2020-01-09 NOTE — Telephone Encounter (Signed)
Rx request for farxiga 10mg  tablets. Please advise. Densil Ottey Kennon Holter, CMA

## 2020-01-10 ENCOUNTER — Other Ambulatory Visit: Payer: Self-pay | Admitting: Family Medicine

## 2020-01-10 DIAGNOSIS — IMO0002 Reserved for concepts with insufficient information to code with codable children: Secondary | ICD-10-CM

## 2020-01-10 DIAGNOSIS — E11319 Type 2 diabetes mellitus with unspecified diabetic retinopathy without macular edema: Secondary | ICD-10-CM

## 2020-01-10 MED ORDER — FARXIGA 10 MG PO TABS: 10.0000 mg | ORAL_TABLET | Freq: Every day | ORAL | 3 refills | Status: DC

## 2020-01-10 NOTE — Progress Notes (Signed)
Patient left message with staff requesting refill of Farxiga. I am refilling and will send message to staff to contact her to let her know I have refilled her prescription and that she should come in for a routine visit. It has been over a year since she has been seen for routine care to manage her diabetes.

## 2020-01-23 NOTE — Telephone Encounter (Signed)
Pt informed and scheduled. Saron Tweed, CMA  

## 2020-02-06 ENCOUNTER — Ambulatory Visit: Payer: Medicare Other | Admitting: Family Medicine

## 2020-02-16 ENCOUNTER — Other Ambulatory Visit: Payer: Self-pay

## 2020-02-16 DIAGNOSIS — E11319 Type 2 diabetes mellitus with unspecified diabetic retinopathy without macular edema: Secondary | ICD-10-CM

## 2020-02-16 DIAGNOSIS — IMO0002 Reserved for concepts with insufficient information to code with codable children: Secondary | ICD-10-CM

## 2020-02-16 MED ORDER — METFORMIN HCL 1000 MG PO TABS: 1000.0000 mg | ORAL_TABLET | Freq: Two times a day (BID) | ORAL | 2 refills | Status: DC

## 2020-02-23 ENCOUNTER — Ambulatory Visit: Payer: Medicare Other

## 2020-04-02 ENCOUNTER — Ambulatory Visit (INDEPENDENT_AMBULATORY_CARE_PROVIDER_SITE_OTHER): Payer: Medicare Other | Admitting: Family Medicine

## 2020-04-02 ENCOUNTER — Other Ambulatory Visit: Payer: Self-pay

## 2020-04-02 VITALS — BP 125/70 | HR 72 | Ht 65.95 in | Wt 132.6 lb

## 2020-04-02 DIAGNOSIS — E1165 Type 2 diabetes mellitus with hyperglycemia: Secondary | ICD-10-CM | POA: Diagnosis not present

## 2020-04-02 DIAGNOSIS — E11319 Type 2 diabetes mellitus with unspecified diabetic retinopathy without macular edema: Secondary | ICD-10-CM | POA: Diagnosis present

## 2020-04-02 DIAGNOSIS — Z23 Encounter for immunization: Secondary | ICD-10-CM | POA: Diagnosis not present

## 2020-04-02 DIAGNOSIS — I10 Essential (primary) hypertension: Secondary | ICD-10-CM

## 2020-04-02 DIAGNOSIS — IMO0002 Reserved for concepts with insufficient information to code with codable children: Secondary | ICD-10-CM

## 2020-04-02 DIAGNOSIS — E113299 Type 2 diabetes mellitus with mild nonproliferative diabetic retinopathy without macular edema, unspecified eye: Secondary | ICD-10-CM

## 2020-04-02 DIAGNOSIS — E1169 Type 2 diabetes mellitus with other specified complication: Secondary | ICD-10-CM | POA: Diagnosis not present

## 2020-04-02 DIAGNOSIS — E785 Hyperlipidemia, unspecified: Secondary | ICD-10-CM

## 2020-04-02 LAB — POCT GLYCOSYLATED HEMOGLOBIN (HGB A1C): HbA1c, POC (controlled diabetic range): 8.3 % — AB (ref 0.0–7.0)

## 2020-04-02 MED ORDER — DAPAGLIFLOZIN PROPANEDIOL 10 MG PO TABS: 10.0000 mg | ORAL_TABLET | Freq: Every day | ORAL | 3 refills | Status: DC

## 2020-04-02 MED ORDER — METFORMIN HCL 1000 MG PO TABS: 1000.0000 mg | ORAL_TABLET | Freq: Two times a day (BID) | ORAL | 2 refills | Status: DC

## 2020-04-02 NOTE — Progress Notes (Signed)
    SUBJECTIVE:   CHIEF COMPLAINT / HPI:    DM Patient presents for follow up for her diabetes. She is compliant with her medications and denies any adverse effects. She is not on insulin and has not been taking it for a "long time" as it was making her blood sugar "too low". She is complaint on taking metformin and farxiga. She does not want to start a new medication for her diabetes instead and states she will cut out drinking all sodas as she thinks this is contributing to her poorly controlled diabetes. She reports no hypoglycemic events and tolerates metformin and farxiga well. She is willing to try Ozepmic if her A1c is still above goal at her next check up.  HTN Patient reports not taking Lisinopril for some time. She states she thought she was taking this medication to help her kidneys and states she has "never had high blood pressure" although our records indicate she did. Interestingly, she is normotensive and she has not taken any blood pressure medication in about one year she says. She denies vision changes, headaches, or dizziness.   HLD Patient quit taking Atorvastatin as it "messed with my memory" and she reports it made her feel weird and not right. She is willing to try a different statin if her cholesterol is high again today.  Health Maintenance PSV23 due today. Patient gave consent and shot given. Diabetic foot exam given today. Still needs colonoscopy, DEXA scan, and tetanus. Will address at next visit.   PERTINENT  PMH / PSH: HTN, Hx of COVID-19, T2DM, HLD  OBJECTIVE:   BP 125/70   Pulse 72   Ht 5' 5.95" (1.675 m)   Wt 132 lb 9.6 oz (60.1 kg)   SpO2 98%   BMI 21.44 kg/m   Gen: NAD Cardiac: RRR, no murmurs Resp: CTAB Ext: No swelling, no edema   ASSESSMENT/PLAN:   Primary hypertension Normotensive and has not been taking Lisinopril for at least several months and possibly a year. She does have some worsening kidney function which is most likely caused by  her poorly controlled diabetes. Benefits of kidney protection with taking Lisinopril in normotensive patients without microalbuminuria has no limited data for benefit so I will not restart today. - Patient will need urine microalbumin/creatnine ratio checked at next visit - Cont to monitor BP - Restart Lisinopril if on follow up visit she is hypertensive  Hyperlipidemia associated with type 2 diabetes mellitus (Amanda Shepherd) Patient with DM so should be on statin. Did not tolerate Atorvastatin 80mg  so will trial of Pravastatin 40mg . LDL checked was high at 138.  Uncontrolled type 2 diabetes mellitus with mild nonproliferative retinopathy, without long-term current use of insulin (Amanda Shepherd) Did have some improvement in A1c. She has been taking Metformin and [place]. Discussed the benefits she would have if we started Ozempic but patient declines. She did agree if her A1c is still above goal in 3 months she would be open to starting Ozepmic - F/u in 3 months for A1c check - If still above goal start Ozepmic 0.25mg  weekly for 4 weeks then increase to 0.5mg  weekly. - Need to repeat BMP at next check as her GFR is borderline for continuing Metformin and Amanda Shepherd, Amanda Shepherd

## 2020-04-02 NOTE — Patient Instructions (Signed)
It was great to see you today! Thank you for letting me participate in your care!  Today, we discussed your A1c and your blood sugar is not quite to goal. I am pleased you are continuing to exercise and please keep walking for 30 min per day 5 times per week. Please also limit sugary foods and foods high in carbohydrates. Stopping sodas will help.  I have stopped your Lisinopril. I have refilled your Metformin and your [place].  I will call you if any of your labs are abnormal.  Be well, Harolyn Rutherford, DO PGY-3, [place]

## 2020-04-03 LAB — BASIC METABOLIC PANEL
BUN/Creatinine Ratio: 21 (ref 12–28)
BUN: 28 mg/dL — ABNORMAL HIGH (ref 8–27)
CO2: 19 mmol/L — ABNORMAL LOW (ref 20–29)
Calcium: 9.9 mg/dL (ref 8.7–10.3)
Chloride: 103 mmol/L (ref 96–106)
Creatinine, Ser: 1.32 mg/dL — ABNORMAL HIGH (ref 0.57–1.00)
GFR calc Af Amer: 48 mL/min/{1.73_m2} — ABNORMAL LOW (ref >59)
GFR calc non Af Amer: 42 mL/min/{1.73_m2} — ABNORMAL LOW (ref >59)
Glucose: 110 mg/dL — ABNORMAL HIGH (ref 65–99)
Potassium: 4.4 mmol/L (ref 3.5–5.2)
Sodium: 138 mmol/L (ref 134–144)

## 2020-04-03 LAB — LIPID PANEL
Chol/HDL Ratio: 3.6 ratio (ref 0.0–4.4)
Cholesterol, Total: 212 mg/dL — ABNORMAL HIGH (ref 100–199)
HDL: 59 mg/dL (ref >39)
LDL Chol Calc (NIH): 138 mg/dL — ABNORMAL HIGH (ref 0–99)
Triglycerides: 83 mg/dL (ref 0–149)
VLDL Cholesterol Cal: 15 mg/dL (ref 5–40)

## 2020-04-03 MED ORDER — PRAVASTATIN SODIUM 40 MG PO TABS: 40.0000 mg | ORAL_TABLET | Freq: Every day | ORAL | 3 refills | Status: DC

## 2020-04-03 NOTE — Assessment & Plan Note (Signed)
Normotensive and has not been taking Lisinopril for at least several months and possibly a year. She does have some worsening kidney function which is most likely caused by her poorly controlled diabetes. Benefits of kidney protection with taking Lisinopril in normotensive patients without microalbuminuria has no limited data for benefit so I will not restart today. - Patient will need urine microalbumin/creatnine ratio checked at next visit - Cont to monitor BP - Restart Lisinopril if on follow up visit she is hypertensive

## 2020-04-03 NOTE — Assessment & Plan Note (Signed)
Patient with DM so should be on statin. Did not tolerate Atorvastatin 80mg  so will trial of Pravastatin 40mg . LDL checked was high at 138.

## 2020-04-03 NOTE — Assessment & Plan Note (Signed)
Did have some improvement in A1c. She has been taking Metformin and [place]. Discussed the benefits she would have if we started Ozempic but patient declines. She did agree if her A1c is still above goal in 3 months she would be open to starting Ozepmic - F/u in 3 months for A1c check - If still above goal start Ozepmic 0.25mg  weekly for 4 weeks then increase to 0.5mg  weekly. - Need to repeat BMP at next check as her GFR is borderline for continuing Metformin and [place]

## 2020-06-21 ENCOUNTER — Other Ambulatory Visit: Payer: Self-pay | Admitting: Family Medicine

## 2020-06-21 DIAGNOSIS — Z1231 Encounter for screening mammogram for malignant neoplasm of breast: Secondary | ICD-10-CM

## 2020-07-04 ENCOUNTER — Ambulatory Visit
Admission: RE | Admit: 2020-07-04 | Discharge: 2020-07-04 | Disposition: A | Discharge status: Home / Self Care | Payer: BC Managed Care – PPO | Source: Ambulatory Visit | Attending: Family Medicine | Admitting: Family Medicine

## 2020-07-04 DIAGNOSIS — Z1231 Encounter for screening mammogram for malignant neoplasm of breast: Secondary | ICD-10-CM

## 2020-10-17 ENCOUNTER — Other Ambulatory Visit: Payer: Self-pay | Admitting: Family Medicine

## 2020-10-17 DIAGNOSIS — IMO0002 Reserved for concepts with insufficient information to code with codable children: Secondary | ICD-10-CM

## 2020-10-23 ENCOUNTER — Other Ambulatory Visit: Payer: Self-pay | Admitting: Family Medicine

## 2020-10-23 DIAGNOSIS — E11319 Type 2 diabetes mellitus with unspecified diabetic retinopathy without macular edema: Secondary | ICD-10-CM

## 2020-10-23 DIAGNOSIS — IMO0002 Reserved for concepts with insufficient information to code with codable children: Secondary | ICD-10-CM

## 2020-12-12 ENCOUNTER — Ambulatory Visit (HOSPITAL_COMMUNITY)
Admission: EM | Admit: 2020-12-12 | Discharge: 2020-12-12 | Disposition: A | Discharge status: Home / Self Care | Payer: Medicare Other | Attending: Medical Oncology | Admitting: Medical Oncology

## 2020-12-12 ENCOUNTER — Encounter (HOSPITAL_COMMUNITY): Payer: Self-pay

## 2020-12-12 ENCOUNTER — Other Ambulatory Visit: Payer: Self-pay

## 2020-12-12 DIAGNOSIS — H04301 Unspecified dacryocystitis of right lacrimal passage: Secondary | ICD-10-CM

## 2020-12-12 MED ORDER — BACITRACIN-POLYMYXIN B 500-10000 UNIT/GM OP OINT: 1.0000 "application " | TOPICAL_OINTMENT | Freq: Two times a day (BID) | OPHTHALMIC | 0 refills | Status: AC

## 2020-12-12 NOTE — ED Provider Notes (Signed)
Amanda Shepherd    CSN: [id number] Arrival date & time: 12/12/20  1115      History   Chief Complaint Chief Complaint  Patient presents with  . eyelid swelling    HPI Amanda Shepherd is a 70 y.o. female.   HPI  Eyelid swelling: Pt reports that for the past 2 weeks she has had swelling of her right upper eye lid. The eyelid is itchy but is not painful in nature. She has been using warm compresses and stye relief cream with little improvement. She denies eye injury, vision changes, discharge from the eye, contact use or new cosmetics/products.    Past Medical History:  Diagnosis Date  . Bronchitis   . Diabetes mellitus   . Hyperlipidemia     Patient Active Problem List   Diagnosis Date Noted  . COVID-19 virus infection 04/01/2019  . Nonproductive cough 03/25/2018  . Mild non proliferative diabetic retinopathy ([place]) 06/04/2014  . Cough in adult 10/27/2013  . Primary hypertension 09/22/2010  . Uncontrolled type 2 diabetes mellitus with mild nonproliferative retinopathy, without long-term current use of insulin ([place]) 02/14/2007  . Hyperlipidemia associated with type 2 diabetes mellitus ([place]) 02/14/2007    Past Surgical History:  Procedure Laterality Date  . ABDOMINAL HYSTERECTOMY      OB History   No obstetric history on file.      Home Medications    Prior to Admission medications   Medication Sig Start Date End Date Taking? Authorizing Provider  bacitracin-polymyxin b (POLYSPORIN) ophthalmic ointment Place 1 application into the right eye every 12 (twelve) hours for 5 days. apply to eye every 12 hours while awake 12/12/20 12/17/20 Yes Nickolaos Brallier M, PA-C  acetaminophen (TYLENOL) 325 MG tablet Take 650 mg by mouth at bedtime as needed for fever.    [provider]  albuterol (VENTOLIN HFA) 108 (90 Base) MCG/ACT inhaler Inhale 1-2 puffs into the lungs every 6 (six) hours as needed for wheezing or shortness of breath. 03/28/19   Bast, Tressia Miners A, NP   benzonatate (TESSALON) 100 MG capsule Take 1 capsule (100 mg total) by mouth every 8 (eight) hours. 03/28/19   Loura Halt A, NP  blood glucose meter kit and supplies Dispense Glucometer + testing supplies QAC-HS based on patient and insurance preference. Use up to four times daily as directed. (FOR ICD-9 250.00, 250.01). 1 month supply. 04/04/19   Thurnell Lose, MD  Blood Glucose Monitoring Suppl (ONE TOUCH ULTRA 2) w/Device KIT 1 Device by Does not apply route 2 (two) times daily. 05/31/17   Harriet Butte, DO  cetirizine (ZYRTEC) 10 MG tablet Take 1 tablet (10 mg total) by mouth daily. 03/28/19   Loura Halt A, NP  dapagliflozin propanediol (FARXIGA) 10 MG TABS tablet Take 1 tablet (10 mg total) by mouth daily. 04/02/20   Lockamy, Christia [place], DO  fluticasone (FLONASE) 50 MCG/ACT nasal spray Place 1 spray into both nostrils daily. 03/28/19   Loura Halt A, NP  glucose blood (ONE TOUCH ULTRA TEST) test strip Check sugar 2 x daily 05/31/17   Harriet Butte, DO  guaiFENesin (ROBITUSSIN) 100 MG/5ML liquid Take 200 mg by mouth 3 (three) times daily as needed for cough.    [provider]  insulin aspart (NOVOLOG) 100 UNIT/ML injection Before each meal 3 times a day , 140-199 - 2 units, 200-250 - 4 units, 251-299 - 6 units,  300-349 - 8 units,  350 or above 10 units. Insulin PEN if approved, provide syringes and  needles if needed. Can switch to any other cheaper alternative. 04/04/19   Thurnell Lose, MD  Insulin Pen Needle (BD PEN NEEDLE NANO U/F) 32G X 4 MM MISC Use 1 pen needle with each insulin dose once a day 06/10/17   Zenia Resides, MD  Insulin Syringe-Needle U-100 25G X 1" 1 ML MISC For 4 times a day insulin SQ, 1 month supply. Diagnosis E11.65 04/04/19   Thurnell Lose, MD  metFORMIN (GLUCOPHAGE) 1000 MG tablet Take 1 tablet by mouth twice daily. Please call the office to schedule an appointment ASAP. No additional refills will be provided until you are seen in the office. 10/21/20   [name], MD  [facility] DELICA LANCETS 01B MISC 1 Device by Does not apply route 2 (two) times daily. 01/18/18   Harriet Butte, DO  pravastatin (PRAVACHOL) 40 MG tablet Take 1 tablet (40 mg total) by mouth at bedtime. 04/03/20   Nuala Alpha, DO    Family History Family History  Problem Relation Age of Onset  . Hypertension Mother     Social History Social History   Tobacco Use  . Smoking status: Never Smoker  . Smokeless tobacco: Never Used  Substance Use Topics  . Alcohol use: No  . Drug use: No     Allergies   Atorvastatin   Review of Systems Review of Systems  As stated above in HPI Physical Exam Triage Vital Signs ED Triage Vitals  Enc Vitals Group     BP 12/12/20 1150 (!) 168/93     Pulse Rate 12/12/20 1150 86     Resp 12/12/20 1150 (!) 97     Temp 12/12/20 1150 98.3 F (36.8 C)     Temp src --      SpO2 12/12/20 1150 97 %     Weight --      Height --      Head Circumference --      Peak Flow --      Pain Score 12/12/20 1147 0     Pain Loc --      Pain Edu? --      Excl. in [place]? --    No data found.  Updated Vital Signs BP (!) 168/93   Pulse 86   Temp 98.3 F (36.8 C)   Resp 19   SpO2 97%   Physical Exam Vitals and nursing note reviewed.  Constitutional:      General: She is not in acute distress.    Appearance: Normal appearance. She is not ill-appearing, toxic-appearing or diaphoretic.  Eyes:     General:        Right eye: No discharge.        Left eye: No discharge.     Extraocular Movements: Extraocular movements intact.     Pupils: Pupils are equal, round, and reactive to light.     Comments: Right superior eyelid with a roughly 3 mm palpable non-tender nodule. Mild to moderate surrounding erythema and edema of the immediate area. Small palpable pustule of internal eye lid below the area of concern. No evidence of foreign body. No other swelling noted.   Neurological:     Mental Status: She is alert.      UC Treatments /  Results  Labs (all labs ordered are listed, but only abnormal results are displayed) Labs Reviewed - No data to display  EKG   Radiology No results found.  Procedures Procedures (including critical care time)  Medications Ordered in  UC Medications - No data to display  Initial Impression / Assessment and Plan / UC Course  I have reviewed the triage vital signs and the nursing notes.  Pertinent labs & imaging results that were available during my care of the patient were reviewed by me and considered in my medical decision making (see chart for details).     New. Treating for infected tear duct vs stye with polymixin. Discussed to continue warm compresses but avoid itching the eye. Discussed common course of improvement along with red flag signs and symptoms. Follow up as needed.   Final Clinical Impressions(s) / UC Diagnoses   Final diagnoses:  Infection of right tear duct   Discharge Instructions   None    ED Prescriptions    Medication Sig Dispense Auth. Provider   bacitracin-polymyxin b (POLYSPORIN) ophthalmic ointment Place 1 application into the right eye every 12 (twelve) hours for 5 days. apply to eye every 12 hours while awake 3.5 g Evie Croston M, [place]     PDMP not reviewed this encounter.   Hughie Closs, [place] 12/12/20 1231

## 2020-12-12 NOTE — ED Triage Notes (Addendum)
Pt in with c/o right eyelid swelling that she noticed about 2 weeks ago.  Pt states that her eyelid is itchy, but not painful  Pt has been applying warm compresses and stye cream for relief  Pt denies any vision changes

## 2021-01-08 ENCOUNTER — Telehealth: Payer: Self-pay | Admitting: Family Medicine

## 2021-01-08 NOTE — Telephone Encounter (Signed)
Called patient to scheduled AWV, please assist with scheduling if patient calls back. Any questions please ask. Thanks!

## 2021-01-30 ENCOUNTER — Other Ambulatory Visit: Payer: Self-pay

## 2021-01-30 DIAGNOSIS — IMO0002 Reserved for concepts with insufficient information to code with codable children: Secondary | ICD-10-CM

## 2021-01-30 DIAGNOSIS — E11319 Type 2 diabetes mellitus with unspecified diabetic retinopathy without macular edema: Secondary | ICD-10-CM

## 2021-02-03 NOTE — Progress Notes (Signed)
    SUBJECTIVE:   Chief compliant/HPI: annual examination  Amanda Shepherd is a 70 y.o. who presents today for an annual exam.   History tabs reviewed and updated.  Diabetes Home medications include: Metformin 1052m BID and Farxiga 144mdaily. [name] has not been taking these medications for past 2 months, although she recently re-started her FaIranPatient does not check blood glucose on a regular basis.  Most recent A1Cs:  Lab Results  Component Value Date   HGBA1C 9.4 (A) 02/04/2021   HGBA1C 8.3 (A) 04/02/2020   HGBA1C 10.2 (H) 04/02/2019   Last Microalbumin, LDL, Creatinine: Lab Results  Component Value Date   LDLCALC 138 (H) 04/02/2020   CREATININE 1.32 (H) 04/02/2020   Patient states she's up to date on diabetic eye exam, although I'm unable to verify this in her chart.  Patient is up to date on diabetic foot exam.   OBJECTIVE:   BP 112/60   Pulse 90   Ht _0  (1.753 m)   Wt 135 lb (61.2 kg)   SpO2 99%   BMI 19.94 kg/m   Gen: alert, well-appearing, NAD HEENT: Amanda Shepherd/AT, PERRL, EOMI, oropharynx normal Neck: supple, no cervical lymphadenopathy, thyroid normal in size without palpable nodules CV: RRR, normal S1/S2 without m/r/g Resp: normal WOB, lungs CTA Abd: soft, nontender Neuro: grossly intact, normal gait  ASSESSMENT/PLAN:   Uncontrolled type 2 diabetes mellitus with mild nonproliferative retinopathy, without long-term current use of insulin (HCC) Poorly controlled. A1c 9.4% today. Patient has not been taking her Metformin or Farxiga over the past 3 months because she ran out. Was on Insulin briefly in the past (several years ago) but was reportedly told to stop due to hypoglycemia. -Check BMP today -Continue Metformin 100036mID and Farxiga 50m55mily for now. May need renal adjustment pending BMP results. -Needs statin therapy. Reports she couldn't tolerate atorvastatin or pravastatin. Will discuss in further detail at next appt. -F/u with pharmacy  team for additional management   Annual Examination  PHQ score 0, reviewed and discussed.  BP reviewed and at goal.  Advance directives discussion- deferred due to time constraints. Will discuss at future visits.  Considered the following items based upon USPSTF recommendations: HIV testing: completed 04/01/2019 (non-reactive), no new risk factors Hepatitis C: completed 06/07/2017 Syphilis if at high risk: not at high risk GC/CT not at high risk and not ordered. Reviewed risk factors for latent tuberculosis and not indicated  Discussed family history, BRCA testing not indicated.  Cervical cancer screening: not indicated given history of hysterectomy with prior normal cytology.  Breast cancer screening: UTD. Mammogram normal in Sept 2021 Colorectal cancer screening: discussed, colonoscopy ordered Lung cancer screening: not indicated.  Will plan to discuss advanced directives and DEXA scan at future visit due to time constraints.  Follow up in 1 month   AshlAlcus Dad [place]

## 2021-02-04 ENCOUNTER — Other Ambulatory Visit: Payer: Self-pay

## 2021-02-04 ENCOUNTER — Ambulatory Visit (INDEPENDENT_AMBULATORY_CARE_PROVIDER_SITE_OTHER): Payer: Medicare Other | Admitting: Family Medicine

## 2021-02-04 ENCOUNTER — Encounter: Payer: Self-pay | Admitting: Family Medicine

## 2021-02-04 VITALS — BP 112/60 | HR 90 | Ht 69.0 in | Wt 135.0 lb

## 2021-02-04 DIAGNOSIS — Z1211 Encounter for screening for malignant neoplasm of colon: Secondary | ICD-10-CM

## 2021-02-04 DIAGNOSIS — E1165 Type 2 diabetes mellitus with hyperglycemia: Secondary | ICD-10-CM

## 2021-02-04 DIAGNOSIS — Z0000 Encounter for general adult medical examination without abnormal findings: Secondary | ICD-10-CM | POA: Diagnosis not present

## 2021-02-04 DIAGNOSIS — IMO0002 Reserved for concepts with insufficient information to code with codable children: Secondary | ICD-10-CM

## 2021-02-04 DIAGNOSIS — E113299 Type 2 diabetes mellitus with mild nonproliferative diabetic retinopathy without macular edema, unspecified eye: Secondary | ICD-10-CM | POA: Diagnosis not present

## 2021-02-04 LAB — POCT GLYCOSYLATED HEMOGLOBIN (HGB A1C): HbA1c, POC (controlled diabetic range): 9.4 % — AB (ref 0.0–7.0)

## 2021-02-04 MED ORDER — BLOOD GLUCOSE METER KIT
PACK | 0 refills | Status: DC
Start: 2021-02-04 — End: 2022-03-17

## 2021-02-04 MED ORDER — METFORMIN HCL 1000 MG PO TABS: ORAL_TABLET | ORAL | 0 refills | Status: DC

## 2021-02-04 MED ORDER — GLUCOSE BLOOD VI STRP: ORAL_STRIP | 11 refills | Status: DC

## 2021-02-04 NOTE — Assessment & Plan Note (Signed)
Poorly controlled. A1c 9.4% today. Patient has not been taking her Metformin or Farxiga over the past 3 months because she ran out. Was on Insulin briefly in the past (several years ago) but was reportedly told to stop due to hypoglycemia. -Check BMP today -Continue Metformin 1000mg  BID and Farxiga 10mg  daily for now. May need renal adjustment pending BMP results. -Needs statin therapy. Reports she couldn't tolerate atorvastatin or pravastatin. Will discuss in further detail at next appt. -F/u with pharmacy team for additional management

## 2021-02-04 NOTE — Addendum Note (Signed)
Addended by: Owens Shark, Alee Katen on: 02/04/2021 09:13 PM   Modules accepted: Level of Service

## 2021-02-04 NOTE — Patient Instructions (Addendum)
It was great to meet you!  - We are checking some labs today. I will call you if the results are abnormal and any changes are needed. - I have sent refills of Metformin to your pharmacy. - I have also sent a glucometer and testing supplies so you can check your blood sugar. - I have placed a referral to GI. They should call you to set up a colonoscopy (screening for colon cancer).   Please bring ALL medications to your next appointment.   Take care and seek immediate care sooner if you develop any concerns.   Dr. [facility]

## 2021-02-05 LAB — BASIC METABOLIC PANEL
BUN/Creatinine Ratio: 16 (ref 12–28)
BUN: 24 mg/dL (ref 8–27)
CO2: 20 mmol/L (ref 20–29)
Calcium: 9.8 mg/dL (ref 8.7–10.3)
Chloride: 103 mmol/L (ref 96–106)
Creatinine, Ser: 1.49 mg/dL — ABNORMAL HIGH (ref 0.57–1.00)
Glucose: 174 mg/dL — ABNORMAL HIGH (ref 65–99)
Potassium: 4.1 mmol/L (ref 3.5–5.2)
Sodium: 142 mmol/L (ref 134–144)
eGFR: 38 mL/min/{1.73_m2} — ABNORMAL LOW (ref >59)

## 2021-02-05 MED ORDER — METFORMIN HCL ER 500 MG PO TB24: 500.0000 mg | ORAL_TABLET | Freq: Every day | ORAL | 1 refills | Status: DC

## 2021-02-05 NOTE — Addendum Note (Signed)
Addended by: [name] on: 02/05/2021 01:47 PM   Modules accepted: Orders

## 2021-02-05 NOTE — Telephone Encounter (Signed)
Called patient to discuss lab results from yesterday's visit. Kidney function slightly worse from prior (GFR 38). Therefore, Metformin dose reduced to 500mg  daily. Patient verbalized understanding. She has follow-up with me and with pharmacy team in 1 month.

## 2021-03-03 ENCOUNTER — Encounter: Payer: Self-pay | Admitting: Family Medicine

## 2021-03-03 ENCOUNTER — Ambulatory Visit: Payer: Medicare Other | Admitting: Pharmacist

## 2021-03-03 ENCOUNTER — Ambulatory Visit (INDEPENDENT_AMBULATORY_CARE_PROVIDER_SITE_OTHER): Payer: Medicare Other | Admitting: Family Medicine

## 2021-03-03 ENCOUNTER — Other Ambulatory Visit: Payer: Self-pay

## 2021-03-03 VITALS — BP 130/70 | HR 86 | Wt 130.0 lb

## 2021-03-03 DIAGNOSIS — E1165 Type 2 diabetes mellitus with hyperglycemia: Secondary | ICD-10-CM

## 2021-03-03 DIAGNOSIS — N183 Chronic kidney disease, stage 3 unspecified: Secondary | ICD-10-CM | POA: Insufficient documentation

## 2021-03-03 DIAGNOSIS — IMO0002 Reserved for concepts with insufficient information to code with codable children: Secondary | ICD-10-CM

## 2021-03-03 DIAGNOSIS — N1832 Chronic kidney disease, stage 3b: Secondary | ICD-10-CM

## 2021-03-03 DIAGNOSIS — E113299 Type 2 diabetes mellitus with mild nonproliferative diabetic retinopathy without macular edema, unspecified eye: Secondary | ICD-10-CM

## 2021-03-03 MED ORDER — DAPAGLIFLOZIN PROPANEDIOL 10 MG PO TABS: 10.0000 mg | ORAL_TABLET | Freq: Every day | ORAL | 1 refills | Status: DC

## 2021-03-03 MED ORDER — ROSUVASTATIN CALCIUM 5 MG PO TABS: 5.0000 mg | ORAL_TABLET | Freq: Every day | ORAL | 0 refills | Status: DC

## 2021-03-03 MED ORDER — METFORMIN HCL ER 500 MG PO TB24: 500.0000 mg | ORAL_TABLET | Freq: Every day | ORAL | 2 refills | Status: DC

## 2021-03-03 NOTE — Progress Notes (Signed)
    SUBJECTIVE:   CHIEF COMPLAINT / HPI:   Diabetes Follow-Up Patient presents for follow-up on her diabetes. Last seen 1 month ago, at which time she had been off her meds due to "running out" and her A1c was 9.4%. She was restarted on Metformin 500mg  daily and Farxiga 10mg  daily at that visit. Today: -Reports good compliance with meds, no side effects noted -Checks sugars regularly, typically ranges 160s-170s, never in the 200s -No hypoglycemia or hypoglycemic symptoms -Endorses poor dietary compliance (drinks soda regularly) -States she is trying to eat better, but is not sure what she should/shouldn't eat with regard to her diabetes -UTD on foot exam. Overdue to eye exam.  PERTINENT  PMH / PSH:  DM, HTN (not on meds), CKD stage 3B  OBJECTIVE:   BP 130/70   Pulse 86   Wt 130 lb (59 kg)   SpO2 98%   BMI 19.20 kg/m   Gen: alert, well-appearing, NAD CV: RRR, normal S1/S2 Resp: normal WOB on room air Neuro: grossly intact, normal gait Psych: appropriate mood and affect  ASSESSMENT/PLAN:   Uncontrolled type 2 diabetes mellitus with mild nonproliferative retinopathy, without long-term current use of insulin (HCC) Last A1c 9.4% on 02/04/21. BMP also obtained at that time. -Continue current medications (Metformin 500mg  daily, Farxiga 10mg  daily). Refills provided today. -Start Rosuvastatin 5mg  daily (starting at lowest dose as patient had difficulty tolerating statin in the past). -Advised to schedule eye exam -Provided handout on diabetes nutrition -Patient to keep written log of her sugars -Follow-up with Pharmacy team in 2-4 weeks -Plan to recheck A1c at next visit     [name], MD [place]

## 2021-03-03 NOTE — Patient Instructions (Addendum)
It was great to see you!  Things we discussed at today's visit: - Keep taking your Metformin (500mg  once daily) and Farxiga (10mg  once daily). - Please keep a written log of your sugars. - Bring ALL your medications and your blood sugar log to your next appointment. - We are starting a medication for your cholesterol called Crestor. Take it once daily.  Take care and seek immediate care sooner if you develop any concerns.  Dr. [facility]   Diabetes Mellitus and Nutrition, Adult When you have diabetes, or diabetes mellitus, it is very important to have healthy eating habits because your blood sugar (glucose) levels are greatly affected by what you eat and drink. Eating healthy foods in the right amounts, at about the same times every day, can help you:  Control your blood glucose.  Lower your risk of heart disease.  Improve your blood pressure.  Reach or maintain a healthy weight. How do carbohydrates affect me? Carbohydrates, also called carbs, affect your blood glucose level more than any other type of food. Eating carbs naturally raises the amount of glucose in your blood. Carb counting is a method for keeping track of how many carbs you eat. Counting carbs is important to keep your blood glucose at a healthy level, especially if you use insulin or take certain oral diabetes medicines. It is important to know how many carbs you can safely have in each meal. This is different for every person. Your dietitian can help you calculate how many carbs you should have at each meal and for each snack. How does alcohol affect me? Alcohol can cause a sudden decrease in blood glucose (hypoglycemia), especially if you use insulin or take certain oral diabetes medicines. Hypoglycemia can be a life-threatening condition. Symptoms of hypoglycemia, such as sleepiness, dizziness, and confusion, are similar to symptoms of having too much alcohol.  Do not drink alcohol if: ? Your health  care provider tells you not to drink. ? You are pregnant, may be pregnant, or are planning to become pregnant.  If you drink alcohol: ? Do not drink on an empty stomach. ? Limit how much you use to:  0-1 drink a day for women.  0-2 drinks a day for men. ? Be aware of how much alcohol is in your drink. In the [place] one drink equals one 12 oz bottle of beer (355 mL), one 5 oz glass of wine (148 mL), or one 1 oz glass of hard liquor (44 mL). ? Keep yourself hydrated with water, diet soda, or unsweetened iced tea.  Keep in mind that regular soda, juice, and other mixers may contain a lot of sugar and must be counted as carbs. What are tips for following this plan? Reading food labels  Start by checking the serving size on the "Nutrition Facts" label of packaged foods and drinks. The amount of calories, carbs, fats, and other nutrients listed on the label is based on one serving of the item. Many items contain more than one serving per package.  Check the total grams (g) of carbs in one serving. You can calculate the number of servings of carbs in one serving by dividing the total carbs by 15. For example, if a food has 30 g of total carbs per serving, it would be equal to 2 servings of carbs.  Check the number of grams (g) of saturated fats and trans fats in one serving. Choose foods that have a low amount or none of these  fats.  Check the number of milligrams (mg) of salt (sodium) in one serving. Most people should limit total sodium intake to less than 2,300 mg per day.  Always check the nutrition information of foods labeled as "low-fat" or "nonfat." These foods may be higher in added sugar or refined carbs and should be avoided.  Talk to your dietitian to identify your daily goals for nutrients listed on the label. Shopping  Avoid buying canned, pre-made, or processed foods. These foods tend to be high in fat, sodium, and added sugar.  Shop around the outside edge of the grocery  store. This is where you will most often find fresh fruits and vegetables, bulk grains, fresh meats, and fresh dairy. Cooking  Use low-heat cooking methods, such as baking, instead of high-heat cooking methods like deep frying.  Cook using healthy oils, such as olive, canola, or sunflower oil.  Avoid cooking with butter, cream, or high-fat meats. Meal planning  Eat meals and snacks regularly, preferably at the same times every day. Avoid going long periods of time without eating.  Eat foods that are high in fiber, such as fresh fruits, vegetables, beans, and whole grains. Talk with your dietitian about how many servings of carbs you can eat at each meal.  Eat 4-6 oz (112-168 g) of lean protein each day, such as lean meat, chicken, fish, eggs, or tofu. One ounce (oz) of lean protein is equal to: ? 1 oz (28 g) of meat, chicken, or fish. ? 1 egg. ?  cup (62 g) of tofu.  Eat some foods each day that contain healthy fats, such as avocado, nuts, seeds, and fish.   What foods should I eat? Fruits Berries. Apples. Oranges. Peaches. Apricots. Plums. Grapes. Mango. Papaya. Pomegranate. Kiwi. Cherries. Vegetables Lettuce. Spinach. Leafy greens, including kale, chard, collard greens, and mustard greens. Beets. Cauliflower. Cabbage. Broccoli. Carrots. Green beans. Tomatoes. Peppers. Onions. Cucumbers. Brussels sprouts. Grains Whole grains, such as whole-wheat or whole-grain bread, crackers, tortillas, cereal, and pasta. Unsweetened oatmeal. Quinoa. Brown or wild rice. Meats and other proteins Seafood. Poultry without skin. Lean cuts of poultry and beef. Tofu. Nuts. Seeds. Dairy Low-fat or fat-free dairy products such as milk, yogurt, and cheese. The items listed above may not be a complete list of foods and beverages you can eat. Contact a dietitian for more information. What foods should I avoid? Fruits Fruits canned with syrup. Vegetables Canned vegetables. Frozen vegetables with butter or  cream sauce. Grains Refined white flour and flour products such as bread, pasta, snack foods, and cereals. Avoid all processed foods. Meats and other proteins Fatty cuts of meat. Poultry with skin. Breaded or fried meats. Processed meat. Avoid saturated fats. Dairy Full-fat yogurt, cheese, or milk. Beverages Sweetened drinks, such as soda or iced tea. The items listed above may not be a complete list of foods and beverages you should avoid. Contact a dietitian for more information.  Where to find more information:  American Diabetes Association: diabetes.org  Academy of Nutrition and Dietetics: www.eatright.[employer] of Diabetes and Digestive and Kidney Diseases: [employer]  Association of Diabetes Care and Education Specialists: www.diabeteseducator.org Summary  It is important to have healthy eating habits because your blood sugar (glucose) levels are greatly affected by what you eat and drink.  A healthy meal plan will help you control your blood glucose and maintain a healthy lifestyle.  Your health care provider may recommend that you work with a dietitian to make a meal plan that is  best for you.  Keep in mind that carbohydrates (carbs) and alcohol have immediate effects on your blood glucose levels. It is important to count carbs and to use alcohol carefully. This information is not intended to replace advice given to you by your health care provider. Make sure you discuss any questions you have with your health care provider. Document Revised: 09/12/2019 Document Reviewed: 09/12/2019 Elsevier Patient Education  2021 [employer].

## 2021-03-03 NOTE — Assessment & Plan Note (Signed)
Last A1c 9.4% on 02/04/21. BMP also obtained at that time. -Continue current medications (Metformin 500mg  daily, Farxiga 10mg  daily). Refills provided today. -Start Rosuvastatin 5mg  daily (starting at lowest dose as patient had difficulty tolerating statin in the past). -Advised to schedule eye exam -Provided handout on diabetes nutrition -Patient to keep written log of her sugars -Follow-up with Pharmacy team in 2-4 weeks -Plan to recheck A1c at next visit

## 2021-03-24 ENCOUNTER — Ambulatory Visit (INDEPENDENT_AMBULATORY_CARE_PROVIDER_SITE_OTHER): Payer: Medicare Other | Admitting: Pharmacist

## 2021-03-24 ENCOUNTER — Other Ambulatory Visit: Payer: Self-pay

## 2021-03-24 DIAGNOSIS — E1165 Type 2 diabetes mellitus with hyperglycemia: Secondary | ICD-10-CM | POA: Diagnosis not present

## 2021-03-24 DIAGNOSIS — I10 Essential (primary) hypertension: Secondary | ICD-10-CM | POA: Diagnosis not present

## 2021-03-24 DIAGNOSIS — IMO0002 Reserved for concepts with insufficient information to code with codable children: Secondary | ICD-10-CM

## 2021-03-24 DIAGNOSIS — E113299 Type 2 diabetes mellitus with mild nonproliferative diabetic retinopathy without macular edema, unspecified eye: Secondary | ICD-10-CM

## 2021-03-24 NOTE — Assessment & Plan Note (Signed)
T2DM is not controlled likely due to suboptimal dietary habits and patient previously running out of medication. Medication adherence appears optimal. Additional pharmacotherapy is not warranted at this time. Patient requested to work on dietary changes for a month before discussing further therapy. Following instruction patient verbalized understanding of treatment plan.    1. Continued SGLT2-I dapagliflozin Wilder Glade) 10mg   2. Extensively discussed pathophysiology of diabetes, dietary effects on blood sugar control, and recommended lifestyle interventions. 3. Patient will adhere to dietary modifications - provided patient ADA's Diabetes Plate Method handout 4. Counseled on s/sx of and management of hypoglycemia 5. Next A1C anticipated July 2022.

## 2021-03-24 NOTE — Assessment & Plan Note (Signed)
Hypertension - Patient previously normotensive and no longer on antihypertensive medications but presented with elevated blood pressure. Patient reports she checks her blood pressure at home and it is always within goal of <130/80.  1. Check blood pressure at follow-up appt

## 2021-03-24 NOTE — Patient Instructions (Signed)
Ms. Yohe it was a pleasure seeing you today.   Please do the following:  1. Continue your medications as directed today during your appointment. If you have any questions or if you believe something has occurred because of this change, call me or your doctor to let one of [place] know.  2. Continue checking blood sugars at home. It's really important that you record these and bring these in to your next doctor's appointment.  3. Continue making the lifestyle changes we've discussed together during our visit. Diet and exercise play a significant role in improving your blood sugars.  4. Follow-up with me in four weeks.   Hypoglycemia or low blood sugar:   Low blood sugar can happen quickly and may become an emergency if not treated right away.   While this shouldn't happen often, it can be brought upon if you skip a meal or do not eat enough. Also, if your insulin or other diabetes medications are dosed too high, this can cause your blood sugar to go to low.   Warning signs of low blood sugar include: 1. Feeling shaky or dizzy 2. Feeling weak or tired  3. Excessive hunger 4. Feeling anxious or upset  5. Sweating even when you aren't exercising  What to do if I experience low blood sugar? Follow the Rule of 15 1. Check your blood sugar with your meter. If lower than 70, proceed to step 2.  2. Treat with 15 grams of fast acting carbs which is found in 3-4 glucose tablets. If none are available you can try hard candy, 1 tablespoon of sugar or honey,4 ounces of fruit juice, or 6 ounces of REGULAR soda.  3. Re-check your sugar in 15 minutes. If it is still below 70, do what you did in step 2 again. If your blood sugar has come back up, go ahead and eat a snack or small meal made up of complex carbs (ex. Whole grains) and protein at this time to avoid recurrence of low blood sugar.

## 2021-03-24 NOTE — Progress Notes (Signed)
Subjective:    Patient ID: Amanda Shepherd, female    DOB: May 05, 1951, 70 y.o.   MRN: [id number]  HPI Patient is a 70 y.o. female who presents for diabetes management. She is in good spirits and presents without assistance. Patient was referred on and last seen by Primary Care Provider on 03/03/21.  Patient reports diabetes was diagnosed about 10-20 years ago.   Insurance coverage/medication affordability: Medicare  Current diabetes medications include: metformin XR 500mg , farxiga 10mg   Current hypertension medications include: n/a Current hyperlipidemia medications include: rosuvastatin 5mg  Patient states that She is taking her diabetes medications as prescribed. Patient reports adherence with medications. Patient states that She does not miss doses of her medications.  Patient reports feeling like her diet is what is contributing to her elevated blood glucose and A1C. She reports that when she stopped drinking sodas her blood glucose was well controlled, but since drinking sodas again her sugars have gone up. She reports drinking regular coke 2x/week (can or bottle). She also endorses eating fast food throughout the week when she is "running around." She would like to work on improving her diet to help her better manage her diabetes.  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  Home fasting blood sugars: mid-high 100's  Objective:   Labs:   Lab Results  Component Value Date   HGBA1C 9.4 (A) 02/04/2021   HGBA1C 8.3 (A) 04/02/2020   HGBA1C 10.2 (H) 04/02/2019    Vitals:   03/24/21 1131  BP: 140/88  Weight: 131 lb 9.6 oz (59.7 kg)    Lipid Panel     Component Value Date/Time   CHOL 212 (H) 04/02/2020 1507   TRIG 83 04/02/2020 1507   HDL 59 04/02/2020 1507   CHOLHDL 3.6 04/02/2020 1507   CHOLHDL 3.4 Ratio 11/18/2010 2037   CHOLHDL 3.4 Ratio 11/18/2010 2037   VLDL 12 11/18/2010  2037   VLDL 12 11/18/2010 2037   LDLCALC 138 (H) 04/02/2020 1507   LDLDIRECT 135 (H) 03/06/2013 1608    Clinical Atherosclerotic Cardiovascular Disease (ASCVD): No  The 10-year ASCVD risk score Mikey Bussing DC Jr., et al., 2013) is: 27.7%   Values used to calculate the score:     Age: 10 years     Sex: Female     Is Non-Hispanic African American: Yes     Diabetic: Yes     Tobacco smoker: No     Systolic Blood Pressure: 035 mmHg     Is BP treated: No     HDL Cholesterol: 59 mg/dL     Total Cholesterol: 212 mg/dL    Assessment/Plan:   T2DM is not controlled likely due to suboptimal dietary habits and patient previously running out of medication. Medication adherence appears optimal. Additional pharmacotherapy is not warranted at this time. Patient requested to work on dietary changes for a month before discussing further therapy. Following instruction patient verbalized understanding of treatment plan.    1. Continued SGLT2-I dapagliflozin Wilder Glade) 10mg   2. Extensively discussed pathophysiology of diabetes, dietary effects on blood sugar control, and recommended lifestyle interventions. 3. Patient will adhere to dietary modifications - provided patient ADA's Diabetes Plate Method handout 4. Counseled on s/sx of and management of hypoglycemia 5. Next A1C anticipated July 2022.   ASCVD risk - primary prevention in patient with diabetes. Last LDL is not controlled. ASCVD risk score is >20%  - high intensity statin indicated.Patient initiated  on 03/03/21. Has history of intolerances to statin therapy and reports currently tolerating Aspirin is not indicated.   1. Continued rosuvastatin 5 mg  Hypertension - Patient previously normotensive and no longer on antihypertensive medications but presented with elevated blood pressure. Patient reports she checks her blood pressure at home and it is always within goal of <130/80.  1. Check blood pressure at follow-up appt   Follow-up appointment 4  weeks to review sugar readings. Written patient instructions provided.  This appointment required 60 minutes of patient care (this includes precharting, chart review, review of results, and face-to-face care).  Thank you for involving pharmacy to assist in providing this patient's care.  Patient seen with Caren Macadam PharmD Candidate, and Romilda Garret, PharmD - PGY-1 Resident.

## 2021-03-28 ENCOUNTER — Other Ambulatory Visit: Payer: Self-pay | Admitting: Family Medicine

## 2021-03-28 DIAGNOSIS — IMO0002 Reserved for concepts with insufficient information to code with codable children: Secondary | ICD-10-CM

## 2021-04-23 ENCOUNTER — Ambulatory Visit: Payer: BC Managed Care – PPO | Admitting: Pharmacist

## 2021-04-29 ENCOUNTER — Ambulatory Visit (INDEPENDENT_AMBULATORY_CARE_PROVIDER_SITE_OTHER): Payer: Medicare Other | Admitting: Pharmacist

## 2021-04-29 ENCOUNTER — Other Ambulatory Visit: Payer: Self-pay

## 2021-04-29 DIAGNOSIS — E785 Hyperlipidemia, unspecified: Secondary | ICD-10-CM | POA: Diagnosis not present

## 2021-04-29 DIAGNOSIS — E1165 Type 2 diabetes mellitus with hyperglycemia: Secondary | ICD-10-CM | POA: Diagnosis not present

## 2021-04-29 DIAGNOSIS — E113299 Type 2 diabetes mellitus with mild nonproliferative diabetic retinopathy without macular edema, unspecified eye: Secondary | ICD-10-CM | POA: Diagnosis not present

## 2021-04-29 DIAGNOSIS — IMO0002 Reserved for concepts with insufficient information to code with codable children: Secondary | ICD-10-CM

## 2021-04-29 DIAGNOSIS — E1169 Type 2 diabetes mellitus with other specified complication: Secondary | ICD-10-CM | POA: Diagnosis not present

## 2021-04-29 NOTE — Patient Instructions (Signed)
Ms. Inscore it was a pleasure seeing you today.   Please do the following:  Continue medications as directed today during your appointment. If you have any questions or if you believe something has occurred because of this change, call me or your doctor to let one of [place] know.  Continue checking blood sugars at home. It's really important that you record these and bring these in to your next doctor's appointment.  Continue making the lifestyle changes we've discussed together during our visit. Diet and exercise play a significant role in improving your blood sugars. Try the sparkling ICE water beverage. Follow-up with me on August 2nd. We will obtain a new A1C and lipid panel at next visit   Hypoglycemia or low blood sugar:   Low blood sugar can happen quickly and may become an emergency if not treated right away.   While this shouldn't happen often, it can be brought upon if you skip a meal or do not eat enough. Also, if your insulin or other diabetes medications are dosed too high, this can cause your blood sugar to go to low.   Warning signs of low blood sugar include: Feeling shaky or dizzy Feeling weak or tired  Excessive hunger Feeling anxious or upset  Sweating even when you aren't exercising  What to do if I experience low blood sugar? Follow the Rule of 15 Check your blood sugar with your meter. If lower than 70, proceed to step 2.  Treat with 15 grams of fast acting carbs which is found in 3-4 glucose tablets. If none are available you can try hard candy, 1 tablespoon of sugar or honey,4 ounces of fruit juice, or 6 ounces of REGULAR soda.  Re-check your sugar in 15 minutes. If it is still below 70, do what you did in step 2 again. If your blood sugar has come back up, go ahead and eat a snack or small meal made up of complex carbs (ex. Whole grains) and protein at this time to avoid recurrence of low blood sugar.

## 2021-04-29 NOTE — Progress Notes (Signed)
Subjective:    Patient ID: Amanda Shepherd, female    DOB: 1951-10-15, 70 y.o.   MRN: [id number]  HPI Patient is a 70 y.o. female who presents for diabetes management. She is in good spirits and presents without assistance. Patient was referred on and last seen by Primary Care Provider on 03/03/21.  Patient reports diabetes was diagnosed about 10-20 years ago.   Insurance coverage/medication affordability: Medicare  Current diabetes medications include: metformin XR 500mg , farxiga 10mg   Current hypertension medications include: n/a Current hyperlipidemia medications include: rosuvastatin 5mg  Patient reports not taking as she states she has heard "not good things about these types of medications." Patient states that She is taking her diabetes medications as prescribed. Patient reports adherence with medications. Patient states that She does not miss doses of her medications.   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 Patient reports polyuria (increased urination).  Patient denies polyphagia (increased appetite).  Patient denies polydipsia (increased thirst).  Patient denies neuropathy (nerve pain). Patient denies visual changes. Patient reports self foot exams.   Objective:   Physical Exam Neurological:     Mental Status: She is alert and oriented to person, place, and time.    Review of Systems  Gastrointestinal:  Negative for nausea and vomiting.    Labs:   Lab Results  Component Value Date   HGBA1C 9.4 (A) 02/04/2021   HGBA1C 8.3 (A) 04/02/2020   HGBA1C 10.2 (H) 04/02/2019     Lipid Panel     Component Value Date/Time   CHOL 212 (H) 04/02/2020 1507   TRIG 83 04/02/2020 1507   HDL 59 04/02/2020 1507   CHOLHDL 3.6 04/02/2020 1507   CHOLHDL 3.4 Ratio 11/18/2010 2037   CHOLHDL 3.4 Ratio 11/18/2010 2037   VLDL 12 11/18/2010 2037   VLDL 12 11/18/2010 2037   LDLCALC 138 (H) 04/02/2020 1507   LDLDIRECT 135 (H) 03/06/2013 1608    Clinical Atherosclerotic Cardiovascular Disease (ASCVD): No  The 10-year ASCVD risk score Mikey Bussing DC Jr., et al., 2013) is: 27.7%   Values used to calculate the score:     Age: 87 years     Sex: Female     Is Non-Hispanic African American: Yes     Diabetic: Yes     Tobacco smoker: No     Systolic Blood Pressure: 562 mmHg     Is BP treated: No     HDL Cholesterol: 59 mg/dL     Total Cholesterol: 212 mg/dL    Assessment/Plan:   T2DM is not controlled likely due to suboptimal dietary habits and patient being reluctant to additional medication. Medication adherence appears optimal. Additional pharmacotherapy is not warranted at this time. Patient requested to continue to work on dietary changes and delay checking A1C for a few weeks. Patient agreeable to discuss further medication changes at next visit if A1C still uncontrolled. Following instruction patient verbalized understanding of treatment plan.    Continued SGLT2-I dapagliflozin Wilder Glade) 10mg   Extensively discussed pathophysiology of diabetes, dietary effects on blood sugar control, and recommended lifestyle interventions. Patient will adhere to dietary modifications and switch  out soda for zero sugar drinks Counseled on s/sx of and management of hypoglycemia Next A1C anticipated at next appt.  ASCVD risk - primary prevention in patient with diabetes. Last LDL is not controlled. ASCVD risk score is >20%   Obtain updated lipid panel at next visit.  Patient agreeable to discuss statin at next visit  Follow-up appointment 3 weeks to review sugar readings. Written patient  instructions provided.  This appointment required 30 minutes of direct patient care.  Thank you for involving pharmacy to assist in providing this patient's care.

## 2021-04-30 NOTE — Assessment & Plan Note (Signed)
ASCVD risk - primary prevention in patient with diabetes. Last LDL is not controlled. ASCVD risk score is >20%   1. Obtain updated lipid panel at next visit.  2. Patient agreeable to discuss statin at next visit

## 2021-04-30 NOTE — Assessment & Plan Note (Signed)
T2DM is not controlled likely due to suboptimal dietary habits and patient being reluctant to additional medication. Medication adherence appears optimal. Additional pharmacotherapy is not warranted at this time. Patient requested to continue to work on dietary changes and delay checking A1C for a few weeks. Patient agreeable to discuss further medication changes at next visit if A1C still uncontrolled. Following instruction patient verbalized understanding of treatment plan.    1. Continued SGLT2-I dapagliflozin Wilder Glade) 10mg   2. Extensively discussed pathophysiology of diabetes, dietary effects on blood sugar control, and recommended lifestyle interventions. 3. Patient will adhere to dietary modifications and switch out soda for zero sugar drinks 4. Counseled on s/sx of and management of hypoglycemia 5. Next A1C anticipated at next appt.

## 2021-05-09 ENCOUNTER — Other Ambulatory Visit: Payer: Self-pay | Admitting: Family Medicine

## 2021-05-09 DIAGNOSIS — E113299 Type 2 diabetes mellitus with mild nonproliferative diabetic retinopathy without macular edema, unspecified eye: Secondary | ICD-10-CM

## 2021-05-09 DIAGNOSIS — IMO0002 Reserved for concepts with insufficient information to code with codable children: Secondary | ICD-10-CM

## 2021-05-20 ENCOUNTER — Ambulatory Visit: Payer: BC Managed Care – PPO | Admitting: Pharmacist

## 2021-05-26 ENCOUNTER — Other Ambulatory Visit: Payer: Self-pay | Admitting: Family Medicine

## 2021-05-26 DIAGNOSIS — Z1231 Encounter for screening mammogram for malignant neoplasm of breast: Secondary | ICD-10-CM

## 2021-06-02 ENCOUNTER — Other Ambulatory Visit: Payer: Self-pay | Admitting: Family Medicine

## 2021-06-02 DIAGNOSIS — E113299 Type 2 diabetes mellitus with mild nonproliferative diabetic retinopathy without macular edema, unspecified eye: Secondary | ICD-10-CM

## 2021-06-02 DIAGNOSIS — IMO0002 Reserved for concepts with insufficient information to code with codable children: Secondary | ICD-10-CM

## 2021-06-11 ENCOUNTER — Other Ambulatory Visit: Payer: Self-pay | Admitting: Family Medicine

## 2021-07-16 ENCOUNTER — Ambulatory Visit
Admission: RE | Admit: 2021-07-16 | Discharge: 2021-07-16 | Disposition: A | Discharge status: Home / Self Care | Payer: Medicare Other | Source: Ambulatory Visit | Attending: Podiatry | Admitting: Podiatry

## 2021-07-16 ENCOUNTER — Other Ambulatory Visit: Payer: Self-pay

## 2021-07-16 DIAGNOSIS — Z1231 Encounter for screening mammogram for malignant neoplasm of breast: Secondary | ICD-10-CM

## 2021-08-27 ENCOUNTER — Ambulatory Visit (AMBULATORY_SURGERY_CENTER): Payer: Medicare Other | Admitting: *Deleted

## 2021-08-27 ENCOUNTER — Other Ambulatory Visit: Payer: Self-pay

## 2021-08-27 ENCOUNTER — Telehealth: Payer: Self-pay | Admitting: Internal Medicine

## 2021-08-27 VITALS — Ht 69.0 in | Wt 134.0 lb

## 2021-08-27 DIAGNOSIS — Z1211 Encounter for screening for malignant neoplasm of colon: Secondary | ICD-10-CM

## 2021-08-27 MED ORDER — NA SULFATE-K SULFATE-MG SULF 17.5-3.13-1.6 GM/177ML PO SOLN: 1.0000 | ORAL | 0 refills | Status: DC

## 2021-08-27 NOTE — Telephone Encounter (Signed)
Inbound call from pt requesting a call back stating that she was given the wrong dates on her instructions. Her instructions was set based off her previous procedure date. She asked if she can get an updated copy mailed to her. Please advise. Thank you.

## 2021-08-27 NOTE — Telephone Encounter (Signed)
New suprep instructions for colonoscopy on 09/24/21 sent to [facility] and mailed to patient.

## 2021-08-27 NOTE — Progress Notes (Signed)

## 2021-09-17 ENCOUNTER — Encounter: Payer: BC Managed Care – PPO | Admitting: Internal Medicine

## 2021-09-24 ENCOUNTER — Ambulatory Visit (AMBULATORY_SURGERY_CENTER): Payer: Medicare Other | Admitting: Internal Medicine

## 2021-09-24 ENCOUNTER — Other Ambulatory Visit: Payer: Self-pay

## 2021-09-24 ENCOUNTER — Encounter: Payer: Self-pay | Admitting: Internal Medicine

## 2021-09-24 VITALS — BP 124/83 | HR 82 | Temp 97.8°F | Resp 12 | Ht 69.0 in | Wt 134.0 lb

## 2021-09-24 DIAGNOSIS — Z1211 Encounter for screening for malignant neoplasm of colon: Secondary | ICD-10-CM

## 2021-09-24 DIAGNOSIS — D123 Benign neoplasm of transverse colon: Secondary | ICD-10-CM

## 2021-09-24 DIAGNOSIS — D122 Benign neoplasm of ascending colon: Secondary | ICD-10-CM | POA: Diagnosis not present

## 2021-09-24 DIAGNOSIS — D120 Benign neoplasm of cecum: Secondary | ICD-10-CM | POA: Diagnosis not present

## 2021-09-24 MED ORDER — SODIUM CHLORIDE 0.9 % IV SOLN: 500.0000 mL | Freq: Once | INTRAVENOUS | Status: DC

## 2021-09-24 NOTE — Progress Notes (Signed)
Called to room to assist during endoscopic procedure.  Patient ID and intended procedure confirmed with present staff. Received instructions for my participation in the procedure from the performing physician.  

## 2021-09-24 NOTE — Progress Notes (Signed)
GASTROENTEROLOGY PROCEDURE H&P NOTE   Primary Care Physician: [name], MD    Reason for Procedure:   Colon cancer screening  Plan:    Colonoscopy  Patient is appropriate for endoscopic procedure(s) in the ambulatory ([facility]) setting.  The nature of the procedure, as well as the risks, benefits, and alternatives were carefully and thoroughly reviewed with the patient. Ample time for discussion and questions allowed. The patient understood, was satisfied, and agreed to proceed.     HPI: Amanda Shepherd is a 70 y.o. female who presents for colonoscopy for colon cancer screening. Her last colonoscopy was in 2006 that was normal. Denies blood in stools, changes in bowel habits, weight loss. Denies fam hx of colon cancer. Denies blood thinners.  Past Medical History:  Diagnosis Date   Bronchitis    Cough in adult 10/27/2013   Diabetes mellitus    Hyperlipidemia     Past Surgical History:  Procedure Laterality Date   ABDOMINAL HYSTERECTOMY     COLONOSCOPY  07/15/2005   at [facility]    Prior to Admission medications   Medication Sig Start Date End Date Taking? Authorizing Provider  blood glucose meter kit and supplies Dispense Glucometer + testing supplies QAC-HS based on patient and insurance preference. Use up to four times daily as directed. (FOR ICD-9 250.00, 250.01). 1 month supply. 02/04/21  Yes [name], MD  FARXIGA 10 MG TABS tablet Take 1 tablet by mouth once daily 05/09/21  Yes [name], MD  glucose blood (ONE TOUCH ULTRA TEST) test strip Check sugar 2 x daily 02/04/21  Yes [name], MD  metFORMIN (GLUCOPHAGE-XR) 500 MG 24 hr tablet Take 1 tablet by mouth once daily with breakfast 06/03/21  Yes [name], MD  [facility] DELICA LANCETS 29J MISC 1 Device by Does not apply route 2 (two) times daily. 01/18/18  Yes Harriet Butte, DO  acetaminophen (TYLENOL) 325 MG tablet Take 650 mg by mouth at bedtime as needed for fever. Patient not taking:  Reported on 09/24/2021    [provider]  Multiple Vitamins-Minerals (WOMENS MULTIVITAMIN PO) Take by mouth. Patient not taking: Reported on 09/24/2021    [provider]    Current Outpatient Medications  Medication Sig Dispense Refill   blood glucose meter kit and supplies Dispense Glucometer + testing supplies QAC-HS based on patient and insurance preference. Use up to four times daily as directed. (FOR ICD-9 250.00, 250.01). 1 month supply. 1 each 0   FARXIGA 10 MG TABS tablet Take 1 tablet by mouth once daily 90 tablet 0   glucose blood (ONE TOUCH ULTRA TEST) test strip Check sugar 2 x daily 100 each 11   metFORMIN (GLUCOPHAGE-XR) 500 MG 24 hr tablet Take 1 tablet by mouth once daily with breakfast 90 tablet 1   ONETOUCH DELICA LANCETS 24Q MISC 1 Device by Does not apply route 2 (two) times daily. 100 each 11   acetaminophen (TYLENOL) 325 MG tablet Take 650 mg by mouth at bedtime as needed for fever. (Patient not taking: Reported on 09/24/2021)     Multiple Vitamins-Minerals (WOMENS MULTIVITAMIN PO) Take by mouth. (Patient not taking: Reported on 09/24/2021)     Current Facility-Administered Medications  Medication Dose Route Frequency Provider Last Rate Last Admin   0.9 %  sodium chloride infusion  500 mL Intravenous Once Sharyn Creamer, MD        Allergies as of 09/24/2021 - Review Complete 09/24/2021  Allergen Reaction Noted   Atorvastatin Other (See  Comments)     Family History  Problem Relation Age of Onset   Hypertension Mother    Colon cancer Neg Hx     Social History   Socioeconomic History   Marital status: Single    Spouse name: Not on file   Number of children: Not on file   Years of education: Not on file   Highest education level: Not on file  Occupational History   Not on file  Tobacco Use   Smoking status: Never   Smokeless tobacco: Never  Vaping Use   Vaping Use: Never used  Substance and Sexual Activity   Alcohol use: No   Drug use:  No   Sexual activity: Never  Other Topics Concern   Not on file  Social History Narrative   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    Social Determinants of Health   Financial Resource Strain: Not on file  Food Insecurity: Not on file  Transportation Needs: Not on file  Physical Activity: Not on file  Stress: Not on file  Social Connections: Not on file  Intimate Partner Violence: Not on file    Physical Exam: Vital signs in last 24 hours: BP 140/83   Pulse 92   Temp 97.8 F (36.6 C) (Skin)   Resp 15   Ht _0  (1.753 m)   Wt 134 lb (60.8 kg)   SpO2 98%   BMI 19.79 kg/m  GEN: NAD EYE: Sclerae anicteric ENT: MMM CV: Non-tachycardic Pulm: No increased work of breathing GI: Soft, NT/ND NEURO:  Alert & Oriented   Christia Reading, MD [facility]  09/24/2021 1:39 PM

## 2021-09-24 NOTE — Progress Notes (Signed)
To PACU, VSS. Report to [email or web address] 

## 2021-09-24 NOTE — Patient Instructions (Signed)
Handouts on polyps, diverticulosis, and hemorrhoids given to you today  YOU HAD AN ENDOSCOPIC PROCEDURE TODAY AT [facility]:   Refer to the procedure report that was given to you for any specific questions about what was found during the examination.  If the procedure report does not answer your questions, please call your gastroenterologist to clarify.  If you requested that your care partner not be given the details of your procedure findings, then the procedure report has been included in a sealed envelope for you to review at your convenience later.  YOU SHOULD EXPECT: Some feelings of bloating in the abdomen. Passage of more gas than usual.  Walking can help get rid of the air that was put into your GI tract during the procedure and reduce the bloating. If you had a lower endoscopy (such as a colonoscopy or flexible sigmoidoscopy) you may notice spotting of blood in your stool or on the toilet paper. If you underwent a bowel prep for your procedure, you may not have a normal bowel movement for a few days.  Please Note:  You might notice some irritation and congestion in your nose or some drainage.  This is from the oxygen used during your procedure.  There is no need for concern and it should clear up in a day or so.  SYMPTOMS TO REPORT IMMEDIATELY:  Following lower endoscopy (colonoscopy or flexible sigmoidoscopy):  Excessive amounts of blood in the stool  Significant tenderness or worsening of abdominal pains  Swelling of the abdomen that is new, acute  Fever of 100F or higher  For urgent or emergent issues, a gastroenterologist can be reached at any hour by calling [phone]. Do not use MyChart messaging for urgent concerns.    DIET:  We do recommend a small meal at first, but then you may proceed to your regular diet.  Drink plenty of fluids but you should avoid alcoholic beverages for 24 hours.  ACTIVITY:  You should plan to take it easy for the rest of today  and you should NOT DRIVE or use heavy machinery until tomorrow (because of the sedation medicines used during the test).    FOLLOW UP: Our staff will call the number listed on your records 48-72 hours following your procedure to check on you and address any questions or concerns that you may have regarding the information given to you following your procedure. If we do not reach you, we will leave a message.  We will attempt to reach you two times.  During this call, we will ask if you have developed any symptoms of COVID 19. If you develop any symptoms (ie: fever, flu-like symptoms, shortness of breath, cough etc.) before then, please call [phone].  If you test positive for Covid 19 in the 2 weeks post procedure, please call and report this information to [place].    If any biopsies were taken you will be contacted by phone or by letter within the next 1-3 weeks.  Please call us at [phone] if you have not heard about the biopsies in 3 weeks.    SIGNATURES/CONFIDENTIALITY: You and/or your care partner have signed paperwork which will be entered into your electronic medical record.  These signatures attest to the fact that that the information above on your After Visit Summary has been reviewed and is understood.  Full responsibility of the confidentiality of this discharge information lies with you and/or your care-partner.

## 2021-09-24 NOTE — Op Note (Signed)
[facility] Patient Name: Amanda Shepherd Procedure Date: 09/24/2021 12:48 PM MRN: [id number] Endoscopist: Sonny Masters "Amanda Shepherd ,  Age: 70 Referring MD:  Date of Birth: 1950/11/01 Gender: Female Account #: [id number] Procedure:                Colonoscopy Indications:              Screening for colorectal malignant neoplasm Medicines:                Monitored Anesthesia Care Procedure:                Pre-Anesthesia Assessment:                           - Prior to the procedure, a History and Physical                            was performed, and patient medications and                            allergies were reviewed. The patient's tolerance of                            previous anesthesia was also reviewed. The risks                            and benefits of the procedure and the sedation                            options and risks were discussed with the patient.                            All questions were answered, and informed consent                            was obtained. Prior Anticoagulants: The patient has                            taken no previous anticoagulant or antiplatelet                            agents. ASA Grade Assessment: II - A patient with                            mild systemic disease. After reviewing the risks                            and benefits, the patient was deemed in                            satisfactory condition to undergo the procedure.                           After obtaining informed consent, the colonoscope  was passed under direct vision. Throughout the                            procedure, the patient's blood pressure, pulse, and                            oxygen saturations were monitored continuously. The                            Olympus PCF-H190DL ([id number]) Colonoscope was                            introduced through the anus and advanced to the the                            terminal  ileum. The colonoscopy was performed                            without difficulty. The patient tolerated the                            procedure well. The quality of the bowel                            preparation was good. The terminal ileum, ileocecal                            valve, appendiceal orifice, and rectum were                            photographed. Scope In: 1:43:18 PM Scope Out: 2:07:20 PM Scope Withdrawal Time: 0 hours 16 minutes 59 seconds  Total Procedure Duration: 0 hours 24 minutes 2 seconds  Findings:                 The terminal ileum appeared normal.                           Five sessile polyps were found in the transverse                            colon, ascending colon and cecum. The polyps were 2                            to 5 mm in size. These polyps were removed with a                            cold snare. Resection and retrieval were complete.                           A few small-mouthed diverticula were found in the                            sigmoid colon and descending colon.  Non-bleeding internal hemorrhoids were found during                            retroflexion. Complications:            No immediate complications. Estimated Blood Loss:     Estimated blood loss was minimal. Impression:               - The examined portion of the ileum was normal.                           - Five 2 to 5 mm polyps in the transverse colon, in                            the ascending colon and in the cecum, removed with                            a cold snare. Resected and retrieved.                           - Diverticulosis in the sigmoid colon and in the                            descending colon.                           - Non-bleeding internal hemorrhoids. Recommendation:           - Discharge patient to home (with escort).                           - Await pathology results.                           - The findings and  recommendations were discussed                            with the patient. Sonny Masters "Amanda Shepherd,  09/24/2021 2:11:23 PM

## 2021-09-24 NOTE — Progress Notes (Signed)
Pt's states no medical or surgical changes since previsit or office visit. VS assessed by C.W 

## 2021-09-26 ENCOUNTER — Telehealth: Payer: Self-pay

## 2021-09-26 NOTE — Telephone Encounter (Signed)
Left message on follow up call. 

## 2021-09-26 NOTE — Telephone Encounter (Signed)
  Follow up Call-  Call back number 09/24/2021  Post procedure Call Back phone  # [phone]  Permission to leave phone message Yes  Some recent data might be hidden     Patient questions:  Do you have a fever, pain , or abdominal swelling? No. Pain Score  0 *  Have you tolerated food without any problems? Yes.    Have you been able to return to your normal activities? Yes.    Do you have any questions about your discharge instructions: Diet   No. Medications  No. Follow up visit  No.  Do you have questions or concerns about your Care? No.  Actions: * If pain score is 4 or above: No action needed, pain <4.

## 2021-10-01 ENCOUNTER — Encounter: Payer: Self-pay | Admitting: Internal Medicine

## 2021-12-16 ENCOUNTER — Other Ambulatory Visit: Payer: Self-pay | Admitting: Family Medicine

## 2022-02-11 ENCOUNTER — Other Ambulatory Visit: Payer: Self-pay | Admitting: Family Medicine

## 2022-03-13 ENCOUNTER — Ambulatory Visit (INDEPENDENT_AMBULATORY_CARE_PROVIDER_SITE_OTHER): Payer: Medicare Other | Admitting: Family Medicine

## 2022-03-13 ENCOUNTER — Encounter: Payer: Self-pay | Admitting: Family Medicine

## 2022-03-13 VITALS — BP 142/78 | HR 72 | Ht 69.0 in | Wt 122.8 lb

## 2022-03-13 DIAGNOSIS — E785 Hyperlipidemia, unspecified: Secondary | ICD-10-CM | POA: Diagnosis not present

## 2022-03-13 DIAGNOSIS — N1832 Chronic kidney disease, stage 3b: Secondary | ICD-10-CM

## 2022-03-13 DIAGNOSIS — I10 Essential (primary) hypertension: Secondary | ICD-10-CM | POA: Diagnosis not present

## 2022-03-13 DIAGNOSIS — E119 Type 2 diabetes mellitus without complications: Secondary | ICD-10-CM

## 2022-03-13 DIAGNOSIS — E1169 Type 2 diabetes mellitus with other specified complication: Secondary | ICD-10-CM

## 2022-03-13 LAB — POCT GLYCOSYLATED HEMOGLOBIN (HGB A1C): HbA1c, POC (controlled diabetic range): 10.9 % — AB (ref 0.0–7.0)

## 2022-03-13 MED ORDER — METFORMIN HCL ER 500 MG PO TB24: 500.0000 mg | ORAL_TABLET | Freq: Every day | ORAL | 0 refills | Status: DC

## 2022-03-13 MED ORDER — ROSUVASTATIN CALCIUM 5 MG PO TABS: 5.0000 mg | ORAL_TABLET | Freq: Every day | ORAL | 0 refills | Status: DC

## 2022-03-13 MED ORDER — DAPAGLIFLOZIN PROPANEDIOL 10 MG PO TABS: 10.0000 mg | ORAL_TABLET | Freq: Every day | ORAL | 0 refills | Status: DC

## 2022-03-13 NOTE — Progress Notes (Unsigned)
    SUBJECTIVE:   CHIEF COMPLAINT / HPI:   Type 2 Diabetes Patient is a 71 y.o. female who presents today for diabetes follow-up.  Home medications include: Metformin '500mg'$  daily and Farxiga '10mg'$  daily Patient reports good medication compliance. Ran out of Metformin 1 week ago. Forgets doses once every few weeks. Patient does not check sugar at home. Hasn't been exercising as much due to caring for 56 year old child Dietary indiscretion- sometimes binges on coke  Most recent A1Cs:  Lab Results  Component Value Date   HGBA1C 9.4 (A) 02/04/2021   HGBA1C 8.3 (A) 04/02/2020   HGBA1C 10.2 (H) 04/02/2019   Last Microalbumin, LDL, Creatinine: Lab Results  Component Value Date   LDLCALC 138 (H) 04/02/2020   CREATININE 1.49 (H) 02/04/2021    Patient is not up to date on diabetic eye.- was told she needs to wait until September due to insurance. Patient is not up to date on diabetic foot exam.   HTN   PERTINENT  PMH / PSH: ***  OBJECTIVE:   There were no vitals taken for this visit.  ***  ASSESSMENT/PLAN:   No problem-specific Assessment & Plan notes found for this encounter.     [name], MD [facility]

## 2022-03-13 NOTE — Patient Instructions (Addendum)
It was great to see you!  Your A1c was 10.9% today, which means your diabetes is not well-controlled. Your goal A1c is less than 7.0%. Continue your current medications for now. I have sent refills to your pharmacy.  I have scheduled an appointment with our pharmacy team to discuss additional medication options for your diabetes.   Start checking your sugar twice daily. Once in the morning (fasting) and once ~1-2 hours after a meal. Write the numbers down and bring them to your next appointment.  We are checking some labs. I will send you a MyChart message with the results.  Take care and seek immediate care sooner if you develop any concerns.  Dr. [facility]

## 2022-03-14 LAB — BASIC METABOLIC PANEL
BUN/Creatinine Ratio: 16 (ref 12–28)
BUN: 22 mg/dL (ref 8–27)
CO2: 21 mmol/L (ref 20–29)
Calcium: 9.9 mg/dL (ref 8.7–10.3)
Chloride: 102 mmol/L (ref 96–106)
Creatinine, Ser: 1.34 mg/dL — ABNORMAL HIGH (ref 0.57–1.00)
Glucose: 237 mg/dL — ABNORMAL HIGH (ref 70–99)
Potassium: 4 mmol/L (ref 3.5–5.2)
Sodium: 140 mmol/L (ref 134–144)
eGFR: 43 mL/min/{1.73_m2} — ABNORMAL LOW (ref >59)

## 2022-03-14 LAB — LIPID PANEL
Chol/HDL Ratio: 3.3 ratio (ref 0.0–4.4)
Cholesterol, Total: 220 mg/dL — ABNORMAL HIGH (ref 100–199)
HDL: 67 mg/dL (ref >39)
LDL Chol Calc (NIH): 136 mg/dL — ABNORMAL HIGH (ref 0–99)
Triglycerides: 94 mg/dL (ref 0–149)
VLDL Cholesterol Cal: 17 mg/dL (ref 5–40)

## 2022-03-14 NOTE — Assessment & Plan Note (Addendum)
Poorly controlled. A1c 10.9% today. Her goal a1c is 7. Patient appears to have low health literacy as it relates to her diabetes. Initially requested 3 months to work on dietary changes. After discussion she was amenable to additional medication as long as it's not insulin. -Continue current meds (Metformin, Wilder Glade) for now -Appointment scheduled with Dr Valentina Lucks to discuss additional medication options. She is very resistant to the idea of insulin. Considered GLP-1 however I'm cautious given her BMI of 18. Possible candidate for oral hypoglycemic. Will defer to pharmacy team. -Check BMP, lipids today -Restart Rosuvastatin '5mg'$  -Discussed importance of more frequent follow up/A1c monitoring (q3 months)

## 2022-03-14 NOTE — Assessment & Plan Note (Signed)
BP elevated today x2. Took Lisinopril at one point but had been normotensive off meds since 2020. -Return in 1 month for BP follow up

## 2022-03-17 ENCOUNTER — Other Ambulatory Visit: Payer: Self-pay | Admitting: Family Medicine

## 2022-03-24 ENCOUNTER — Encounter: Payer: Self-pay | Admitting: *Deleted

## 2022-03-30 ENCOUNTER — Ambulatory Visit (INDEPENDENT_AMBULATORY_CARE_PROVIDER_SITE_OTHER): Payer: Medicare Other | Admitting: Pharmacist

## 2022-03-30 ENCOUNTER — Encounter: Payer: Self-pay | Admitting: Pharmacist

## 2022-03-30 DIAGNOSIS — E1169 Type 2 diabetes mellitus with other specified complication: Secondary | ICD-10-CM

## 2022-03-30 MED ORDER — TRULICITY 0.75 MG/0.5ML ~~LOC~~ SOAJ: 0.7500 mg | SUBCUTANEOUS | 0 refills | Status: DC

## 2022-03-30 NOTE — Progress Notes (Signed)
S:     Chief Complaint  Patient presents with   Medication Management    Diabetes    Amanda Shepherd is a 71 y.o. female who presents for diabetes evaluation, education, and management. PMH is significant for T2DM, CKD, and HTN. Patient was referred and last seen by Primary Care Provider, Dr. Rock Nephew, on 03/13/22.  At last visit, patient shared that she was uninterested in utilizing insulin injection therapy.   Today, patient arrives in good spirits and presents without any assistance.   Patient reports Diabetes was diagnosed "a long time ago".  Chart review shows her diagnosis in 2012 but it may be as early as 2008.   Current diabetes medications include: Metformin '500mg'$  XR - Once daily (previously higher dose of '1000mg'$  BID but dose was reduced with recent worsening of renal function), Dapagliflozin '10mg'$  daily.  Current hypertension medications include: None Current hyperlipidemia medications include: Rosuvastatin '5mg'$  daily.   Patient reports taking all medications as prescribed.  Do you feel that your medications are working for you? No - she would like to stop her metformin Have you been experiencing any side effects to the medications prescribed? no Do you have any problems obtaining medications due to transportation or finances? no  Patient denies hypoglycemic events.  Reported home fasting blood sugars: routinely in the 200-299 range.    Patient reported dietary habits: Eats small/few meals/day Drinks: regular soda (coke) - willing to switch to coke zero or water.   Patient-reported exercise habits: limited   O:  Physical Exam Constitutional:      Appearance: Normal appearance.  Neurological:     Mental Status: She is alert.  Psychiatric:        Mood and Affect: Mood normal.     Comments: Asked multiple questions - multiple times. Appeared unable to comprehend / absorb information multiple times during intervew.     Review of Systems  All other systems reviewed and  are negative.   Lab Results  Component Value Date   HGBA1C 10.9 (A) 03/13/2022   Vitals:   03/30/22 1031 03/30/22 1053  BP: (!) 180/82 (!) 147/78  Pulse: 76   SpO2: 100%     Lipid Panel     Component Value Date/Time   CHOL 220 (H) 03/13/2022 0947   TRIG 94 03/13/2022 0947   HDL 67 03/13/2022 0947   CHOLHDL 3.3 03/13/2022 0947   CHOLHDL 3.4 Ratio 11/18/2010 2037   CHOLHDL 3.4 Ratio 11/18/2010 2037   VLDL 12 11/18/2010 2037   VLDL 12 11/18/2010 2037   LDLCALC 136 (H) 03/13/2022 0947   LDLDIRECT 135 (H) 03/06/2013 1608    Clinical Atherosclerotic Cardiovascular Disease (ASCVD): No  The 10-year ASCVD risk score (Arnett DK, et al., 2019) is: 33.1%   Values used to calculate the score:     Age: 69 years     Sex: Female     Is Non-Hispanic African American: Yes     Diabetic: Yes     Tobacco smoker: No     Systolic Blood Pressure: 604 mmHg     Is BP treated: No     HDL Cholesterol: 67 mg/dL     Total Cholesterol: 220 mg/dL    A/P: Diabetes longstanding of 15 years duration currently with poor control based on A1c for the last several years.  BMI of 18 is concerning for LADA with need for insulin therapy however. Patient is reluctant to try insulin again due to experience of having symptomatic low  readings during a previous trial of insulin in the past.  Patient is highly concerned about hypoglycemia.  Medication adherence appears good however she believe metformin should be stopped.  Her dose has been reduced to only '500mg'$  XR daily. Control is suboptimal due to inadequate drug therapy, dietary indiscretion (continues to consume regular coke).  - discussed use of  basal insulin.  Patient uninterested and unwilling to initiate even ultra-low dose as she had hypoglycemia with 6 or 8 units in the past.    -Initiated a trial of GLP-1 Trulicity (dulaglutide) 0.'75mg'$  once weekly. Patient educated on purpose, proper use and potential adverse effects of nausea/vomiting.  Following  instruction patient verbalized understanding of treatment plan. We discussed concern for any weight loss.  -Continued  SGLT2-I Farxiga (dapagliflozin) at '10mg'$  daily.  -Continued metformin at '500mg'$  XR once daily.  (Agreed to readdress use of metformin and potential discontinuation at future visit).  -Patient educated on purpose, proper use, and potential benefits and adverse effects of low dose basal which I suggested we would initiate very slowly to minimize risk and increase very gradually.  Patient desired to trial Trulicity first.  -Extensively discussed pathophysiology of diabetes, recommended lifestyle interventions, dietary effects on blood sugar control.  Spent > 50% of time in visit discussing value and benefit of insulin therapy to correct blood sugar and minimize risks of poorly controlled diabetes which she is currently experiencing.    Elevated blood pressure in office.  Due to intensity of diabetes discussion, her blood pressure was deferred at this visit - follow-up in 2 weeks.   Written patient instructions provided. Patient verbalized understanding of treatment plan. Total time in face to face counseling 47 minutes.    Follow up pharmacist 2 weeks as she will need additional support and encouragement to make a significant change to insulin therapy.   PCP clinic visit in July. Patient seen with Berdie Ogren, PharmD Candidate.

## 2022-03-30 NOTE — Patient Instructions (Addendum)
Nice to see you today.  Start Trulicity 5.07 mg once weekly.   Please continue to work on your diet, exercise and stress plan.  Please take care of yourself.   Follow-up in 2 weeks with Dr. Valentina Lucks.

## 2022-03-31 NOTE — Progress Notes (Signed)
Reviewed: I agree with Dr. Koval's documentation and management. 

## 2022-03-31 NOTE — Assessment & Plan Note (Signed)
Diabetes longstanding of 15 years duration currently with poor control based on A1c for the last several years.  BMI of 18 is concerning for LADA with need for insulin therapy however. Patient is reluctant to try insulin again due to experience of having symptomatic low readings during a previous trial of insulin in the past.  Patient is highly concerned about hypoglycemia.  Medication adherence appears good however she believe metformin should be stopped.  Her dose has been reduced to only '500mg'$  XR daily. Control is suboptimal due to inadequate drug therapy, dietary indiscretion (continues to consume regular coke).  -discussed use of basal insulin.  Patient uninterested and unwilling to initiate even ultra-low dose as she had hypoglycemia with 6 or 8 units in the past.    -Initiated a trial of GLP-1 Trulicity (dulaglutide) 0.'75mg'$  once weekly. Patient educated on purpose, proper use and potential adverse effects of nausea/vomiting.  Following instruction patient verbalized understanding of treatment plan. We discussed concern for any weight loss.  -Continued  SGLT2-I Farxiga (dapagliflozin) at '10mg'$  daily.  -Continued metformin at '500mg'$  XR once daily.  (Agreed to readdress use of metformin and potential discontinuation at future visit).  -Patient educated on purpose, proper use, and potential benefits and adverse effects of low dose basal which I suggested we would initiate very slowly to minimize risk and increase very gradually.  Patient desired to trial Trulicity first.  -Extensively discussed pathophysiology of diabetes, recommended lifestyle interventions, dietary effects on blood sugar control.  Spent > 50% of time in visit discussing value and benefit of insulin therapy to correct blood sugar and minimize risks of poorly controlled diabetes which she is currently experiencing.

## 2022-04-14 ENCOUNTER — Encounter: Payer: Self-pay | Admitting: Pharmacist

## 2022-04-14 ENCOUNTER — Ambulatory Visit (INDEPENDENT_AMBULATORY_CARE_PROVIDER_SITE_OTHER): Payer: Medicare Other | Admitting: Pharmacist

## 2022-04-14 DIAGNOSIS — E1169 Type 2 diabetes mellitus with other specified complication: Secondary | ICD-10-CM

## 2022-04-14 MED ORDER — DULAGLUTIDE 1.5 MG/0.5ML ~~LOC~~ SOAJ: 1.5000 mg | SUBCUTANEOUS | 5 refills | Status: DC

## 2022-04-14 NOTE — Progress Notes (Signed)
Reviewed: I agree with Dr. Koval's documentation and management. 

## 2022-04-22 ENCOUNTER — Encounter: Payer: Self-pay | Admitting: Family Medicine

## 2022-04-22 ENCOUNTER — Ambulatory Visit (INDEPENDENT_AMBULATORY_CARE_PROVIDER_SITE_OTHER): Payer: Medicare Other | Admitting: Family Medicine

## 2022-04-22 VITALS — BP 122/78 | HR 74 | Ht 69.0 in | Wt 120.8 lb

## 2022-04-22 DIAGNOSIS — I10 Essential (primary) hypertension: Secondary | ICD-10-CM | POA: Diagnosis present

## 2022-04-22 DIAGNOSIS — Z780 Asymptomatic menopausal state: Secondary | ICD-10-CM

## 2022-04-22 DIAGNOSIS — Z0000 Encounter for general adult medical examination without abnormal findings: Secondary | ICD-10-CM

## 2022-04-22 DIAGNOSIS — E1169 Type 2 diabetes mellitus with other specified complication: Secondary | ICD-10-CM | POA: Diagnosis not present

## 2022-04-22 DIAGNOSIS — Z13820 Encounter for screening for osteoporosis: Secondary | ICD-10-CM | POA: Diagnosis not present

## 2022-04-22 MED ORDER — ONETOUCH ULTRA VI STRP: ORAL_STRIP | 1 refills | Status: DC

## 2022-04-22 MED ORDER — TETANUS-DIPHTH-ACELL PERTUSSIS 5-2.5-18.5 LF-MCG/0.5 IM SUSP: 0.5000 mL | Freq: Once | INTRAMUSCULAR | 0 refills | Status: AC

## 2022-04-22 NOTE — Patient Instructions (Addendum)
It was great to see you!  Things we discussed at today's visit: -Your blood pressure looks good today -I have sent refills on your diabetes test strips -Please get your tetanus shot at the pharmacy. Your arm is likely to be sore, but this will go away after 1 to 2 days -I have ordered a bone density scan to check for osteoporosis (weak bones). Call [facility] [phone]) for an appointment to have this done. -Please get your diabetic eye exam.  Have them fax a copy of the results to our office.  Take care and seek immediate care sooner if you develop any concerns.  Dr. [facility]

## 2022-04-22 NOTE — Assessment & Plan Note (Addendum)
Improving with recent addition of Trulicity. Majority of home CBGs in the mid to high 100s. -Continue current medications -Continue home glucose monitoring -Refills sent for test strips -Next A1c anticipated in 2 months -Encouraged to have diabetic eye exam -Check urine microalbumin at next visit

## 2022-04-22 NOTE — Assessment & Plan Note (Addendum)
BP well controlled today. Has been off anti-hypertensive medications since 2020. Continue to monitor. Consider ACE/ARB due to diabetes and CKD

## 2022-04-22 NOTE — Progress Notes (Signed)
    SUBJECTIVE:   CHIEF COMPLAINT / HPI:   Blood Pressure Follow-Up Blood pressure elevated to 175/85 with repeat measurement 142/78 at last visit on 03/13/2022. Previously had been normotensive off medications since 2020. Returns today for follow up. Reports she was particularly stressed that day with family issues. Does not check BP at home.  T2DM Last A1c was 10.9% on 03/13/22. Now on Trulicity (in addition to her [place] and Metformin) and reports she is doing very well with the trulicity.  Brings glucometer today for me to review home CBGs. Majority readings mid 100s. No hypoglycemic symptoms or episodes.  PERTINENT  PMH / PSH: T2DM, CKD IIIb, HLD  OBJECTIVE:   BP 122/78   Pulse 74   Ht '5\' 9"'$  (1.753 m)   Wt 120 lb 12.8 oz (54.8 kg)   SpO2 98%   BMI 17.84 kg/m   General: NAD, pleasant, able to participate in exam CV: RRR, normal S1/S2 without m/r/g Respiratory: No respiratory distress Skin: warm and dry, no rashes noted Psych: Normal affect and mood Neuro: grossly intact   ASSESSMENT/PLAN:   Type 2 diabetes mellitus ([place]) Improving with recent addition of Trulicity. Majority of home CBGs in the mid to high 100s. -Continue current medications -Continue home glucose monitoring -Refills sent for test strips -Next A1c anticipated in 2 months -Encouraged to have diabetic eye exam -Check urine microalbumin at next visit  Primary hypertension BP well controlled today. Has been off anti-hypertensive medications since 2020. Continue to monitor. Consider ACE/ARB due to diabetes and CKD  Health Maintenance DEXA scan ordered today Given printed Rx to have TDaP at her pharmacy   [name], [address]

## 2022-05-05 ENCOUNTER — Telehealth: Payer: Self-pay | Admitting: Family Medicine

## 2022-05-05 NOTE — Telephone Encounter (Signed)
Clinical info completed on handicap form.  Placed form in Dr. Rock Nephew' box for completion.    When form is completed, please route note to "RN Team" and place in wall pocket in front office.   Rocklin Soderquist, CMA

## 2022-05-05 NOTE — Telephone Encounter (Signed)
Patient dropped off DMV placard form to be completed. Last DOS was 04/22/22. Placed in [employer]

## 2022-05-06 NOTE — Telephone Encounter (Signed)
Decline to complete form at this time. Based on chart review and my prior visits with patient, there is no known medical reason that she requires handicap placard.   If patient wishes to discuss further, she can schedule office visit.   Please inform her of above.  [name], MD PGY-3, [place]

## 2022-05-08 NOTE — Telephone Encounter (Signed)
VM left for patient.   Please schedule with PCP or any provider when she calls back.

## 2022-05-18 ENCOUNTER — Ambulatory Visit (INDEPENDENT_AMBULATORY_CARE_PROVIDER_SITE_OTHER): Payer: Medicare Other | Admitting: Pharmacist

## 2022-05-18 ENCOUNTER — Encounter: Payer: Self-pay | Admitting: Pharmacist

## 2022-05-18 DIAGNOSIS — E785 Hyperlipidemia, unspecified: Secondary | ICD-10-CM | POA: Diagnosis not present

## 2022-05-18 DIAGNOSIS — E1169 Type 2 diabetes mellitus with other specified complication: Secondary | ICD-10-CM

## 2022-05-18 MED ORDER — ROSUVASTATIN CALCIUM 20 MG PO TABS: 20.0000 mg | ORAL_TABLET | Freq: Every day | ORAL | 3 refills | Status: DC

## 2022-05-18 NOTE — Progress Notes (Signed)
Reviewed: I agree with Dr. Koval's documentation and management. 

## 2022-05-18 NOTE — Assessment & Plan Note (Signed)
Diabetes longstanding since 2008 with poor control for many years (A1c > 8) currently much improved control with addition of Trulicity. Patient is able to verbalize appropriate hypoglycemia management plan. Medication adherence appears good.  -Continued GLP-1 Trulicity (dulaglutide) at 1.'5mg'$  weekly.  -Continued SGLT2-I Farxiga (dapagliflozin) at '10mg'$  daily. Counseled on sick day rules. -Discontinued metformin as this is likely of minimal impact at current dose.   -Extensively discussed pathophysiology of diabetes, recommended lifestyle interventions, dietary effects on blood sugar control.  -Counseled on s/sx of and management of hypoglycemia.  -Next A1c anticipated 2 months.

## 2022-05-18 NOTE — Assessment & Plan Note (Signed)
ASCVD risk - primary prevention in patient with diabetes. Last LDL was > 130 and not at goal of <70 mg/dL. ASCVD risk factors include hyperlipidemia and long-standing Diabetes and 10-year ASCVD risk score of 29%. high intensity statin indicated.  -Increased dose of rosuvstatin from 5 to '20mg'$  mg.

## 2022-05-18 NOTE — Patient Instructions (Signed)
It was nice to see you today!  Your goal blood sugar is 80-130 before eating and less than 180 after eating.  Medication Changes: Continue Trulicity 1.5  Discontinue Metformin - finish your current supply  Increase Rosuvastatin to '20mg'$  once daily  Monitor blood sugars at home and keep a log (glucometer or piece of paper) to bring with you to your next visit.  Keep up the good work with diet and exercise. Aim for a diet full of vegetables, fruit and lean meats (chicken, [place], fish). Try to limit salt intake by eating fresh or frozen vegetables (instead of canned), rinse canned vegetables prior to cooking and do not add any additional salt to meals.   Next visit - Dr. Rock Nephew

## 2022-05-18 NOTE — Progress Notes (Signed)
S:     Chief Complaint  Patient presents with   Medication Management    Diabetes - Hypertension   Amanda Shepherd is a 71 y.o. female who presents for diabetes evaluation, education, and management.  PMH is significant for  Patient was referred and last seen by Primary Care Provider, Dr. Rock Nephew, on 04/22/2022.   Today, patient arrives in good spirits and presents without any assistance.   At last pharmacy visit on 04/14/2022, patient dose of Trulicity was increased.  She reports doing well on this dose, without any GI intolerance and with greatly improved blood sugar control.    Patient reports Diabetes was diagnosed in 2008 and states this is the best her blood sugar has been in recent history.  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  Current hypertension medications include: None Current hyperlipidemia medications include: rosuvastatin 66m66maily  Patient reports adherence to taking all medications as prescribed.   Do you feel that your medications are working for you? yes Have you been experiencing any side effects to the medications prescribed? no Do you have any problems obtaining medications due to transportation or finances? no  Patient denies hypoglycemic events.  Reported home fasting blood sugars: 100-150m79m fasting   Patient denies nocturia (nighttime urination) which is much improved from previously.   Meal intake is  consistent but patient feels like she does too much exercise/walking, caring for 1-ye56r-old to gain weight.   Patient-reported exercise habits: very active and    O:   Review of Systems  Genitourinary:  Negative for frequency.  All other systems reviewed and are negative.   Physical Exam Constitutional:      Appearance: Normal appearance.  Pulmonary:     Effort: Pulmonary effort  is normal.  Neurological:     Mental Status: She is alert.  Psychiatric:        Mood and Affect: Mood normal.        Behavior: Behavior normal.        Thought Content: Thought content normal.     Lab Results  Component Value Date   HGBA1C 10.9 (A) 03/13/2022   Vitals:   05/18/22 0858  BP: 136/80  Pulse: 96  SpO2: 100%    Lipid Panel     Component Value Date/Time   CHOL 220 (H) 03/13/2022 0947   TRIG 94 03/13/2022 0947   HDL 67 03/13/2022 0947   CHOLHDL 3.3 03/13/2022 0947   CHOLHDL 3.4 Ratio 11/18/2010 2037   CHOLHDL 3.4 Ratio 11/18/2010 2037   VLDL 12 11/18/2010 2037   VLDL 12 11/18/2010 2037   LDLCALC 136 (H) 03/13/2022 0947   LDLDIRECT 135 (H) 03/06/2013 1608    Clinical Atherosclerotic Cardiovascular Disease (ASCVD): No  The 10-year ASCVD risk score (Arnett DK, et al., 2019) is: 29%   Values used to calculate the score:     Age: 48 y51rs     Sex: Female     Is Non-Hispanic African American: Yes     Diabetic: Yes     Tobacco smoker: No     Systolic Blood Pressure: 136 656g     Is BP treated: No     HDL Cholesterol: 67 mg/dL     Total Cholesterol: 220 mg/dL   A/P: Diabetes longstanding since 2008 with poor control for many years (A1c > 8) currently much improved control with  addition of Trulicity. Patient is able to verbalize appropriate hypoglycemia management plan. Medication adherence appears good.  -Continued GLP-1 Trulicity (dulaglutide) at 1.58m weekly.  -Continued SGLT2-I Farxiga (dapagliflozin) at 17mdaily. Counseled on sick day rules. -Discontinued metformin as this is likely of minimal impact at current dose.   -Extensively discussed pathophysiology of diabetes, recommended lifestyle interventions, dietary effects on blood sugar control.  -Counseled on s/sx of and management of hypoglycemia.  -Next A1c anticipated 2 months.   ASCVD risk - primary prevention in patient with diabetes. Last LDL was > 130 and not at goal of <70 mg/dL. ASCVD risk  factors include hyperlipidemia and long-standing Diabetes and 10-year ASCVD risk score of 29%. high intensity statin indicated.  -Increased dose of rosuvstatin from 5 to 2039mg.   HIstory of elevated blood pressures and use of antihypertensive medications. Given history of low EGFR and blood pressure goal of <130/80  mmHg.  -Consider UACR - Consider ARB at next PCP visit.   Written patient instructions provided. Patient verbalized understanding of treatment plan.  Total time in face to face counseling 33 minutes.    Follow-up:  Pharmacist PRN followiing PCP visit. . PMarland KitchenP clinic visit in 6-8 weeks (end of September).

## 2022-05-19 ENCOUNTER — Other Ambulatory Visit: Payer: Self-pay | Admitting: Family Medicine

## 2022-05-19 ENCOUNTER — Ambulatory Visit
Admission: RE | Admit: 2022-05-19 | Discharge: 2022-05-19 | Disposition: A | Discharge status: Home / Self Care | Payer: Medicare Other | Source: Ambulatory Visit | Attending: Family Medicine | Admitting: Family Medicine

## 2022-05-19 DIAGNOSIS — Z1231 Encounter for screening mammogram for malignant neoplasm of breast: Secondary | ICD-10-CM

## 2022-05-19 DIAGNOSIS — Z13820 Encounter for screening for osteoporosis: Secondary | ICD-10-CM

## 2022-06-01 ENCOUNTER — Other Ambulatory Visit: Payer: Self-pay | Admitting: Family Medicine

## 2022-06-01 DIAGNOSIS — M810 Age-related osteoporosis without current pathological fracture: Secondary | ICD-10-CM | POA: Insufficient documentation

## 2022-06-01 MED ORDER — ALENDRONATE SODIUM 70 MG PO TABS: 70.0000 mg | ORAL_TABLET | ORAL | 11 refills | Status: DC

## 2022-06-01 MED ORDER — OYSTER SHELL CALCIUM/D3 500-5 MG-MCG PO TABS: 1.0000 | ORAL_TABLET | Freq: Every day | ORAL | 3 refills | Status: DC

## 2022-06-15 NOTE — Telephone Encounter (Signed)
Pt reports that she has had the handicap placard for years for her knees.  The one she has now expires on 06/18/22 and reports that she needs it before then. Christen Bame, CMA

## 2022-06-19 NOTE — Telephone Encounter (Signed)
Do not see any documentation regarding prior handicap placard nor history of knee problems. As advised previously, patient needs appointment to discuss.

## 2022-06-22 ENCOUNTER — Other Ambulatory Visit: Payer: Self-pay | Admitting: Family Medicine

## 2022-06-22 DIAGNOSIS — E1169 Type 2 diabetes mellitus with other specified complication: Secondary | ICD-10-CM

## 2022-07-05 IMAGING — MG MM DIGITAL SCREENING BILAT W/ TOMO AND CAD
8 series · 9 of 24 positions shown · non-contrast
Comparison: Previous exam(s).

CLINICAL DATA: Screening.

EXAM:
DIGITAL SCREENING BILATERAL MAMMOGRAM WITH TOMOSYNTHESIS AND CAD
TECHNIQUE: Bilateral screening digital craniocaudal and mediolateral oblique
mammograms were obtained. Bilateral screening digital breast
tomosynthesis was performed. The images were evaluated with
computer-aided detection.

[R MLO synth-2D]
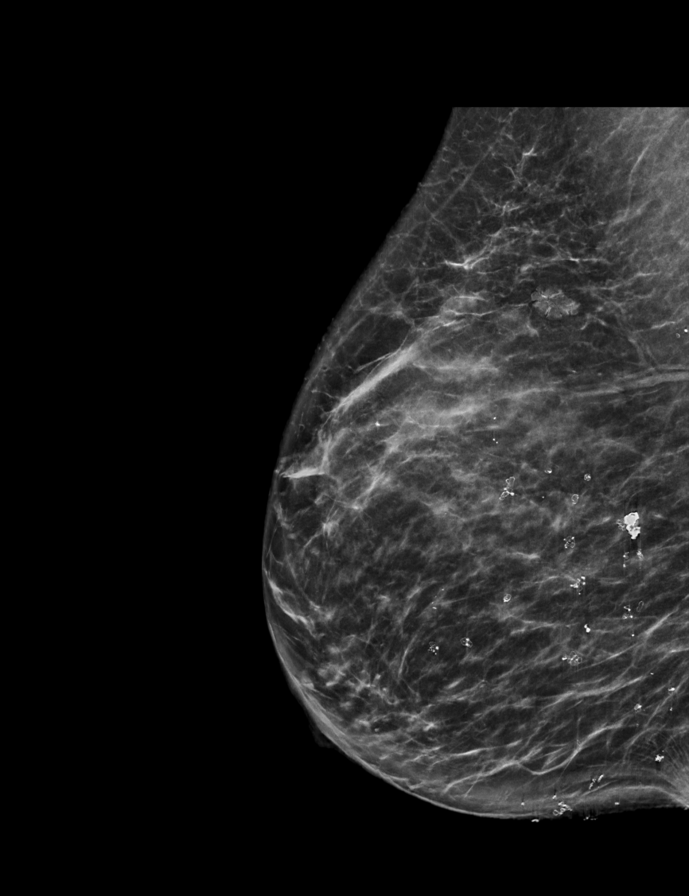

[R CC synth-2D]
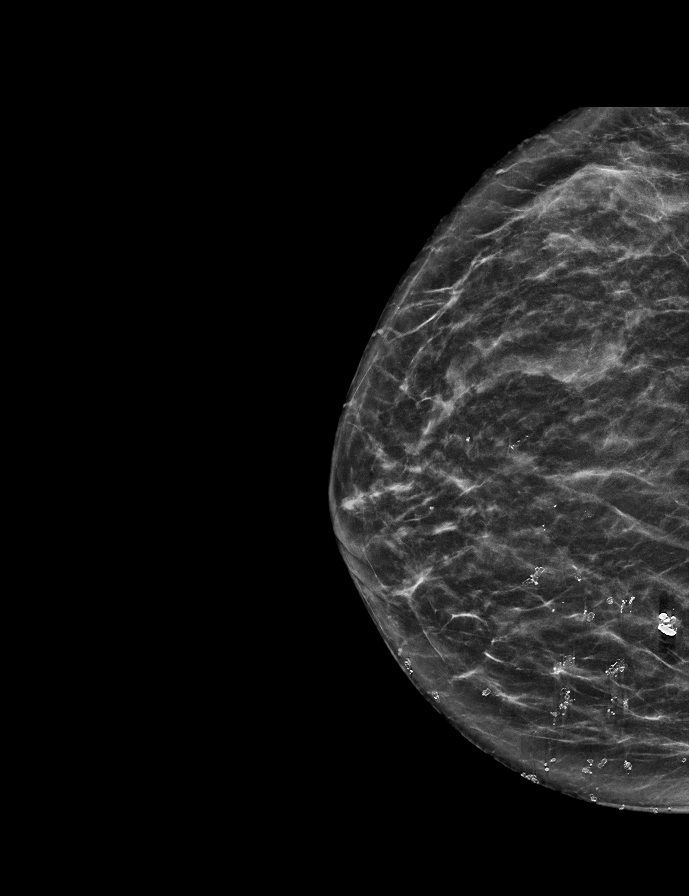

[L MLO synth-2D]
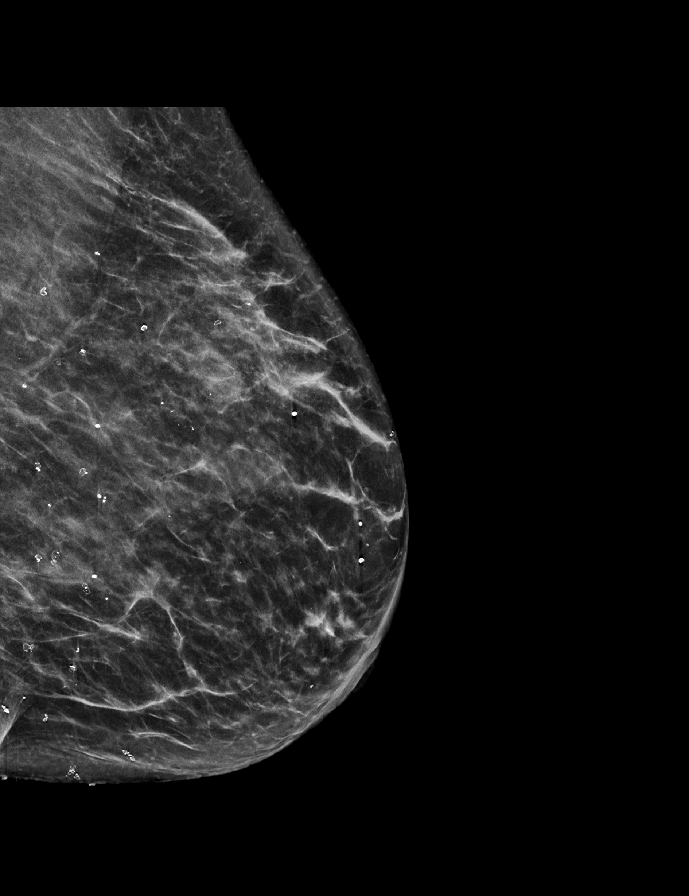

[L CC synth-2D]
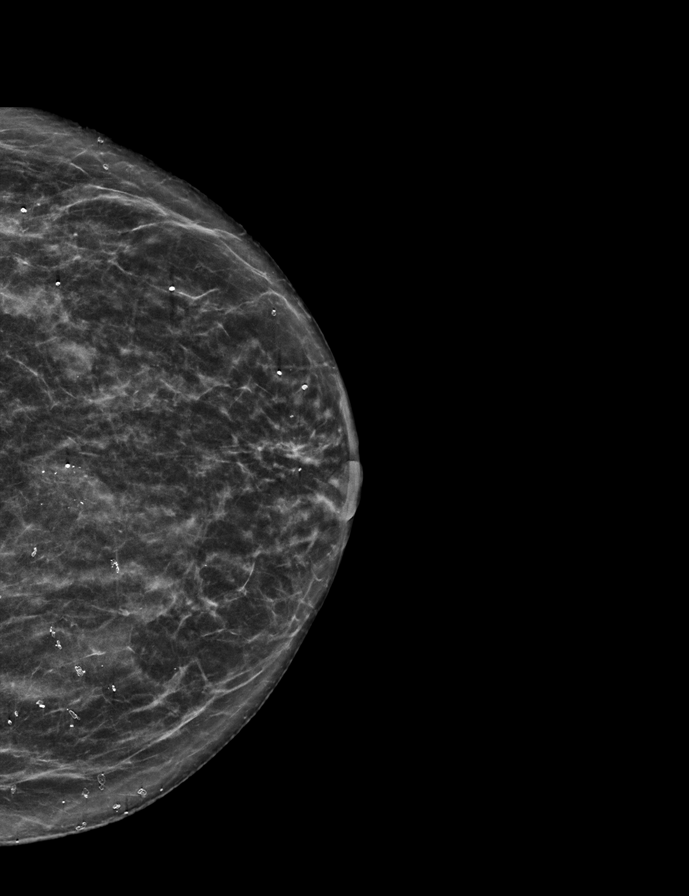

[L MLO tomo · 2 of 60 frames shown]
[frame 20/60]
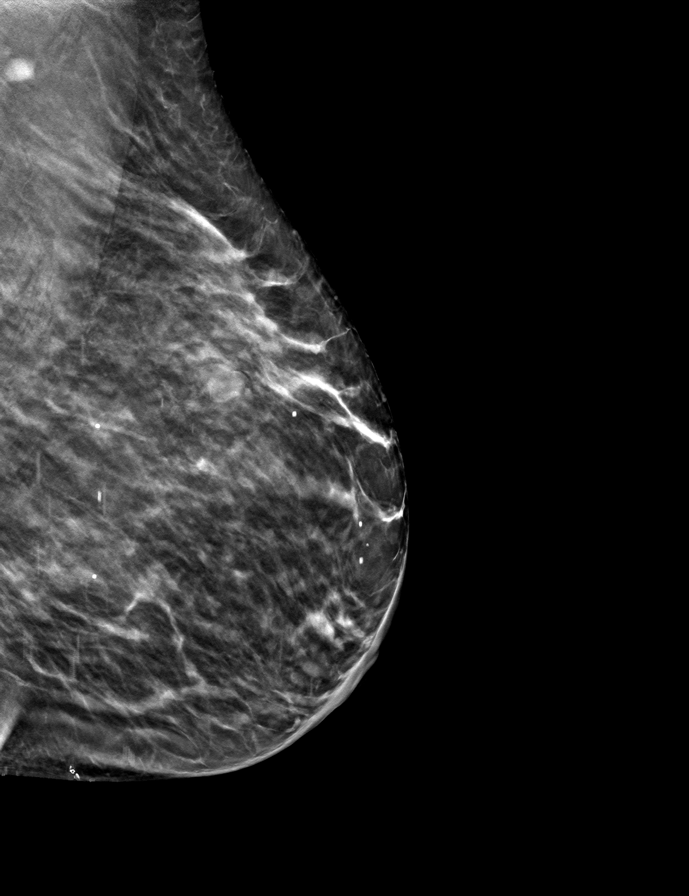
[frame 31/60]
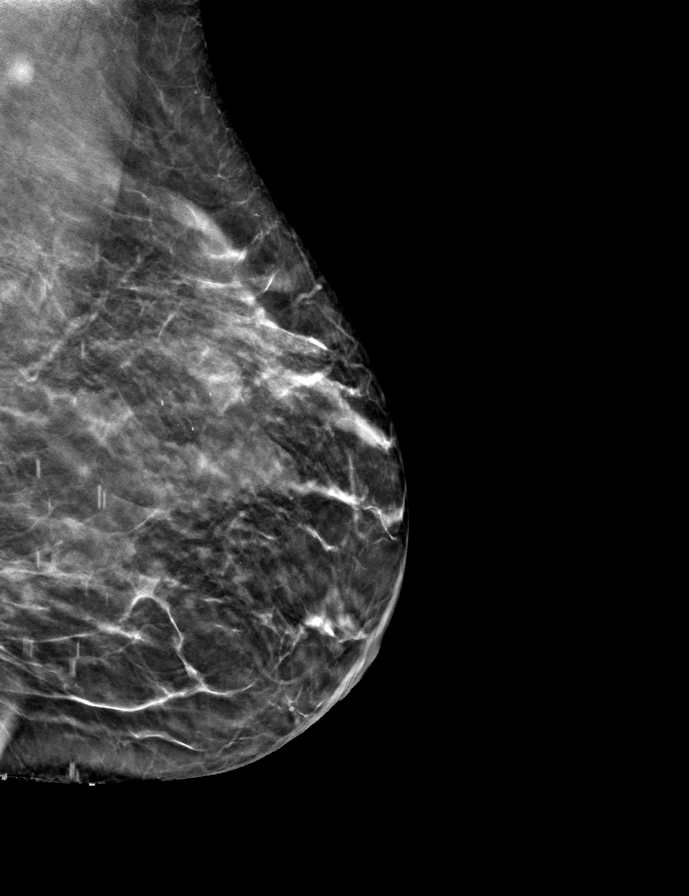

[L CC tomo · tomo slice 29/57.0]
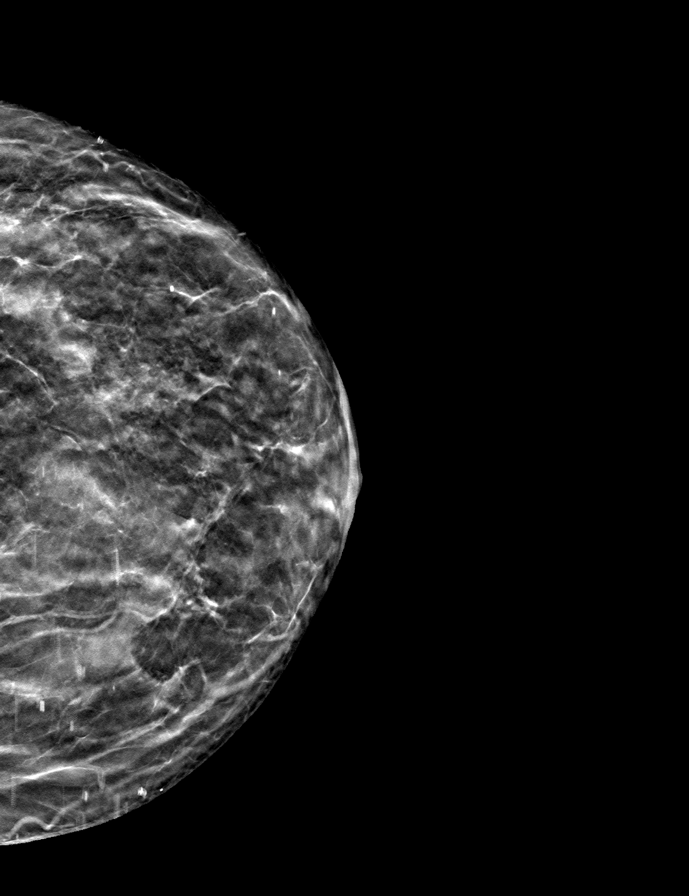

[R CC tomo · tomo slice 29/57.0]
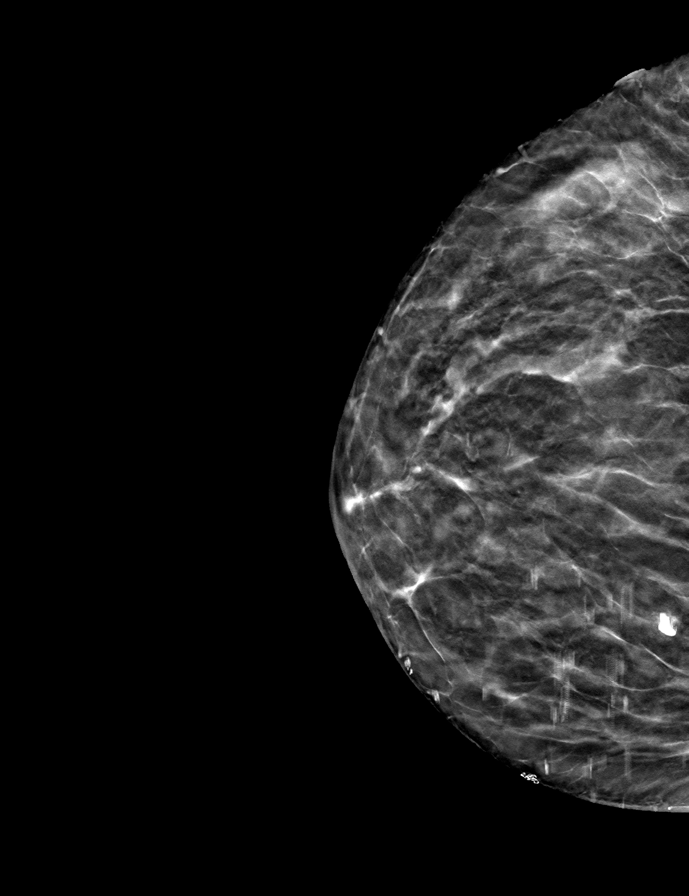

[R MLO tomo · tomo slice 31/62.0]
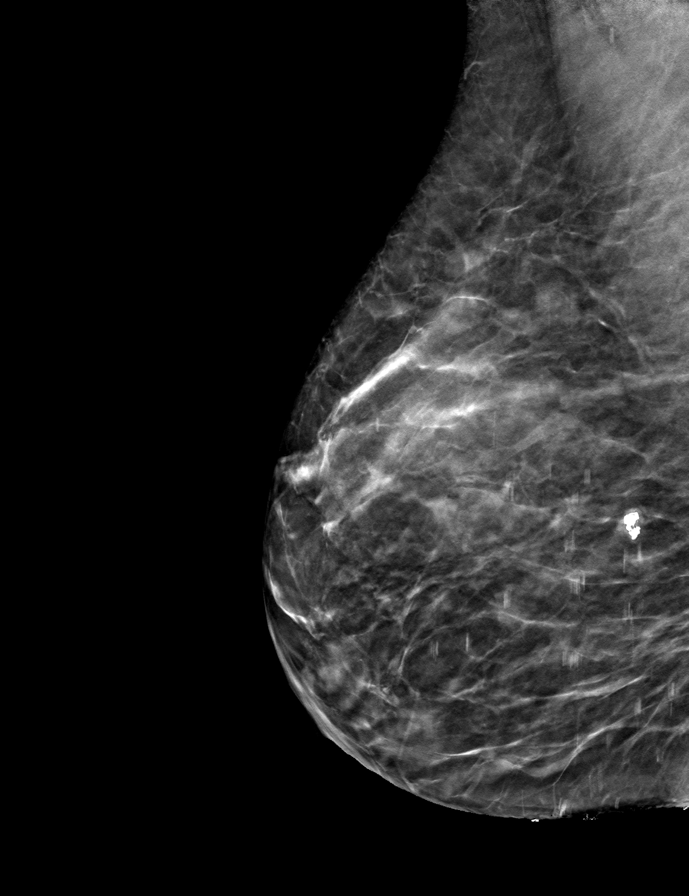

[9 of 24 positions shown; findings below may reference images not displayed]

ACR Breast Density Category c: The breast tissue is heterogeneously
dense, which may obscure small masses.
FINDINGS: There are no findings suspicious for malignancy.
IMPRESSION: No mammographic evidence of malignancy. A result letter of this
screening mammogram will be mailed directly to the patient.

RECOMMENDATION:
Screening mammogram in one year. (Code:[id number]-BC3)

BI-RADS CATEGORY  1: Negative.

## 2022-07-15 ENCOUNTER — Ambulatory Visit (INDEPENDENT_AMBULATORY_CARE_PROVIDER_SITE_OTHER): Payer: Medicare Other | Admitting: Family Medicine

## 2022-07-15 ENCOUNTER — Encounter: Payer: Self-pay | Admitting: Family Medicine

## 2022-07-15 VITALS — BP 145/82 | HR 82 | Ht 69.0 in | Wt 120.4 lb

## 2022-07-15 DIAGNOSIS — E1169 Type 2 diabetes mellitus with other specified complication: Secondary | ICD-10-CM | POA: Diagnosis present

## 2022-07-15 DIAGNOSIS — I10 Essential (primary) hypertension: Secondary | ICD-10-CM | POA: Diagnosis not present

## 2022-07-15 DIAGNOSIS — M810 Age-related osteoporosis without current pathological fracture: Secondary | ICD-10-CM

## 2022-07-15 LAB — POCT GLYCOSYLATED HEMOGLOBIN (HGB A1C): HbA1c, POC (controlled diabetic range): 8.1 % — AB (ref 0.0–7.0)

## 2022-07-15 MED ORDER — ALENDRONATE SODIUM 70 MG PO TABS
70.0000 mg | ORAL_TABLET | ORAL | 2 refills | Status: DC
Start: 2022-07-15 — End: 2022-10-14

## 2022-07-15 MED ORDER — LOSARTAN POTASSIUM 25 MG PO TABS: 25.0000 mg | ORAL_TABLET | Freq: Every day | ORAL | 0 refills | Status: DC

## 2022-07-15 MED ORDER — DULAGLUTIDE 1.5 MG/0.5ML ~~LOC~~ SOAJ: 1.5000 mg | SUBCUTANEOUS | 1 refills | Status: DC

## 2022-07-15 MED ORDER — OYSTER SHELL CALCIUM/D3 500-5 MG-MCG PO TABS: 1.0000 | ORAL_TABLET | Freq: Every day | ORAL | 0 refills | Status: DC

## 2022-07-15 NOTE — Progress Notes (Signed)
    SUBJECTIVE:   CHIEF COMPLAINT / HPI:   Type 2 Diabetes Patient is a 71 y.o. female who presents today for diabetes follow-up.  Home medications include: Trulicity 1.'5mg'$  weekly and Farxiga '10mg'$  daily. Still taking Metformin '500mg'$  daily (although most recent note from Dr Valentina Lucks recommended discontinuing Metformin) Patient reports good medication compliance overall.  Patient checks sugar at home although is unable to tell me specific numbers. No hypoglycemic symptoms.  Most recent A1Cs:  Lab Results  Component Value Date   HGBA1C 8.1 (A) 07/15/2022   HGBA1C 10.9 (A) 03/13/2022   HGBA1C 9.4 (A) 02/04/2021   Last Microalbumin, LDL, Creatinine: Lab Results  Component Value Date   LDLCALC 136 (H) 03/13/2022   CREATININE 1.34 (H) 03/13/2022    Patient is not up to date on diabetic eye. Has appointment scheduled tomorrow. Patient is up to date on diabetic foot exam.  Osteoporosis Had DEXA 05/19/22 showing T score -2.5 in lumbar spine and femoral neck. Fosamax and Oscal with D sent to her pharmacy. She never picked these up.  Renew Handicap Placard Patient has had issues with her knees giving out when walking long distances ever since she was a teenager. Was unable to play sports in high school because her knees. Has had handicap placard for at least 8 years and requests renewal.  PERTINENT  PMH / PSH: mild diabetic retinopathy, HTN, HLD, CKD III  OBJECTIVE:   BP (!) 145/82   Pulse 82   Ht '5\' 9"'$  (1.753 m)   Wt 120 lb 6.4 oz (54.6 kg)   SpO2 99%   BMI 17.78 kg/m   Gen: NAD, pleasant, able to participate in exam CV: RRR, normal S1/S2, no murmur Resp: Normal effort, lungs CTAB Extremities: no edema or cyanosis Knees: no obvious deformity or swelling. Nontender to palpation. 5/5 strength with flexion and extension. Negative anterior/posterior drawer, normal valgus/varus stress Skin: warm and dry, no rashes noted Neuro: alert, no obvious focal deficits Psych: Normal affect  and mood  ASSESSMENT/PLAN:   Type 2 diabetes mellitus ([facility]) A1c 8.1% today, which is much improved from prior (10.9%). -Continue Farxiga '10mg'$  daily and Trulicity 1.'5mg'$  weekly -Ok to discontinue Metformin as previously instructed -Follow up with Dr Valentina Lucks -Urine microalbumin obtained today -Start ARB (Losartan '25mg'$  daily) -Continue Rosuvastatin '20mg'$  daily -Patient to have eye exam tomorrow and fax results to our office -Next A1c in 3 months (end of Dec 2023)  Osteoporosis Discussed DEXA findings today. New Rx sent for alendronate and oscal with D.  Primary hypertension Blood pressure elevated today x2. Patient adamantly denies any problems with her blood pressure. Start Losartan '25mg'$  daily due to diabetes and renal protection, but will also help with blood pressure. -Lab visit in 2 weeks for BMP. Future orders placed   Handicap Placard Renewal form completed today due to bilateral knee weakness when walking long distances.  [name], MD [place]

## 2022-07-15 NOTE — Patient Instructions (Signed)
It was great to see you!  Your A1c was 8.1% today, which means your diabetes is better controlled than before, but we still have work to do. Your goal A1c is around 7.5%.   Continue your [place] and Trulicity.  You can stop your Metformin.  Follow up with Dr Valentina Lucks in ~2 weeks.   New medications: Losartan 14m daily (to help protect your kidneys). We will need to check blood work in 2 weeks. Please make a lab appointment on your way out. You can make this on the same day as your appointment with Dr KValentina Lucksfor convenience.  Please have your eye doctor do a diabetic eye exam and fax the notes to our office. [phone] our fax number.  Your bone density test shows you have osteoporosis (fragile bones that are prone to breaking). I have sent 2 medications to your pharmacy. One is called alendronate (fosamax)-- take this once weekly with food. The other is calcium and vitamin D. Take this daily.   Take care and seek immediate care sooner if you develop any concerns.  Dr. [facility]Edrick KinsFamily Medicine    Osteoporosis  Osteoporosis is when the bones get thin and weak. This can cause your bones to break (fracture) more easily. What are the causes? The exact cause of this condition is not known. What increases the risk? Having family members with this condition. Not eating enough healthy foods. Taking certain medicines. Being female. Being age 4540or older. Smoking or using other products that contain nicotine or tobacco, such as e-cigarettes or chewing tobacco. Not exercising. Being of European or Asian ancestry. Having a small body frame. What are the signs or symptoms? A broken bone might be the first sign, especially if the break results from a fall or injury that usually would not cause a bone to break. Other signs and symptoms include: Pain in the neck or low back. Being hunched over (stooped posture). Getting shorter. How is this treated? Eating more foods with more calcium  and vitamin D in them. Doing exercises. Stopping tobacco use. Limiting how much alcohol you drink. Taking medicines to slow bone loss or help make the bones stronger. Taking supplements of calcium and vitamin D every day. Taking medicines to replace chemicals in the body (hormone replacement medicines). Monitoring your levels of calcium and vitamin D. The goal of treatment is to strengthen your bones and lower your risk for a bone break. Follow these instructions at home: Eating and drinking Eat plenty of calcium and vitamin D. These nutrients are good for your bones. Good sources of calcium and vitamin D include: Some fish, such as salmon and tuna. Foods that have calcium and vitamin D added to them (fortified foods), such as some breakfast cereals. Egg yolks. Cheese. Liver.  Activity Do exercises as told by your doctor. Ask your doctor what exercises are safe for you. You should do: Exercises that make your muscles work to hold your body weight up (weight-bearing exercises). These include tai chi, yoga, and walking. Exercises to make your muscles stronger. One example is lifting weights. Lifestyle Do not drink alcohol if: Your doctor tells you not to drink. You are pregnant, may be pregnant, or are planning to become pregnant. If you drink alcohol: Limit how much you use to: 0-1 drink a day for women. 0-2 drinks a day for men. Know how much alcohol is in your drink. In the [place] one drink equals one 12 oz bottle of beer (355 mL), one 5  oz glass of wine (148 mL), or one 1 oz glass of hard liquor (44 mL). Do not smoke or use any products that contain nicotine or tobacco. If you need help quitting, ask your doctor. Preventing falls Use tools to help you move around (mobility aids) as needed. These include canes, walkers, scooters, and crutches. Keep rooms well-lit. Put away things on the floor that could make you trip. These include cords and rugs. Install safety rails on stairs.  Install grab bars in bathrooms. Use rubber mats in slippery areas, like bathrooms. Wear shoes that: Fit you well. Support your feet. Have closed toes. Have rubber soles or low heels. Tell your doctor about all of the medicines you are taking. Some medicines can make you more likely to fall. General instructions Take over-the-counter and prescription medicines only as told by your doctor. Keep all follow-up visits. Contact a doctor if: You have not been tested (screened) for osteoporosis and you are: A woman who is age 4 or older. A man who is age 57 or older. Get help right away if: You fall. You get hurt. Summary Osteoporosis happens when your bones get thin and weak. Weak bones can break (fracture) more easily. Eat plenty of calcium and vitamin D. These are good for your bones. Tell your doctor about all of the medicines that you take. This information is not intended to replace advice given to you by your health care provider. Make sure you discuss any questions you have with your health care provider. Document Revised: 03/21/2020 Document Reviewed: 03/21/2020 Elsevier Patient Education  [place].

## 2022-07-15 NOTE — Assessment & Plan Note (Signed)
Discussed DEXA findings today. New Rx sent for alendronate and oscal with D.

## 2022-07-15 NOTE — Assessment & Plan Note (Signed)
A1c 8.1% today, which is much improved from prior (10.9%). -Continue Farxiga '10mg'$  daily and Trulicity 1.'5mg'$  weekly -Ok to discontinue Metformin as previously instructed -Follow up with Dr Valentina Lucks -Urine microalbumin obtained today -Start ARB (Losartan '25mg'$  daily) -Continue Rosuvastatin '20mg'$  daily -Patient to have eye exam tomorrow and fax results to our office -Next A1c in 3 months (end of Dec 2023)

## 2022-07-15 NOTE — Assessment & Plan Note (Signed)
Blood pressure elevated today x2. Patient adamantly denies any problems with her blood pressure. Start Losartan '25mg'$  daily due to diabetes and renal protection, but will also help with blood pressure. -Lab visit in 2 weeks for BMP. Future orders placed

## 2022-07-16 LAB — MICROALBUMIN / CREATININE URINE RATIO
Creatinine, Urine: 51.5 mg/dL
Microalb/Creat Ratio: 33 mg/g creat — ABNORMAL HIGH (ref 0–29)
Microalbumin, Urine: 17.2 ug/mL

## 2022-07-17 ENCOUNTER — Ambulatory Visit: Payer: Medicare Other

## 2022-07-22 ENCOUNTER — Encounter: Payer: Self-pay | Admitting: Family Medicine

## 2022-07-22 LAB — HM DIABETES EYE EXAM

## 2022-07-29 ENCOUNTER — Ambulatory Visit (INDEPENDENT_AMBULATORY_CARE_PROVIDER_SITE_OTHER): Payer: Medicare Other | Admitting: Pharmacist

## 2022-07-29 VITALS — BP 120/79 | HR 87 | Wt 120.6 lb

## 2022-07-29 DIAGNOSIS — E1169 Type 2 diabetes mellitus with other specified complication: Secondary | ICD-10-CM | POA: Diagnosis not present

## 2022-07-29 DIAGNOSIS — E785 Hyperlipidemia, unspecified: Secondary | ICD-10-CM | POA: Diagnosis not present

## 2022-07-29 MED ORDER — DULAGLUTIDE 3 MG/0.5ML ~~LOC~~ SOAJ: 3.0000 mg | SUBCUTANEOUS | 3 refills | Status: DC

## 2022-07-29 NOTE — Patient Instructions (Addendum)
It was great to see you today!  Today we increased your Trulicity (dulaglutide) to 3 mg. You may finish your 1.5 mg pens and then start the higher dose. Please let us know if you feel this is significantly affecting your appetite.  Please pick up calcium citrate (Citrical is a brand that has Vitamin D as well) and start taking this supplement daily. Take at a different time of day than your multivitamin.  Please call and schedule your next appointment with Dr. Rock Nephew in 2-3 months.

## 2022-07-29 NOTE — Progress Notes (Signed)
Reviewed: I agree with Dr. Koval's documentation and management. 

## 2022-07-29 NOTE — Assessment & Plan Note (Addendum)
ASCVD risk - primary prevention in patient with diabetes. Last LDL is 136 not at goal of <70 mg/dL. ASCVD risk factors include hypertension, diabetes, and hyperlipidemia and 10-year ASCVD risk score of 24.3%. High intensity statin indicated. - Continue rosuvastatin 20 mg  -  Obtained direct LDL to assess control after dose increase from 5 to '20mg'$  daily  Follow-up Patient notified of LDL cholesterol which was ~ 50% reduced from baseline and current LDL was '82mg'$ /dl.   Shared with patient that LDL was much improved.    At next visit, could consider increase dose of rosuvastatin from 20 to '40mg'$  daily in attempt to reduce LDL to less than 70.  To achieve a lower LDL goal of < '55mg'$ /dl it will likely be required to add another agent.  Patient has been reluctant to take medications in the past and this management is likely best handled in person.

## 2022-07-29 NOTE — Assessment & Plan Note (Signed)
Diabetes longstanding currently uncontrolled according to A1c but recent blood sugars in the 100s. Patient is able to verbalize appropriate hypoglycemia management plan. Medication adherence appears good.  -Increased dose of Trulicity (dulaglutide) to 3 mg once patient finishes current supply of Trulicity 1.5 mg.  -Patient educated on purpose, proper use, and potential adverse effects of GI effects/nausea and appetite suppression.  -Continued Farxiga (dagapliflozin) 10 mg -Extensively discussed pathophysiology of diabetes, recommended lifestyle interventions, dietary effects on blood sugar control.  -Counseled on s/sx of and management of hypoglycemia.

## 2022-07-29 NOTE — Progress Notes (Signed)
S:     Chief Complaint  Patient presents with   Medication Management    Diabetes follow-up   Amanda Shepherd is a 71 y.o. female who presents for diabetes evaluation, education, and management.  PMH is significant for hypertension, hyperlipidemia, CKD, osteoporosis.  Patient was referred and last seen by Primary Care Provider, Dr. Rock Nephew, on 07/15/22.   At last visit, the patient's A1c was improved to 8.1% and her metformin was discontinued. Her Wilder Glade and Trulicity were continued. She was started on losartan for kidney protection and alendronate and Oscal (Calcium carbonate) for osteoporosis.   Today, patient arrives in good spirits and presents without any assistance.   Current diabetes medications include: Trulicity (dulaglutide) 1.5 mg, Farxiga (dapagliflozin) 10 mg Current hypertension medications include: losartan 25 mg Current hyperlipidemia medications include: rosuvastatin 20 mg  Patient reports adherence to taking all medications as prescribed.   Do you feel that your medications are working for you? yes Insurance coverage: BCBS  Reported home fasting blood sugars: 100s   Dairy: Patient reports having milk approx 2x/week, and cheese in food daily  O:   Review of Systems  All other systems reviewed and are negative.   Physical Exam Constitutional:      Appearance: Normal appearance.  Pulmonary:     Effort: Pulmonary effort is normal.  Neurological:     Mental Status: She is alert.  Psychiatric:        Mood and Affect: Mood normal.        Behavior: Behavior normal.        Thought Content: Thought content normal.        Judgment: Judgment normal.     Lab Results  Component Value Date   HGBA1C 8.1 (A) 07/15/2022   Vitals:   07/29/22 0838  BP: 120/79  Pulse: 87  SpO2: 100%    Lipid Panel     Component Value Date/Time   CHOL 220 (H) 03/13/2022 0947   TRIG 94 03/13/2022 0947   HDL 67 03/13/2022 0947   CHOLHDL 3.3 03/13/2022 0947   CHOLHDL 3.4  Ratio 11/18/2010 2037   CHOLHDL 3.4 Ratio 11/18/2010 2037   VLDL 12 11/18/2010 2037   VLDL 12 11/18/2010 2037   LDLCALC 136 (H) 03/13/2022 0947   LDLDIRECT 135 (H) 03/06/2013 1608    Clinical Atherosclerotic Cardiovascular Disease (ASCVD): No  The 10-year ASCVD risk score (Arnett DK, et al., 2019) is: 24.3%   Values used to calculate the score:     Age: 22 years     Sex: Female     Is Non-Hispanic African American: Yes     Diabetic: Yes     Tobacco smoker: No     Systolic Blood Pressure: 270 mmHg     Is BP treated: Yes     HDL Cholesterol: 67 mg/dL     Total Cholesterol: 220 mg/dL   A/P: Diabetes longstanding currently uncontrolled according to A1c but recent blood sugars in the 100s. Patient is able to verbalize appropriate hypoglycemia management plan. Medication adherence appears good.  -Increased dose of Trulicity (dulaglutide) to 3 mg once patient finishes current supply of Trulicity 1.5 mg.  -Patient educated on purpose, proper use, and potential adverse effects of GI effects/nausea and appetite suppression.  -Continued Farxiga (dagapliflozin) 10 mg -Extensively discussed pathophysiology of diabetes, recommended lifestyle interventions, dietary effects on blood sugar control.  -Counseled on s/sx of and management of hypoglycemia.   ASCVD risk - primary prevention in patient with diabetes.  Last LDL is 136 not at goal of <70 mg/dL. ASCVD risk factors include hypertension, diabetes, and hyperlipidemia and 10-year ASCVD risk score of 24.3%. High intensity statin indicated. - Continue rosuvastatin 20 mg  -  Obtained direct LDL to assess control after dose increase from 5 to '20mg'$  daily  Hypertension longstanding currently controlled. Blood pressure goal of <130/80  mmHg. Medication adherence good.  -Continue losartan 25 mg   Osteoporosis recently diagnosed.  - Continue alendronate 70 mg weekly - reinforced need for first AM dosing - empty stomach wait at least 30 (preferably 60)  minutes before eathing or drinking anything other than water.  - Stop OScal and start calcium citrate (OTC), take at a separate time from your multivitamin  Written patient instructions provided. Patient verbalized understanding of treatment plan.  Total time in face to face counseling 40 minutes.    Follow-up:  PCP clinic visit in 2-3 months.  Patient seen with Geraldo Docker, PharmD Candidate and Garrel Ridgel PharmD Candidate.

## 2022-07-30 LAB — LDL CHOLESTEROL, DIRECT: LDL Direct: 82 mg/dL (ref 0–99)

## 2022-07-30 NOTE — Progress Notes (Signed)
Follow-up of lab results.    Patient notified of LDL cholesterol which was ~ 50% reduced from baseline and current LDL was '82mg'$ /dl.   Shared with patient that LDL was much improved.    At next visit, could consider increase dose of rosuvastatin from 20 to '40mg'$  daily in attempt to reduce LDL to less than 70.  To achieve a lower LDL goal of < '55mg'$ /dl it will likely be required to add another agent.  Patient has been reluctant to take medications in the past and this management is likely best handled in person.    Next visit with PCP in 2-3 months.

## 2022-08-12 ENCOUNTER — Ambulatory Visit
Admission: RE | Admit: 2022-08-12 | Discharge: 2022-08-12 | Disposition: A | Discharge status: Home / Self Care | Payer: Medicare Other | Source: Ambulatory Visit | Attending: Podiatry | Admitting: Podiatry

## 2022-08-12 DIAGNOSIS — Z1231 Encounter for screening mammogram for malignant neoplasm of breast: Secondary | ICD-10-CM

## 2022-08-14 ENCOUNTER — Other Ambulatory Visit: Payer: Self-pay | Admitting: Family Medicine

## 2022-08-14 DIAGNOSIS — R928 Other abnormal and inconclusive findings on diagnostic imaging of breast: Secondary | ICD-10-CM

## 2022-08-26 ENCOUNTER — Other Ambulatory Visit: Payer: Self-pay | Admitting: Family Medicine

## 2022-08-26 ENCOUNTER — Ambulatory Visit: Admission: RE | Admit: 2022-08-26 | Payer: Medicare Other | Source: Ambulatory Visit

## 2022-08-26 ENCOUNTER — Ambulatory Visit
Admission: RE | Admit: 2022-08-26 | Discharge: 2022-08-26 | Disposition: A | Discharge status: Home / Self Care | Payer: Medicare Other | Source: Ambulatory Visit | Attending: Family Medicine | Admitting: Family Medicine

## 2022-08-26 DIAGNOSIS — N6489 Other specified disorders of breast: Secondary | ICD-10-CM

## 2022-08-26 DIAGNOSIS — R928 Other abnormal and inconclusive findings on diagnostic imaging of breast: Secondary | ICD-10-CM

## 2022-08-26 DIAGNOSIS — R921 Mammographic calcification found on diagnostic imaging of breast: Secondary | ICD-10-CM

## 2022-10-05 ENCOUNTER — Ambulatory Visit (HOSPITAL_COMMUNITY)
Admission: EM | Admit: 2022-10-05 | Discharge: 2022-10-05 | Disposition: A | Discharge status: Home / Self Care | Payer: Medicare Other | Attending: Internal Medicine | Admitting: Internal Medicine

## 2022-10-05 ENCOUNTER — Encounter (HOSPITAL_COMMUNITY): Payer: Self-pay | Admitting: *Deleted

## 2022-10-05 DIAGNOSIS — J3489 Other specified disorders of nose and nasal sinuses: Secondary | ICD-10-CM | POA: Diagnosis not present

## 2022-10-05 DIAGNOSIS — J029 Acute pharyngitis, unspecified: Secondary | ICD-10-CM | POA: Diagnosis not present

## 2022-10-05 DIAGNOSIS — J309 Allergic rhinitis, unspecified: Secondary | ICD-10-CM

## 2022-10-05 DIAGNOSIS — H6121 Impacted cerumen, right ear: Secondary | ICD-10-CM

## 2022-10-05 MED ORDER — LORATADINE 10 MG PO TABS: 10.0000 mg | ORAL_TABLET | Freq: Every day | ORAL | 1 refills | Status: DC

## 2022-10-05 NOTE — ED Triage Notes (Signed)
Pt states she has left ear pain x 2 weeks, some sore throat and congestion, also the eft eye hurts sometimes. She hasn't been taking any OTC meds for sx.

## 2022-10-05 NOTE — ED Provider Notes (Signed)
[facility]    CSN: [id number] Arrival date & time: 10/05/22  0844      History   Chief Complaint Chief Complaint  Patient presents with   Otalgia   Nasal Congestion   Sore Throat    HPI Amanda Shepherd is a 71 y.o. female.   Patient presents to urgent care for evaluation of left sided ear pain, sore throat, intermittent mild cough, and rhinorrhea for the last 2 weeks.  Left ear pain feels like a "pressure sensation".  No fever, chills, rash, nausea, vomiting, abdominal pain, headache, shortness of breath, chest pain, heart palpitations, dizziness, lightheadedness, decreased appetite, decreased oral intake, or generalized bodyaches.  Denies watery/itchy eyes.  She states "I am really not coughing that much and it does not really bother me".  She is most bothered by left-sided ear pain.  She denies tinnitus, decreased hearing from the left ear, ear infections, recent trauma/injury to the left ear and drainage from the left ear.  She has never been a smoker and denies drug use.  Denies history of chronic respiratory problems and seasonal allergic rhinitis.  She has taken Claritin in the past but does not currently take this daily.  No known sick contacts with similar symptoms.  She has not attempted use of any over-the-counter medications prior to arrival urgent care for symptoms.   Otalgia Sore Throat    Past Medical History:  Diagnosis Date   Bronchitis    Cough in adult 10/27/2013   Diabetes mellitus    Hyperlipidemia     Patient Active Problem List   Diagnosis Date Noted   Osteoporosis 06/01/2022   CKD (chronic kidney disease), stage III ([place]) 03/03/2021   Mild non proliferative diabetic retinopathy ([place]) 06/04/2014   Primary hypertension 09/22/2010   Type 2 diabetes mellitus ([place]) 02/14/2007   Hyperlipidemia associated with type 2 diabetes mellitus ([place]) 02/14/2007    Past Surgical History:  Procedure Laterality Date   ABDOMINAL HYSTERECTOMY     COLONOSCOPY   07/15/2005   at [facility]    OB History   No obstetric history on file.      Home Medications    Prior to Admission medications   Medication Sig Start Date End Date Taking? Authorizing Provider  alendronate (FOSAMAX) 70 MG tablet Take 1 tablet (70 mg total) by mouth every 7 (seven) days. Take with a full glass of water on an empty stomach. 07/15/22  Yes [name], MD  Blood Glucose Monitoring Suppl (ONE TOUCH ULTRA 2) w/Device KIT USE TO CHECK GLUCOSE BEFORE MEAL(S) AND  AT BEDTIME 03/17/22  Yes [name], MD  calcium-vitamin D (OSCAL WITH D) 500-5 MG-MCG tablet Take 1 tablet by mouth daily with breakfast. 07/15/22  Yes [name], MD  Dulaglutide 3 MG/0.5ML SOPN Inject 3 mg into the skin once a week. 07/29/22  Yes Hensel, Jamal Collin, MD  FARXIGA 10 MG TABS tablet Take 1 tablet by mouth once daily 06/23/22  Yes [name], MD  glucose blood (ONETOUCH ULTRA) test strip Use to check blood sugar up to 4 times daily 04/22/22  Yes [name], MD  Lancets (ONETOUCH DELICA PLUS NATFTD32K) MISC USE TO CHECK BLOOD SUGARS FOUR TIMES DAILY. 03/17/22  Yes [name], MD  losartan (COZAAR) 25 MG tablet Take 1 tablet (25 mg total) by mouth at bedtime. 07/15/22  Yes [name], MD  Multiple Vitamin (MULTIVITAMIN) tablet Take 1 tablet by mouth daily. Centrum Silver   Yes [provider]  rosuvastatin ([place])  20 MG tablet Take 1 tablet (20 mg total) by mouth daily. 05/18/22  Yes Hensel, Jamal Collin, MD    Family History Family History  Problem Relation Age of Onset   Hypertension Mother    Colon cancer Neg Hx     Social History Social History   Tobacco Use   Smoking status: Never   Smokeless tobacco: Never  Vaping Use   Vaping Use: Never used  Substance Use Topics   Alcohol use: No   Drug use: No     Allergies   Atorvastatin   Review of Systems Review of Systems  HENT:  Positive for ear pain.   Per HPI   Physical Exam Triage Vital  Signs ED Triage Vitals  Enc Vitals Group     BP 10/05/22 1058 (!) 148/92     Pulse Rate 10/05/22 1058 83     Resp 10/05/22 1058 18     Temp 10/05/22 1058 97.7 F (36.5 C)     Temp Source 10/05/22 1058 Oral     SpO2 10/05/22 1058 97 %     Weight --      Height --      Head Circumference --      Peak Flow --      Pain Score 10/05/22 1056 5     Pain Loc --      Pain Edu? --      Excl. in [place]? --    No data found.  Updated Vital Signs BP (!) 148/92 (BP Location: Left Arm)   Pulse 83   Temp 97.7 F (36.5 C) (Oral)   Resp 18   SpO2 97%   Visual Acuity Right Eye Distance:   Left Eye Distance:   Bilateral Distance:    Right Eye Near:   Left Eye Near:    Bilateral Near:     Physical Exam Vitals and nursing note reviewed.  Constitutional:      Appearance: She is not ill-appearing or toxic-appearing.  HENT:     Head: Normocephalic and atraumatic.     Right Ear: Hearing, ear canal and external ear normal. There is impacted cerumen.     Left Ear: Hearing, ear canal and external ear normal. Tympanic membrane is bulging.     Ears:     Comments: Left TM appears bulging without erythema.  Copious purulent material/cerumen to the right ear canal upon initial assessment.    Nose: Nose normal.     Right Turbinates: Swollen and pale.     Left Turbinates: Swollen and pale.     Mouth/Throat:     Lips: Pink.     Mouth: Mucous membranes are moist.     Pharynx: Oropharynx is clear. Uvula midline. No posterior oropharyngeal erythema or uvula swelling.     Tonsils: No tonsillar exudate or tonsillar abscesses.     Comments: Small amount of clear postnasal drainage visualized to the posterior oropharynx.  Eyes:     General: Lids are normal. Vision grossly intact. Gaze aligned appropriately.     Extraocular Movements: Extraocular movements intact.     Conjunctiva/sclera: Conjunctivae normal.  Cardiovascular:     Rate and Rhythm: Normal rate and regular rhythm.     Heart sounds: Normal  heart sounds, S1 normal and S2 normal.  Pulmonary:     Effort: Pulmonary effort is normal. No respiratory distress.     Breath sounds: Normal breath sounds and air entry.  Musculoskeletal:     Cervical back: Neck supple.  Skin:    General: Skin is warm and dry.     Capillary Refill: Capillary refill takes less than 2 seconds.     Findings: No rash.  Neurological:     General: No focal deficit present.     Mental Status: She is alert and oriented to person, place, and time. Mental status is at baseline.     Cranial Nerves: No dysarthria or facial asymmetry.  Psychiatric:        Mood and Affect: Mood normal.        Speech: Speech normal.        Behavior: Behavior normal.        Thought Content: Thought content normal.        Judgment: Judgment normal.      UC Treatments / Results  Labs (all labs ordered are listed, but only abnormal results are displayed) Labs Reviewed - No data to display  EKG   Radiology No results found.  Procedures Procedures (including critical care time)  Medications Ordered in UC Medications - No data to display  Initial Impression / Assessment and Plan / UC Course  I have reviewed the triage vital signs and the nursing notes.  Pertinent labs & imaging results that were available during my care of the patient were reviewed by me and considered in my medical decision making (see chart for details).   1.  Allergic rhinitis, rhinorrhea, sore throat Presentation is consistent with allergic rhinitis etiology versus viral URI.  Will defer imaging and viral testing based on nontoxic appearance, hemodynamically stable vital signs, and stable cardiopulmonary exam findings.  Patient to begin taking Claritin once daily to help dry up post nasal drip and fluid behind left TM causing bulging appearance. Creatinine clearance based off of most recent BMP is 34, no dosage adjustment for Claritin needed based on up to date guidelines.   2. Impacted cerumen of  right ear Ear lavage completed in clinic by nursing staff.  Nurse was able to remove some cerumen from the ear to the point where I was able to visualize the right tympanic membrane, it is normal in appearance and without evidence of infection or perforation.   Discussed physical exam and available lab work findings in clinic with patient.  Counseled patient regarding appropriate use of medications and potential side effects for all medications recommended or prescribed today. Discussed red flag signs and symptoms of worsening condition,when to call the PCP office, return to urgent care, and when to seek higher level of care in the emergency department. Patient verbalizes understanding and agreement with plan. All questions answered. Patient discharged in stable condition.    Final Clinical Impressions(s) / UC Diagnoses   Final diagnoses:  Allergic rhinitis, unspecified seasonality, unspecified trigger  Rhinorrhea  Sore throat  Impacted cerumen of right ear   Discharge Instructions   None    ED Prescriptions   None    PDMP not reviewed this encounter.   Talbot Grumbling, [place] 10/05/22 1152

## 2022-10-05 NOTE — Discharge Instructions (Addendum)
Take claritin '10mg'$  once daily to help dry up nasal drainage contributing to cough and sore throat.  This will also likely help with your left-sided ear pain.  You may take Tylenol 1000 mg every 6 hours as needed for left ear pain as well.  We cleaned out your right ear in the clinic.  Do not place any Q-tips into the right ear or left ear to clean out earwax as this will only further damage to the ear canal and push the wax into the ear canal.

## 2022-10-13 ENCOUNTER — Other Ambulatory Visit: Payer: Self-pay | Admitting: Family Medicine

## 2022-10-16 ENCOUNTER — Telehealth: Payer: Self-pay

## 2022-10-16 NOTE — Telephone Encounter (Signed)
Called patient and informed of below.  ? ?Daielle Melcher C Jayveon Convey, RN ? ?

## 2022-10-16 NOTE — Telephone Encounter (Signed)
Yes- would recommend she skip this Monday and resume on 1/8.

## 2022-10-16 NOTE — Telephone Encounter (Signed)
Patient calls nurse line regarding questions with Trulicity. She states that there was a supply issue at the pharmacy and dose was delayed. She had last injection on 12/28. She likes to take injection every Monday.   Patient is asking if she should skip this coming Monday and resume on 1/8, or how she should proceed in getting back on schedule.   Will forward to PCP.   Talbot Grumbling, RN

## 2022-10-27 ENCOUNTER — Encounter: Payer: Self-pay | Admitting: Family Medicine

## 2022-10-27 ENCOUNTER — Ambulatory Visit (INDEPENDENT_AMBULATORY_CARE_PROVIDER_SITE_OTHER): Payer: Medicare Other | Admitting: Family Medicine

## 2022-10-27 VITALS — BP 133/80 | HR 84 | Wt 127.8 lb

## 2022-10-27 DIAGNOSIS — E1169 Type 2 diabetes mellitus with other specified complication: Secondary | ICD-10-CM | POA: Diagnosis not present

## 2022-10-27 LAB — POCT GLYCOSYLATED HEMOGLOBIN (HGB A1C): HbA1c, POC (controlled diabetic range): 8.5 % — AB (ref 0.0–7.0)

## 2022-10-27 MED ORDER — METFORMIN HCL ER 500 MG PO TB24: 500.0000 mg | ORAL_TABLET | Freq: Two times a day (BID) | ORAL | 0 refills | Status: DC

## 2022-10-27 NOTE — Assessment & Plan Note (Signed)
Remains inadequately controlled.  A1c 8.5% today despite reported good adherence to medications. -Continue Trulicity '3mg'$  weekly -Continue Farxiga 10 mg daily -Restart metformin XR 500 mg BID -Check BMP today (Losartan started at last visit) -Return in 3 months for next A1c -Continue ARB, statin -UTD on foot and eye exams and urine microalbumin

## 2022-10-27 NOTE — Patient Instructions (Addendum)
It was great to see you!  Things we discussed at today's visit: - Your A1c was 8.5% today, which means your diabetes is not well-controlled. Your goal A1c is 7%.  -Continue your current medications and we will also restart metformin twice daily.  I sent a prescription to your pharmacy. -Please cut out all regular sodas and sweetened beverages (juice, gatorade, sweet tea, lemonade etc.) -Come back in 3 months (April) to recheck your A1c.  We are checking some labs. We will send you a MyChart message with the results or call if they are abnormal.   Take care and seek immediate care sooner if you develop any concerns.  Dr. [facility]

## 2022-10-27 NOTE — Progress Notes (Signed)
    SUBJECTIVE:   CHIEF COMPLAINT / HPI:   Type 2 Diabetes Patient is a 72 y.o. female who presents today for diabetes follow-up.  Home medications include: Trulicity '3mg'$  weekly, Farxiga '10mg'$  daily Patient reports good medication compliance. Did miss a few doses of Trulicity. Admits she cheats and has regular soda sometimes. Patient does not check sugar at home-states she hates having to prick herself.  Most recent A1Cs:  Lab Results  Component Value Date   HGBA1C 8.5 (A) 10/27/2022   HGBA1C 8.1 (A) 07/15/2022   HGBA1C 10.9 (A) 03/13/2022   Last Microalbumin, LDL, Creatinine: Lab Results  Component Value Date   LDLCALC 136 (H) 03/13/2022   CREATININE 1.34 (H) 03/13/2022    Patient is up to date on diabetic eye. Patient is up to date on diabetic foot exam.   PERTINENT  PMH / PSH: Osteoporosis  OBJECTIVE:   BP 133/80   Pulse 84   Wt 127 lb 12.8 oz (58 kg)   SpO2 99%   BMI 18.87 kg/m   Gen: NAD, able to participate in exam CV: RRR, normal S1/S2, no murmur Resp: Normal effort, lungs CTAB Extremities: no edema or cyanosis Skin: warm and dry, no rashes noted Neuro: alert, no obvious focal deficits  ASSESSMENT/PLAN:   Type 2 diabetes mellitus ([place]) Remains inadequately controlled.  A1c 8.5% today despite reported good adherence to medications. -Continue Trulicity '3mg'$  weekly -Continue Farxiga 10 mg daily -Restart metformin XR 500 mg BID -Check BMP today (Losartan started at last visit) -Return in 3 months for next A1c -Continue ARB, statin -UTD on foot and eye exams and urine microalbumin    [name], MD [place]

## 2022-10-28 LAB — BASIC METABOLIC PANEL
BUN/Creatinine Ratio: 27 (ref 12–28)
BUN: 40 mg/dL — ABNORMAL HIGH (ref 8–27)
CO2: 20 mmol/L (ref 20–29)
Calcium: 9.5 mg/dL (ref 8.7–10.3)
Chloride: 104 mmol/L (ref 96–106)
Creatinine, Ser: 1.5 mg/dL — ABNORMAL HIGH (ref 0.57–1.00)
Glucose: 171 mg/dL — ABNORMAL HIGH (ref 70–99)
Potassium: 4.4 mmol/L (ref 3.5–5.2)
Sodium: 142 mmol/L (ref 134–144)
eGFR: 37 mL/min/{1.73_m2} — ABNORMAL LOW (ref >59)

## 2022-11-03 ENCOUNTER — Other Ambulatory Visit: Payer: Self-pay | Admitting: Family Medicine

## 2022-11-03 DIAGNOSIS — E1169 Type 2 diabetes mellitus with other specified complication: Secondary | ICD-10-CM

## 2022-12-12 ENCOUNTER — Other Ambulatory Visit: Payer: Self-pay | Admitting: Family Medicine

## 2022-12-12 DIAGNOSIS — E1169 Type 2 diabetes mellitus with other specified complication: Secondary | ICD-10-CM

## 2022-12-31 ENCOUNTER — Telehealth: Payer: Self-pay | Admitting: Family Medicine

## 2022-12-31 NOTE — Telephone Encounter (Signed)
Contacted Amanda Shepherd to schedule their annual wellness visit. Appointment made for 01/07/2023.  Thank you,  [place] Direct dial  [phone]

## 2023-01-05 ENCOUNTER — Other Ambulatory Visit: Payer: Self-pay

## 2023-01-05 ENCOUNTER — Emergency Department (HOSPITAL_BASED_OUTPATIENT_CLINIC_OR_DEPARTMENT_OTHER)
Admission: EM | Admit: 2023-01-05 | Discharge: 2023-01-06 | Disposition: A | Discharge status: Home / Self Care | Payer: Medicare Other | Attending: Emergency Medicine | Admitting: Emergency Medicine

## 2023-01-05 DIAGNOSIS — Z1152 Encounter for screening for COVID-19: Secondary | ICD-10-CM | POA: Insufficient documentation

## 2023-01-05 DIAGNOSIS — R059 Cough, unspecified: Secondary | ICD-10-CM | POA: Diagnosis not present

## 2023-01-05 DIAGNOSIS — E119 Type 2 diabetes mellitus without complications: Secondary | ICD-10-CM | POA: Insufficient documentation

## 2023-01-05 DIAGNOSIS — R509 Fever, unspecified: Secondary | ICD-10-CM | POA: Insufficient documentation

## 2023-01-05 DIAGNOSIS — N3000 Acute cystitis without hematuria: Secondary | ICD-10-CM | POA: Insufficient documentation

## 2023-01-05 LAB — RESP PANEL BY RT-PCR (RSV, FLU A&B, COVID)  RVPGX2
Influenza A by PCR: NEGATIVE
Influenza B by PCR: NEGATIVE
Resp Syncytial Virus by PCR: NEGATIVE
SARS Coronavirus 2 by RT PCR: NEGATIVE

## 2023-01-05 MED ORDER — ACETAMINOPHEN 500 MG PO TABS
1000.0000 mg | ORAL_TABLET | Freq: Once | ORAL | Status: AC
  Administered 2023-01-05: 1000 mg via ORAL
  Filled 2023-01-05: qty 2

## 2023-01-05 NOTE — ED Triage Notes (Signed)
Denies pain. Reports taking tylenol this morning.

## 2023-01-05 NOTE — ED Triage Notes (Addendum)
Reports fever, cough, and runny nose with onset earlier today. Denies pain. Reports taking tylenol this morning for R arm pain following covid vaccine at doctors office.

## 2023-01-06 ENCOUNTER — Emergency Department (HOSPITAL_BASED_OUTPATIENT_CLINIC_OR_DEPARTMENT_OTHER): Payer: Medicare Other

## 2023-01-06 DIAGNOSIS — R509 Fever, unspecified: Secondary | ICD-10-CM | POA: Diagnosis not present

## 2023-01-06 LAB — COMPREHENSIVE METABOLIC PANEL
ALT: 17 U/L (ref 0–44)
AST: 21 U/L (ref 15–41)
Albumin: 3.9 g/dL (ref 3.5–5.0)
Alkaline Phosphatase: 82 U/L (ref 38–126)
Anion gap: 11 (ref 5–15)
BUN: 22 mg/dL (ref 8–23)
CO2: 22 mmol/L (ref 22–32)
Calcium: 9.7 mg/dL (ref 8.9–10.3)
Chloride: 102 mmol/L (ref 98–111)
Creatinine, Ser: 1.32 mg/dL — ABNORMAL HIGH (ref 0.44–1.00)
GFR, Estimated: 43 mL/min — ABNORMAL LOW (ref >60)
Glucose, Bld: 119 mg/dL — ABNORMAL HIGH (ref 70–99)
Potassium: 3.7 mmol/L (ref 3.5–5.1)
Sodium: 135 mmol/L (ref 135–145)
Total Bilirubin: 1 mg/dL (ref 0.3–1.2)
Total Protein: 7.5 g/dL (ref 6.5–8.1)

## 2023-01-06 LAB — URINALYSIS, ROUTINE W REFLEX MICROSCOPIC
Bilirubin Urine: NEGATIVE
Glucose, UA: >1000 mg/dL — AB
Hgb urine dipstick: NEGATIVE
Ketones, ur: NEGATIVE mg/dL
Leukocytes,Ua: NEGATIVE
Nitrite: POSITIVE — AB
Protein, ur: NEGATIVE mg/dL
Specific Gravity, Urine: 1.008 (ref 1.005–1.030)
pH: 5 (ref 5.0–8.0)

## 2023-01-06 LAB — CBC WITH DIFFERENTIAL/PLATELET
Abs Immature Granulocytes: 0.02 10*3/uL (ref 0.00–0.07)
Basophils Absolute: 0.1 10*3/uL (ref 0.0–0.1)
Basophils Relative: 1 %
Eosinophils Absolute: 0.1 10*3/uL (ref 0.0–0.5)
Eosinophils Relative: 1 %
HCT: 33.2 % — ABNORMAL LOW (ref 36.0–46.0)
Hemoglobin: 11.3 g/dL — ABNORMAL LOW (ref 12.0–15.0)
Immature Granulocytes: 0 %
Lymphocytes Relative: 18 %
Lymphs Abs: 1.4 10*3/uL (ref 0.7–4.0)
MCH: 30.8 pg (ref 26.0–34.0)
MCHC: 34 g/dL (ref 30.0–36.0)
MCV: 90.5 fL (ref 80.0–100.0)
Monocytes Absolute: 1.1 10*3/uL — ABNORMAL HIGH (ref 0.1–1.0)
Monocytes Relative: 14 %
Neutro Abs: 5.3 10*3/uL (ref 1.7–7.7)
Neutrophils Relative %: 66 %
Platelets: 248 10*3/uL (ref 150–400)
RBC: 3.67 MIL/uL — ABNORMAL LOW (ref 3.87–5.11)
RDW: 12.5 % (ref 11.5–15.5)
WBC: 7.9 10*3/uL (ref 4.0–10.5)
nRBC: 0 % (ref 0.0–0.2)

## 2023-01-06 LAB — APTT: aPTT: 27 seconds (ref 24–36)

## 2023-01-06 LAB — PROTIME-INR
INR: 1.1 (ref 0.8–1.2)
Prothrombin Time: 14.2 seconds (ref 11.4–15.2)

## 2023-01-06 LAB — LACTIC ACID, PLASMA: Lactic Acid, Venous: 0.6 mmol/L (ref 0.5–1.9)

## 2023-01-06 MED ORDER — CEPHALEXIN 500 MG PO CAPS: 500.0000 mg | ORAL_CAPSULE | Freq: Three times a day (TID) | ORAL | 0 refills | Status: AC

## 2023-01-06 MED ORDER — SODIUM CHLORIDE 0.9 % IV SOLN
1.0000 g | Freq: Once | INTRAVENOUS | Status: AC
  Administered 2023-01-06: 1 g via INTRAVENOUS
  Filled 2023-01-06: qty 10

## 2023-01-06 MED ORDER — SODIUM CHLORIDE 0.9 % IV BOLUS
1000.0000 mL | Freq: Once | INTRAVENOUS | Status: AC
  Administered 2023-01-06: 1000 mL via INTRAVENOUS

## 2023-01-06 NOTE — Discharge Instructions (Signed)
You were evaluated in the Emergency Department and after careful evaluation, we did not find any emergent condition requiring admission or further testing in the hospital.  Your exam/testing today is overall reassuring.  Symptoms likely due to a urinary tract infection.  Take the Keflex antibiotic as directed.  Plenty of fluids and rest.  Please return to the Emergency Department if you experience any worsening of your condition.   Thank you for allowing [place] to be a part of your care.

## 2023-01-06 NOTE — ED Provider Notes (Addendum)
[facility] Emergency Department Provider Note MRN:  [id number]  Arrival date & time: 01/06/23     Chief Complaint   Cough   History of Present Illness   Amanda Shepherd is a 72 y.o. year-old female with a history of diabetes, hyperlipidemia presenting to the ED with chief complaint of cough.  Patient felt rundown today, low energy.  Had to care for 3 children today and so was on her feet a lot.  Began coughing midday.  Thought maybe it was due to the pollen outside.  No abdominal pain, no vomiting or diarrhea, no chest pain, no shortness of breath.  Review of Systems  A thorough review of systems was obtained and all systems are negative except as noted in the HPI and PMH.   Patient's Health History    Past Medical History:  Diagnosis Date   Bronchitis    Cough in adult 10/27/2013   Diabetes mellitus    Hyperlipidemia     Past Surgical History:  Procedure Laterality Date   ABDOMINAL HYSTERECTOMY     COLONOSCOPY  07/15/2005   at [place]    Family History  Problem Relation Age of Onset   Hypertension Mother    Colon cancer Neg Hx     Social History   Socioeconomic History   Marital status: Single    Spouse name: Not on file   Number of children: Not on file   Years of education: Not on file   Highest education level: Not on file  Occupational History   Not on file  Tobacco Use   Smoking status: Never   Smokeless tobacco: Never  Vaping Use   Vaping Use: Never used  Substance and Sexual Activity   Alcohol use: No   Drug use: No   Sexual activity: Never  Other Topics Concern   Not on file  Social History Narrative   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    Social Determinants of Health   Financial Resource Strain: Not on file  Food Insecurity: Not on file  Transportation Needs: Not on file  Physical Activity: Not on file  Stress: Not on file  Social  Connections: Not on file  Intimate Partner Violence: Not on file     Physical Exam   Vitals:   01/06/23 0150 01/06/23 0200  BP:  (!) 111/57  Pulse: 96 93  Resp: 19 19  Temp:    SpO2: 95% 94%    CONSTITUTIONAL: Well-appearing, NAD NEURO/PSYCH:  Alert and oriented x 3, no focal deficits EYES:  eyes equal and reactive ENT/NECK:  no LAD, no JVD CARDIO: Regular rate, well-perfused, normal S1 and S2 PULM:  CTAB no wheezing or rhonchi GI/GU:  non-distended, non-tender MSK/SPINE:  No gross deformities, no edema SKIN:  no rash, atraumatic   *Additional and/or pertinent findings included in MDM below  Diagnostic and Interventional Summary    EKG Interpretation  Date/Time:  Wednesday January 06 2023 01:03:14 EDT Ventricular Rate:  100 PR Interval:  145 QRS Duration: 73 QT Interval:  349 QTC Calculation: 451 R Axis:   78 Text Interpretation: Sinus tachycardia Probable anterior infarct, old Confirmed by Gerlene Fee [phone]) on 01/06/2023 1:10:17 AM       Labs Reviewed  COMPREHENSIVE METABOLIC PANEL - Abnormal; Notable for the following components:      Result Value   Glucose, Bld 119 (*)    Creatinine, Ser  1.32 (*)    GFR, Estimated 43 (*)    All other components within normal limits  CBC WITH DIFFERENTIAL/PLATELET - Abnormal; Notable for the following components:   RBC 3.67 (*)    Hemoglobin 11.3 (*)    HCT 33.2 (*)    Monocytes Absolute 1.1 (*)    All other components within normal limits  URINALYSIS, ROUTINE W REFLEX MICROSCOPIC - Abnormal; Notable for the following components:   Glucose, UA >1,000 (*)    Nitrite POSITIVE (*)    Bacteria, UA MANY (*)    All other components within normal limits  RESP PANEL BY RT-PCR (RSV, FLU A&B, COVID)  [id number]  CULTURE, BLOOD (ROUTINE X 2)  CULTURE, BLOOD (ROUTINE X 2)  LACTIC ACID, PLASMA  PROTIME-INR  APTT  LACTIC ACID, PLASMA    DG Chest Port 1 View  Final Result      Medications  acetaminophen (TYLENOL) tablet 1,000  mg (1,000 mg Oral Given 01/05/23 2241)  sodium chloride 0.9 % bolus 1,000 mL (0 mLs Intravenous Stopped 01/06/23 0216)  cefTRIAXone (ROCEPHIN) 1 g in sodium chloride 0.9 % 100 mL IVPB (0 g Intravenous Stopped 01/06/23 0249)     Procedures  /  Critical Care Procedures  ED Course and Medical Decision Making  Initial Impression and Ddx Patient arrives febrile up to 102.8 with heart rate of 120.  She is however very well-appearing, no hypoxia, no increased work of breathing, reassuring blood pressure.  Suspicious for viral illness however in triage her COVID and flu and RSV testing is negative.  And so differential diagnosis is broad and to include pulmonary sepsis, UTI.  Past medical/surgical history that increases complexity of ED encounter: Diabetes  Interpretation of Diagnostics I personally reviewed the EKG and my interpretation is as follows: Sinus rhythm  Labs overall reassuring with no significant blood count or electrolyte disturbance.  Urinalysis is nitrate positive, suspicious for infection.  Patient Reassessment and Ultimate Disposition/Management     On multiple reassessment patient is well-appearing without complaints.  Vital signs overall reassuring, occasionally a soft blood pressure that when repeated is normal.  She is warm and well-perfused.  She is fully awake and alert, pleasant to talk to.  Given the UTI with the fever and initial tachycardia, and patient's age and comorbidities, hospitalization is considered but the utility is felt to be low given her well-appearing nature.  Overall highly doubt sepsis at this time.  Normal lactate, no leukocytosis.  Admission offered but patient definitely wants to go home.  Feels well, confident that she will recover on antibiotics at home.  Given a dose of ceftriaxone here in the emergency department and continues to do well with further observation.  She has a sister that she lives with that we will be able to check on her over the next few  days, strict return precautions.  Patient management required discussion with the following services or consulting groups:  None  Complexity of Problems Addressed Acute illness or injury that poses threat of life of bodily function  Additional Data Reviewed and Analyzed Further history obtained from: Further history from spouse/family member  Additional Factors Impacting ED Encounter Risk Prescriptions and Consideration of hospitalization  Barth Kirks. [place]onia Small, [place] [email or web address]  Final Clinical Impressions(s) / ED Diagnoses     ICD-10-CM   1. Fever, unspecified fever cause  R50.9     2. Acute cystitis without hematuria  N30.00  ED Discharge Orders          Ordered    cephALEXin (KEFLEX) 500 MG capsule  3 times daily        01/06/23 0300             Discharge Instructions Discussed with and Provided to Patient:     Discharge Instructions      You were evaluated in the Emergency Department and after careful evaluation, we did not find any emergent condition requiring admission or further testing in the hospital.  Your exam/testing today is overall reassuring.  Symptoms likely due to a urinary tract infection.  Take the Keflex antibiotic as directed.  Plenty of fluids and rest.  Please return to the Emergency Department if you experience any worsening of your condition.   Thank you for allowing [place] to be a part of your care.       Maudie Flakes, MD 01/06/23 T[place] Mould    Maudie Flakes, MD 01/06/23 [phone]

## 2023-01-06 NOTE — Progress Notes (Addendum)
I connected with  [name] on 01/07/2023 by a audio enabled telemedicine application and verified that I am speaking with the correct person using two identifiers.  Patient Location: Home  Provider Location: Home Office  I discussed the limitations of evaluation and management by telemedicine. The patient expressed understanding and agreed to proceed.;e  Subjective:   Amanda Shepherd is a 72 y.o. female who presents for an Initial Medicare Annual Wellness Visit.  Review of Systems    Per HPI unless specifically indicated below.  Cardiac Risk Factors include: advanced age (>33men, >39 women);female gender, Primary hypertension, and Hyperlipidemia.           Objective:       01/06/2023    3:00 AM 01/06/2023    2:00 AM 01/06/2023    1:50 AM  Vitals with BMI  Systolic 94 [id number]   Diastolic 59 57   Pulse 88 93 96    Today's Vitals   01/07/23 0921  PainSc: 0-No pain   There is no height or weight on file to calculate BMI.     01/07/2023    9:27 AM 01/05/2023   10:37 PM 01/05/2023   10:36 PM 07/15/2022    9:02 AM 04/22/2022    8:30 AM 03/03/2021   10:35 AM 02/04/2021    1:59 PM  Advanced Directives  Does Patient Have a Medical Advance Directive? No No No No No No No  Would patient like information on creating a medical advance directive? No - Patient declined   No - Patient declined No - Patient declined No - Patient declined No - Patient declined    Current Medications (verified) Outpatient Encounter Medications as of 01/07/2023  Medication Sig   alendronate (FOSAMAX) 70 MG tablet TAKE 1 TABLET BY MOUTH ONCE A WEEK WITH  A  FULL  GLASS  OF  WATER  ON  AN  EMPTY  STOMACH   Blood Glucose Monitoring Suppl (ONE TOUCH ULTRA 2) w/Device KIT USE TO CHECK GLUCOSE BEFORE MEAL(S) AND  AT BEDTIME   calcium-vitamin D (OSCAL WITH D) 500-5 MG-MCG tablet Take 1 tablet by mouth daily with breakfast.   cephALEXin (KEFLEX) 500 MG capsule Take 1 capsule (500 mg total) by mouth 3 (three) times  daily for 7 days.   Dulaglutide (TRULICITY) 3 0000000 SOPN Inject 3 mg into the skin once a week.   FARXIGA 10 MG TABS tablet Take 1 tablet by mouth once daily   glucose blood (ONETOUCH ULTRA) test strip Use to check blood sugar up to 4 times daily   Lancets (ONETOUCH DELICA PLUS 123XX123) MISC USE TO CHECK BLOOD SUGARS FOUR TIMES DAILY.   losartan (COZAAR) 25 MG tablet TAKE 1 TABLET BY MOUTH AT BEDTIME   metFORMIN (GLUCOPHAGE-XR) 500 MG 24 hr tablet Take 1 tablet (500 mg total) by mouth 2 (two) times daily with a meal.   Multiple Vitamin (MULTIVITAMIN) tablet Take 1 tablet by mouth daily. Centrum Silver   rosuvastatin (CRESTOR) 20 MG tablet Take 1 tablet (20 mg total) by mouth daily.   loratadine (CLARITIN) 10 MG tablet Take 1 tablet (10 mg total) by mouth daily. (Patient not taking: Reported on 01/07/2023)   No facility-administered encounter medications on file as of 01/07/2023.    Allergies (verified) Atorvastatin   History: Past Medical History:  Diagnosis Date   Bronchitis    Cough in adult 10/27/2013   Diabetes mellitus    Hyperlipidemia    Past Surgical History:  Procedure Laterality Date  ABDOMINAL HYSTERECTOMY     COLONOSCOPY  07/15/2005   at [place] History  Problem Relation Age of Onset   Hypertension Mother    Colon cancer Neg Hx    Social History   Socioeconomic History   Marital status: Single    Spouse name: Not on file   Number of children: 2   Years of education: Not on file   Highest education level: Not on file  Occupational History   Occupation: Retired  Tobacco Use   Smoking status: Never   Smokeless tobacco: Never  Vaping Use   Vaping Use: Never used  Substance and Sexual Activity   Alcohol use: No   Drug use: No   Sexual activity: Never  Other Topics Concern   Not on file  Social History Narrative   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    Social  Determinants of Health   Financial Resource Strain: Low Risk  (01/07/2023)   Overall Financial Resource Strain (CARDIA)    Difficulty of Paying Living Expenses: Not hard at all  Food Insecurity: No Food Insecurity (01/07/2023)   Hunger Vital Sign    Worried About Running Out of Food in the Last Year: Never true    Ran Out of Food in the Last Year: Never true  Transportation Needs: No Transportation Needs (01/07/2023)   PRAPARE - Hydrologist (Medical): No    Lack of Transportation (Non-Medical): No  Physical Activity: Inactive (01/07/2023)   Exercise Vital Sign    Days of Exercise per Week: 0 days    Minutes of Exercise per Session: 0 min  Stress: No Stress Concern Present (01/07/2023)   [place]    Feeling of Stress : Not at all  Social Connections: Moderately Isolated (01/07/2023)   Social Connection and Isolation Panel [NHANES]    Frequency of Communication with Friends and Family: More than three times a week    Frequency of Social Gatherings with Friends and Family: Once a week    Attends Religious Services: More than 4 times per year    Active Member of [employer] or Organizations: No    Attends Music therapist: Never    Marital Status: Divorced    Tobacco Counseling Counseling given: No   Clinical Intake:  Pre-visit preparation completed: No  Pain : No/denies pain Pain Score: 0-No pain     Nutritional Status: BMI <19  Underweight Nutritional Risks: None Diabetes: Yes CBG done?: No Did pt. bring in CBG monitor from home?: No  How often do you need to have someone help you when you read instructions, pamphlets, or other written materials from your doctor or pharmacy?: 1 - Never  Diabetic?Nutrition Risk Assessment:  Has the patient had any N/V/D within the last 2 months?  No  Does the patient have any non-healing wounds?  No  Has the patient had any  unintentional weight loss or weight gain?  Yes   Diabetes:  Is the patient diabetic?  Yes  If diabetic, was a CBG obtained today?  No  Did the patient bring in their glucometer from home?  No  How often do you monitor your CBG's? Not checking blood sugar .   Financial Strains and Diabetes Management:  Are you having any financial strains with the device, your supplies or your medication? No .  Does  the patient want to be seen by Chronic Care Management for management of their diabetes?  No  Would the patient like to be referred to a Nutritionist or for Diabetic Management?  No   Diabetic Exams:  Diabetic Eye Exam: Completed 07/23/2023 Diabetic Foot Exam: Completed 02/26/2023   Interpreter Needed?: No  Information entered by :: Donnie Mesa, cMA   Activities of Daily Living    01/07/2023    9:15 AM  In your present state of health, do you have any difficulty performing the following activities:  Hearing? 1  Vision? [place].  Difficulty concentrating or making decisions? 1  Walking or climbing stairs? 1  Comment Rt knee  Dressing or bathing? 0  Doing errands, shopping? 0    Patient Care Team: [name], MD as PCP - General (Family Medicine)  Indicate any recent Medical Services you may have received from other than [facility] providers in the past year (date may be approximate).     Assessment:   This is a routine wellness examination for [place].  Hearing/Vision screen Denies any hearing issues. Denies any vision changes. Annual Eye Exam. [facility]    Dietary issues and exercise activities discussed: Current Exercise Habits: The patient does not participate in regular exercise at present, Exercise limited by: None identified   Goals Addressed   None    Depression Screen    01/07/2023    9:13 AM 10/27/2022    9:02 AM 07/15/2022    9:01 AM 04/22/2022    8:30 AM 03/13/2022    8:36 AM 03/03/2021   10:35 AM 02/04/2021    1:58 PM   PHQ 2/9 Scores  PHQ - 2 Score 0 0 0 0 0 0 0  PHQ- 9 Score   0 0 0 0 0    Fall Risk    01/07/2023    9:14 AM 07/15/2022    9:01 AM 02/04/2021    1:58 PM 03/25/2018    1:51 PM 01/25/2018    9:57 AM  Fall Risk   Falls in the past year? 1 1 1  No No  Number falls in past yr: 1 1 0    Injury with Fall? 1 0 1    Risk for fall due to : Other (Comment)  History of fall(s)    Risk for fall due to: Comment slipped because of the slippy bottom shoes she had on      Follow up Falls evaluation completed  Education provided;Falls prevention discussed      FALL RISK PREVENTION PERTAINING TO THE HOME:  Any stairs in or around the home? Yes  If so, are there any without handrails? No  Home free of loose throw rugs in walkways, pet beds, electrical cords, etc? Yes  Adequate lighting in your home to reduce risk of falls? Yes   ASSISTIVE DEVICES UTILIZED TO PREVENT FALLS:  Life alert? No  Use of a cane, walker or w/c? No  Grab bars in the bathroom? No  Shower chair or bench in shower? No  Elevated toilet seat or a handicapped toilet? No   TIMED UP AND GO:  Was the test performed? Unable to perform, virtual appointment  Cognitive Function:        01/07/2023    9:19 AM  6CIT Screen  What Year? 0 points  What month? 0 points  What time? 0 points  Count back from 20 0 points  Months in reverse 0 points  Repeat phrase  2 points  Total Score 2 points    Immunizations Immunization History  Administered Date(s) Administered   Influenza Whole 09/16/2006, 08/06/2007   PFIZER Comirnaty(Gray Top)Covid-19 Tri-Sucrose Vaccine 07/24/2020, 03/12/2021   PFIZER(Purple Top)SARS-COV-2 Vaccination 11/27/2019, 12/22/2019   Pfizer Covid-19 Vaccine Bivalent Booster 18yrs & up 09/09/2021   Pfizer Fall 2023 Covid-19 Vaccine 69mos thru 22yrs  01/04/2023   Pneumococcal Conjugate-13 01/25/2018   Pneumococcal Polysaccharide-23 04/02/2020   Td 10/19/1997, 07/20/2007   Tdap 04/22/2022   Zoster Recombinat  (Shingrix) 03/12/2021, 09/09/2021    TDAP status: Up to date  Flu Vaccine status: Due, Education has been provided regarding the importance of this vaccine. Advised may receive this vaccine at local pharmacy or Health Dept. Aware to provide a copy of the vaccination record if obtained from local pharmacy or Health Dept. Verbalized acceptance and understanding.  Pneumococcal vaccine status: Up to date  Covid-19 vaccine status: Information provided on how to obtain vaccines.   Qualifies for Shingles Vaccine? Yes   Zostavax completed Yes   Shingrix Completed?: Yes  Screening Tests Health Maintenance  Topic Date Due   COVID-19 Vaccine (6 - 2023-24 season) 01/04/2023   INFLUENZA VACCINE  01/17/2023 (Originally 05/19/2022)   MAMMOGRAM  02/24/2023   FOOT EXAM  03/14/2023   HEMOGLOBIN A1C  04/27/2023   Diabetic kidney evaluation - Urine ACR  07/16/2023   OPHTHALMOLOGY EXAM  07/23/2023   Diabetic kidney evaluation - eGFR measurement  01/06/2024   Medicare Annual Wellness (AWV)  01/07/2024   COLONOSCOPY (Pts 45-5yrs Insurance coverage will need to be confirmed)  09/24/2024   DTaP/Tdap/Td (4 - Td or Tdap) 04/22/2032   Pneumonia Vaccine 26+ Years old  Completed   DEXA SCAN  Completed   Hepatitis C Screening  Completed   Zoster Vaccines- Shingrix  Completed   HPV VACCINES  Aged Out    Health Maintenance  Health Maintenance Due  Topic Date Due   COVID-19 Vaccine (6 - 2023-24 season) 01/04/2023    Colorectal cancer screening: Type of screening: Colonoscopy. Completed 09/24/2021. Repeat every 3 years  Mammogram status: Completed 02/25/2022. Repeat every year  DEXA Scan: 05/19/2022  Lung Cancer Screening: (Low Dose CT Chest recommended if Age 49-80 years, 30 pack-year currently smoking OR have quit w/in 15years.) does not qualify.   Lung Cancer Screening Referral: not applicable   Additional Screening:  Hepatitis C Screening: does qualify; Completed 06/07/2017  Vision  Screening: Recommended annual ophthalmology exams for early detection of glaucoma and other disorders of the eye. Is the patient up to date with their annual eye exam?  Yes  Who is the provider or what is the name of the office in which the patient attends annual eye exams? [facility]  If pt is not established with a provider, would they like to be referred to a provider to establish care? No .   Dental Screening: Recommended annual dental exams for proper oral hygiene  Community Resource Referral / Chronic Care Management: CRR required this visit?  No   CCM required this visit?  No      Plan:     I have personally reviewed and noted the following in the patient's chart:   Medical and social history Use of alcohol, tobacco or illicit drugs  Current medications and supplements including opioid prescriptions. Patient is not currently taking opioid prescriptions. Functional ability and status Nutritional status Physical activity Advanced directives List of other physicians Hospitalizations, surgeries, and ER visits in previous 12 months Vitals Screenings to include  cognitive, depression, and falls Referrals and appointments  In addition, I have reviewed and discussed with patient certain preventive protocols, quality metrics, and best practice recommendations. A written personalized care plan for preventive services as well as general preventive health recommendations were provided to patient.    Ms. Reddic , Thank you for taking time to come for your Medicare Wellness Visit. I appreciate your ongoing commitment to your health goals. Please review the following plan we discussed and let me know if I can assist you in the future.   These are the goals we discussed:  Goals      HEMOGLOBIN A1C < 8        This is a list of the screening recommended for you and due dates:  Health Maintenance  Topic Date Due   COVID-19 Vaccine (6 - 2023-24 season) 01/04/2023   Flu  Shot  01/17/2023*   Mammogram  02/24/2023   Complete foot exam   03/14/2023   Hemoglobin A1C  04/27/2023   Yearly kidney health urinalysis for diabetes  07/16/2023   Eye exam for diabetics  07/23/2023   Yearly kidney function blood test for diabetes  01/06/2024   Medicare Annual Wellness Visit  01/07/2024   Colon Cancer Screening  09/24/2024   DTaP/Tdap/Td vaccine (4 - Td or Tdap) 04/22/2032   Pneumonia Vaccine  Completed   DEXA scan (bone density measurement)  Completed   Hepatitis C Screening: USPSTF Recommendation to screen - Ages 51-79 yo.  Completed   Zoster (Shingles) Vaccine  Completed   HPV Vaccine  Aged Out  *Topic was postponed. The date shown is not the original due date.     Wilson Singer, [place]   01/07/2023   Nurse Notes: Approximately 30 minute Non-Face -To-Face Medicare Wellness Visit        I have reviewed this visit and agree with the documentation.  [place]

## 2023-01-06 NOTE — Patient Instructions (Signed)

## 2023-01-07 ENCOUNTER — Ambulatory Visit (INDEPENDENT_AMBULATORY_CARE_PROVIDER_SITE_OTHER): Payer: Medicare Other

## 2023-01-07 DIAGNOSIS — Z0000 Encounter for general adult medical examination without abnormal findings: Secondary | ICD-10-CM | POA: Diagnosis not present

## 2023-01-11 LAB — CULTURE, BLOOD (ROUTINE X 2)
Culture: NO GROWTH
Culture: NO GROWTH
Special Requests: ADEQUATE
Special Requests: ADEQUATE

## 2023-01-12 ENCOUNTER — Telehealth: Payer: Self-pay

## 2023-01-12 NOTE — Telephone Encounter (Signed)
     Patient  visit on 3/20  at [place]   Have you been able to follow up with your primary care physician? Yes   The patient was or was not able to obtain any needed medicine or equipment. Yes   Are there diet recommendations that you are having difficulty following? Na   Patient expresses understanding of discharge instructions and education provided has no other needs at this time.  Yes     [facility] [phone] [address], [place], [place] [address] Phone: [phone] Email: [email or web address]

## 2023-01-13 ENCOUNTER — Telehealth: Payer: Self-pay | Admitting: Pharmacist

## 2023-01-13 NOTE — Progress Notes (Signed)
[facility])  [place] Team   01/13/2023  LAVILLA STEINBRUNNER [birth year] [id number]  Reason for referral: Medication assistance with Trulicity and Farxiga  Referral source: [facility] RN Current insurance: Traditional Medicare  Outreach:  Successful telephone call with Ms. Thornton Papas.  HIPAA identifiers verified.   Plan: Patient requests a call back later today.   Thank you for allowing [facility] pharmacy to be a part of this patient's care.   Loretha Brasil, PharmD [place] Clinical Pharmacist Direct Dial: [phone]

## 2023-01-13 NOTE — Progress Notes (Signed)
[facility])  [place] Team   01/13/2023  RUSSELL TEANEY 1951-08-15 [id number]  Reason for referral: Medication assistance with Trulicity and Farxiga  Referral source: [facility] RN Current insurance: Traditional Medicare  Outreach:  Successful telephone call with Ms. Thornton Papas.  HIPAA identifiers verified.   Medication Assistance Findings:  At this time patient is above the threshold to qualify for [facility] patient assistance. Ms. Leidecker would qualify for Trulicity patient assistance however [facility] is not accepting any new applications. We did discuss the potential to change to Ozempic. Patient is not open to changing to Ozempic at this time due to concerns for weight loss. Patient was appreciative of my call.   Extra Help:  Not eligible for Extra Help Low Income Subsidy based on reported income and assets  Additional medication assistance options reviewed with patient as warranted:  No other options identified  Plan: Will close [facility] pharmacy case as no further medication needs identified at this time.  Am happy to assist in the future as needed. Patient has my phone number and will reach out to me if she needs to in the future.   Thank you for allowing [facility] pharmacy to be a part of this patient's care.   Loretha Brasil, PharmD [place] Clinical Pharmacist Direct Dial: [phone]

## 2023-01-18 ENCOUNTER — Encounter: Payer: Self-pay | Admitting: Pharmacist

## 2023-01-18 ENCOUNTER — Encounter (INDEPENDENT_AMBULATORY_CARE_PROVIDER_SITE_OTHER): Payer: Medicare Other | Admitting: Pharmacist

## 2023-01-18 ENCOUNTER — Other Ambulatory Visit: Payer: Self-pay

## 2023-01-18 VITALS — Wt 123.0 lb

## 2023-01-18 DIAGNOSIS — E1169 Type 2 diabetes mellitus with other specified complication: Secondary | ICD-10-CM

## 2023-01-18 LAB — POCT GLYCOSYLATED HEMOGLOBIN (HGB A1C): HbA1c, POC (controlled diabetic range): 8.1 % — AB (ref 0.0–7.0)

## 2023-01-18 NOTE — Assessment & Plan Note (Signed)
-  Patient provided verbal consent to participate in the study. Consent documented in electronic medical record.  -Provided education on Libre 3 CGM. Collaborated to ensure Elenor Legato 3 app was downloaded on patient's phone. Educated on how to place sensor every 14 days, patient placed first sensor correctly and verbalized understanding of use, removal, and how to place next sensor. Discussed alarms. 8 sensors provided for a 3 month supply. Educated to contact the office if the sensor falls off early and replacements are needed before their next [employer].  Diabetes diagnosed in 2008. Patient is able to verbalize appropriate hypoglycemia management plan. Medication adherence appears good. Control is suboptimal due to elevated A1c of 8.1%, above goal of <7% -Continued GLP-1 Trulicity (dulaglutide) 3 mg weekly.  -Continued SGLT2-I Farxiga (dapagliflozin) 10 mg daily. Counseled on sick day rules. -Continued metformin-XR 500 mg BID. -Patient will return in 2 weeks to apply new sensor and review collected data. -Extensively discussed pathophysiology of diabetes, recommended lifestyle interventions, dietary effects on blood sugar control.

## 2023-01-18 NOTE — Research (Signed)
S:     Chief Complaint  Patient presents with   Medication Management    T2DM   72 y.o. female who presents for diabetes evaluation, education, and management in the context of the LIBERATE Study.   PMH is significant for T2DM.  Patient was referred and last seen by Primary Care Provider, Dr. Rock Nephew, on 10/27/22.  At last visit, metformin 500 mg BID was restarted to help with T2DM control.   Today, patient arrives in good spirits and presents without any assistance.  Current diabetes medications include: Trulicity (dulaglutide) 3 mg weekly, Farxiga (dapagliflozin) 10 mg daily, metformin-XR 500 mg BID Current hypertension medications include: Losartan 25 mg daily Current hyperlipidemia medications include: Rosuvastatin 20 mg  Insurance coverage: Medicare  Patient denies hypoglycemic events.  Patient denies nocturia (nighttime urination).  Patient denies neuropathy (nerve pain). Patient denies visual changes. Patient denies self foot exams.    Within the past 12 months, did you worry whether your food would run out before you got money to buy more? no Within the past 12 months, did the food you bought run out, and you didn't have money to get more? no   O:   Review of Systems  All other systems reviewed and are negative.   Physical Exam Constitutional:      Appearance: Normal appearance.  Neurological:     Mental Status: She is alert.  Psychiatric:        Mood and Affect: Mood normal.        Behavior: Behavior normal.        Thought Content: Thought content normal.        Judgment: Judgment normal.     Lab Results  Component Value Date   HGBA1C 8.5 (A) 10/27/2022    Lipid Panel     Component Value Date/Time   CHOL 220 (H) 03/13/2022 0947   TRIG 94 03/13/2022 0947   HDL 67 03/13/2022 0947   CHOLHDL 3.3 03/13/2022 0947   CHOLHDL 3.4 Ratio 11/18/2010 2037   CHOLHDL 3.4 Ratio 11/18/2010 2037   VLDL 12 11/18/2010 2037   VLDL 12 11/18/2010 2037   LDLCALC  136 (H) 03/13/2022 0947   LDLDIRECT 82 07/29/2022 1038   LDLDIRECT 135 (H) 03/06/2013 1608    Clinical Atherosclerotic Cardiovascular Disease (ASCVD): No  The 10-year ASCVD risk score (Arnett DK, et al., 2019) is: 16.9%   Values used to calculate the score:     Age: 89 years     Sex: Female     Is Non-Hispanic African American: Yes     Diabetic: Yes     Tobacco smoker: No     Systolic Blood Pressure: 94 mmHg     Is BP treated: Yes     HDL Cholesterol: 67 mg/dL     Total Cholesterol: 220 mg/dL     A/P:  LIBERATE Study:  -Patient provided verbal consent to participate in the study. Consent documented in electronic medical record.  -Provided education on Libre 3 CGM. Collaborated to ensure Elenor Legato 3 app was downloaded on patient's phone. Educated on how to place sensor every 14 days, patient placed first sensor correctly and verbalized understanding of use, removal, and how to place next sensor. Discussed alarms. 8 sensors provided for a 3 month supply. Educated to contact the office if the sensor falls off early and replacements are needed before their next [employer].  Diabetes diagnosed in 2008. Patient is able to verbalize appropriate hypoglycemia management plan. Medication adherence appears  good. Control is suboptimal due to elevated A1c of 8.1%, above goal of <7% -Continued GLP-1 Trulicity (dulaglutide) 3 mg weekly.  -Continued SGLT2-I Farxiga (dapagliflozin) 10 mg daily. Counseled on sick day rules. -Continued metformin-XR 500 mg BID. -Patient will return in 2 weeks to apply new sensor and review collected data. -Extensively discussed pathophysiology of diabetes, recommended lifestyle interventions, dietary effects on blood sugar control.  -Counseled on s/sx of and management of hypoglycemia.  -Next A1c anticipated 04/19/23.    Written patient instructions provided. Patient verbalized understanding of treatment plan.  Total time in face to face counseling 45 minutes.     Follow-up:  Pharmacist 02/01/23. Patient seen with Gena Fray, PharmD PGY-1 Pharmacy Resident and Estelle June, PharmD Candidate.

## 2023-01-18 NOTE — Patient Instructions (Addendum)
Thanks for coming in today  Start using your Elenor Legato to help you see what your blood sugars are doing throughout the day.   Return in 2 weeks for follow-up to discuss your reports and potentially any change we would make to your medications.   Sensor Application If using the App, you can tap Help in the Main Menu to access an in-app tutorial on applying a Sensor. See below for instructions on how to download the app. Apply Sensors only on the back of your upper arm. If placed in other areas, the Sensor may not function properly and could give you inaccurate readings. Avoid areas with scars, moles, stretch marks, or lumps.   Select an area of skin that generally stays flat during your normal daily activities (no bending or folding). Choose a site that is at least 1 inch (2.5 cm) away from any injection sites. To prevent discomfort or skin irritation, you should select a different site other than the one most recently used. Wash application site using a plain soap, dry, and then clean with an alcohol wipe. This will help remove any oily residue that may prevent the sensor from sticking properly. Allow site to air dry before proceeding. Note: The area MUST be clean and dry, or the Sensor may not stay on for the full wear duration specified by your Sensor insert. 4. Unscrew the cap from the Sensor Applicator and set the cap aside.  5. Place the Sensor Applicator over the prepared site and push down firmly to apply the Sensor to your body. 6. Gently pull the Sensor Applicator away from your body. The Sensor should now be attached to your skin. 7. Make sure the Sensor is secure after application. Put the cap back on the Sensor Applicator. Discard the used Engineer, civil (consulting) according to local regulations.  What If My Sensor Falls Off or What If My Sensor Isn't Working? Call [place] Team at [phone] Available 7 days a week from 8AM-8PM EST, excluding holidays If yo have multiple  sensors fall off prior to 14 days of use, contact [place] at [phone]   The App Download the [place] 3 App in your phone's app store   Load the app and select get started now Create an account  Tap scan new sensor Follow the prompts on the screen. If your sensor does not sync, try moving your phone slowly around the sensor. Phone cases may affect scanning. This will be the only time you have to scan the sensor until you apply a new sensor in 14 days.  There will be a 60 minute start up period until the app will display your glucose reading   How To Share Your Readings With [place] Once in the app, go to settings -> connected apps -> LibreView -> Enter Practice ID -> [id number]

## 2023-01-25 ENCOUNTER — Other Ambulatory Visit: Payer: Self-pay | Admitting: Family Medicine

## 2023-01-25 DIAGNOSIS — E1169 Type 2 diabetes mellitus with other specified complication: Secondary | ICD-10-CM

## 2023-01-26 ENCOUNTER — Other Ambulatory Visit: Payer: Self-pay | Admitting: Family Medicine

## 2023-01-28 ENCOUNTER — Telehealth: Payer: Self-pay

## 2023-01-28 NOTE — Telephone Encounter (Signed)
Error

## 2023-01-28 NOTE — Telephone Encounter (Signed)
LIBERATE Study  Patient completed first study visit for the LIBERATE CGM Study. Contacted patient to discuss CGM tolerability. Confirmed HIPAA identifiers.   Could not reach patient via phone. Confirmed that CGM has been transmitting information.   Second study visit on 02/01/23.    Jerry Caras, PharmD PGY1 Pharmacy Resident   01/28/2023 2:16 PM

## 2023-02-01 ENCOUNTER — Ambulatory Visit (INDEPENDENT_AMBULATORY_CARE_PROVIDER_SITE_OTHER): Payer: Medicare Other | Admitting: Pharmacist

## 2023-02-01 VITALS — Wt 123.0 lb

## 2023-02-01 DIAGNOSIS — E1169 Type 2 diabetes mellitus with other specified complication: Secondary | ICD-10-CM | POA: Diagnosis not present

## 2023-02-01 MED ORDER — FLUTICASONE PROPIONATE 50 MCG/ACT NA SUSP: 1.0000 | Freq: Every day | NASAL | 2 refills | Status: DC

## 2023-02-01 NOTE — Assessment & Plan Note (Signed)
Diabetes longstanding currently. Patient is able to verbalize appropriate hypoglycemia management plan. Medication adherence appears good. Control is optimal due to CGM data displaying glucose values in target range and GMI of 6.9%. -Continued GLP-1 Trulicity (dulaglutide) 3 mg weekly -Continued SGLT2-I Farxiga (dapagliflozin) 10 mg daily. Counseled on sick day rules. -Continued metformin-XR 500 mg BID.  -Removed Libre 3 sensor, collaborated with patient to allow her to discontinue sensors for 3 weeks, rely on finger pricks to measure blood sugar, then reassess at future appointment on best method to monitor glucose when her new phone is available.  -Extensively discussed pathophysiology of diabetes, recommended lifestyle interventions, dietary effects on blood sugar control.

## 2023-02-01 NOTE — Progress Notes (Signed)
Reviewed and agree with Dr Koval's plan.   

## 2023-02-01 NOTE — Progress Notes (Signed)
S:     Chief Complaint  Patient presents with   Medication Management    T2DM/LIBERATE F/U   72 y.o. female who presents for diabetes evaluation, education, and management.  PMH is significant for T2DM.  Patient was referred and last seen by Primary Care Provider, Dr. Anner Crete, on 10/27/22.  At last visit, patient was enrolled in LIBERATE study and a Freestyle Libre 3 sensor was applied onto her.   Today, patient arrives in good spirits and presents without any assistance. Patient is a participant in the LIBERATE study. Patient reports frustration with the frequency of alarms from the [place] 3 app.  She also reports an issue with her phone requiring her phone to be "replace" over the next two weeks (stating she will be without a phone for several days).   Current diabetes medications include: Farxiga (dapagliflozin) 10 mg daily, Trulicity (dulaglutide) 3 mg weekly, metformin-XR 500 mg BID Current hypertension medications include: Losartan 20 mg daily Current hyperlipidemia medications include: Rosuvastatin 20 mg daily  Patient reports adherence to taking all medications as prescribed.   Do you feel that your medications are working for you? yes Have you been experiencing any side effects to the medications prescribed? no Do you have any problems obtaining medications due to transportation or finances? no Insurance coverage: Medicare  Patient denies hypoglycemic events.  Patient denies nocturia (nighttime urination).  Patient denies neuropathy (nerve pain). Patient denies visual changes. Patient denies self foot exams.   Within the past 12 months, did you worry whether your food would run out before you got money to buy more? no Within the past 12 months, did the food you bought run out, and you didn't have money to get more? no  O:   Review of Systems  All other systems reviewed and are negative.   Physical Exam Constitutional:      Appearance: Normal appearance.   Neurological:     Mental Status: She is alert.  Psychiatric:        Mood and Affect: Mood normal.        Behavior: Behavior normal.        Thought Content: Thought content normal.        Judgment: Judgment normal.     Freestyle Libre 3 CGM Download:  % Time CGM is active: 96% Average Glucose: 152 mg/dL Glucose Management Indicator: 6.9%  Glucose Variability: 26.4% (goal <36%) Time in Goal:  - Time in range 70-180: 79% - Time above range: 21% (19% High 181-250 mg/dL, 2% Very high >409 mg/dL) - Time below range: 0%   Lab Results  Component Value Date   HGBA1C 8.1 (A) 01/18/2023    Lipid Panel     Component Value Date/Time   CHOL 220 (H) 03/13/2022 0947   TRIG 94 03/13/2022 0947   HDL 67 03/13/2022 0947   CHOLHDL 3.3 03/13/2022 0947   CHOLHDL 3.4 Ratio 11/18/2010 2037   CHOLHDL 3.4 Ratio 11/18/2010 2037   VLDL 12 11/18/2010 2037   VLDL 12 11/18/2010 2037   LDLCALC 136 (H) 03/13/2022 0947   LDLDIRECT 82 07/29/2022 1038   LDLDIRECT 135 (H) 03/06/2013 1608    Clinical Atherosclerotic Cardiovascular Disease (ASCVD): No  The 10-year ASCVD risk score (Arnett DK, et al., 2019) is: 16.9%   Values used to calculate the score:     Age: 65 years     Sex: Female     Is Non-Hispanic African American: Yes     Diabetic: Yes  Tobacco smoker: No     Systolic Blood Pressure: 94 mmHg     Is BP treated: Yes     HDL Cholesterol: 67 mg/dL     Total Cholesterol: 220 mg/dL   A/P: Diabetes longstanding currently. Patient is able to verbalize appropriate hypoglycemia management plan. Medication adherence appears good. Control is optimal due to CGM data displaying glucose values in target range and GMI of 6.9%. -Continued GLP-1 Trulicity (dulaglutide) 3 mg weekly -Continued SGLT2-I Farxiga (dapagliflozin) 10 mg daily. Counseled on sick day rules. -Continued metformin-XR 500 mg BID.  -Removed Libre 3 sensor, collaborated with patient to allow her to discontinue sensors for 3 weeks,  rely on finger pricks to measure blood sugar, then reassess at future appointment on best method to monitor glucose when her new phone is available.  -Extensively discussed pathophysiology of diabetes, recommended lifestyle interventions, dietary effects on blood sugar control.  -Counseled on s/sx of and management of hypoglycemia.  -Next A1c anticipated 04/19/23.   Written patient instructions provided. Patient verbalized understanding of treatment plan.  Total time in face to face counseling 35 minutes.    Follow-up:  Pharmacist 02/22/23.  Jerry Caras, PharmD PGY-1 Pharmacy Resident and Revonda Standard, PharmD Candidate.

## 2023-02-01 NOTE — Patient Instructions (Addendum)
It was great seeing you today!  Your goal blood sugar is 80-130 before eating and less than 180 after eating.  Medication Changes: Begin Flonase (fluticasone) 50 mcg/act 1 spray into each nostril daily  Monitor blood sugars at home and keep a log (glucometer or piece of paper) to bring with you to your next visit.  Keep up the good work with diet and exercise. Aim for a diet full of vegetables, fruit and lean meats (chicken, [place], fish). Try to limit salt intake by eating fresh or frozen vegetables (instead of canned), rinse canned vegetables prior to cooking and do not add any additional salt to meals.

## 2023-02-04 ENCOUNTER — Ambulatory Visit (INDEPENDENT_AMBULATORY_CARE_PROVIDER_SITE_OTHER): Payer: Medicare Other | Admitting: Student

## 2023-02-04 VITALS — BP 110/79 | HR 90 | Temp 97.7°F | Ht 66.0 in | Wt 119.2 lb

## 2023-02-04 DIAGNOSIS — J302 Other seasonal allergic rhinitis: Secondary | ICD-10-CM | POA: Diagnosis present

## 2023-02-04 MED ORDER — ALENDRONATE SODIUM 70 MG PO TABS: ORAL_TABLET | ORAL | 0 refills | Status: DC

## 2023-02-04 MED ORDER — AZELASTINE HCL 0.1 % NA SOLN: 1.0000 | Freq: Two times a day (BID) | NASAL | 0 refills | Status: AC

## 2023-02-04 MED ORDER — CETIRIZINE HCL 10 MG PO TABS
10.0000 mg | ORAL_TABLET | Freq: Every day | ORAL | 4 refills | Status: AC | PRN
  Filled 2023-05-06: qty 30, 30d supply, fill #0

## 2023-02-04 NOTE — Progress Notes (Signed)
  SUBJECTIVE:   CHIEF COMPLAINT / HPI:   Cough and congestion Has been coughing up mucous for last 3 weeks, and has been feeling congested. Notes the pollen bothers her, and her allergies have been worse this year. Hasn't been using her Claritin, but has been using some cough/cold medicine. Patient note's she's using Flonase w/o any relief. Denies any fever, SOB, or wheezing. Denies any facial pressure/pain.   PERTINENT  PMH / PSH:    Patient Care Team: Maury Dus, MD as PCP - General (Family Medicine) OBJECTIVE:  BP 110/79   Pulse 90   Temp 97.7 F (36.5 C)   Ht  (1.676 m)   Wt 119 lb 3.2 oz (54.1 kg)   SpO2 100%   BMI 19.24 kg/m  Physical Exam Constitutional:      General: She is not in acute distress.    Appearance: Normal appearance. She is not ill-appearing.  HENT:     Head:     Comments: No TTP overlying cheeks or forehead Cardiovascular:     Rate and Rhythm: Normal rate and regular rhythm.     Pulses: Normal pulses.     Heart sounds: Normal heart sounds. No murmur heard.    No friction rub. No gallop.  Pulmonary:     Effort: Pulmonary effort is normal. No respiratory distress.     Breath sounds: Normal breath sounds. No stridor. No wheezing, rhonchi or rales.  Neurological:     Mental Status: She is alert.      ASSESSMENT/PLAN:  Seasonal allergies Assessment & Plan: Patient notes that she's had cough and congestion for last 3 wks w/ the increase in pollen. Has been using Flonase w/o relief, and some OTC cough meds. Patient denies any fevers, SOB, wheezing, or facial pressure, making PNA, Sinus Infection, or asthma less likely. Patient likely suffering from allergies, as she report's hx of allergies and says they are worse this year. Patient has not been using a Claritin. Will start new allergy meds, w/ second nasal spar as Flonase alone does not appear to be enough. -D/c Claritin -Zyrtec 10 mg daily  Can double for short time (1 month) if no  relief -Flonase daily -Azelastine nasal spray  Can use for short time (1-2 months)  Not to be used at same time as Flonase    Other orders -     Cetirizine HCl; Take 1 tablet (10 mg total) by mouth daily as needed for allergies.  Dispense: 30 tablet; Refill: 4 -     Alendronate Sodium; TAKE 1 TABLET BY MOUTH ONCE A WEEK WITH A FULL GLASS OF WATER ON AN EMPTY STOMACH.  Dispense: 4 tablet; Refill: 0 -     Azelastine HCl; Place 1 spray into both nostrils 2 (two) times daily. Use in each nostril as directed  Dispense: 30 mL; Refill: 0   No follow-ups on file. Bess Kinds, MD 02/04/2023, 1:24 PM PGY-2, [facility]

## 2023-02-04 NOTE — Assessment & Plan Note (Signed)
Patient notes that she's had cough and congestion for last 3 wks w/ the increase in pollen. Has been using Flonase w/o relief, and some OTC cough meds. Patient denies any fevers, SOB, wheezing, or facial pressure, making PNA, Sinus Infection, or asthma less likely. Patient likely suffering from allergies, as she report's hx of allergies and says they are worse this year. Patient has not been using a Claritin. Will start new allergy meds, w/ second nasal spar as Flonase alone does not appear to be enough. -D/c Claritin -Zyrtec 10 mg daily  Can double for short time (1 month) if no relief -Flonase daily -Azelastine nasal spray  Can use for short time (1-2 months)  Not to be used at same time as [employer]

## 2023-02-04 NOTE — Patient Instructions (Addendum)
It was great to see you! Thank you for allowing me to participate in your care!  Our plans for today:  - Zyrtec 10 mg daily  Can double up if not working after a week. Can double for 1 month - Flonase 2 sprays each nostril daily - Azelastine spray, 1 spray each nostril twice a day  Use this for about a month or so, until allergy season finishes  Use at different time than the Flonase spray - Make follow up appointment in 2 weeks if not better    Take care and seek immediate care sooner if you develop any concerns.   Dr. Bess Kinds, MD [facility]

## 2023-02-10 ENCOUNTER — Ambulatory Visit: Payer: Self-pay | Admitting: *Deleted

## 2023-02-10 NOTE — Telephone Encounter (Signed)
Summary: med management   Stated has been sick with sinus allergies. She opened her box of TRULICITY and had 3 tubes left. She stated she did not realize she had missed 3 weeks until today. May have last taken it on the 11th.  Pt is asking what to do . Seeking clinical advice.           Chief Complaint: missed taking trulicity doses weekly since 01/28/23 Symptoms: denies sx. Reports she has had sinus allergies and now taking medications to treat and just forgot to take weekly trulicity on Mondays since 01/28/23. Just found glucose meter and will check to see what glucose is Frequency: 01/28/23 Pertinent Negatives: Patient denies sx of hyperglycemia.  Disposition: ED /[] Urgent Care (no appt availability in office) / Appointment(In office/virtual)/  [facility]/ Home Care/ Refused Recommended Disposition /[] [facility]/  Follow-up with PCP Additional Notes:   Recommended to f/u with PCP. Patient report office closed early on Wed. Recommended to go to mobile bus and gave address to check blood glucose. Then patient reports she found her meter and is going to check glucose. Recommended to call back if glucose greater than 200. Patient would like to know when she should restart trulicity since she missed this Monday again. Recommended if she takes today she will need to switch her day of week to take medication to Wednesday . Patient would like a call from PCP before taking. Recommended to call pharmacist as well.     Reason for Disposition  [1] Caller has medicine question about med NOT prescribed by PCP AND [2] triager unable to answer question (e.g., compatibility with other med, storage)  Answer Assessment - Initial Assessment Questions 1. NAME of MEDICINE: "What medicine(s) are you calling about?"     trulicity 2. QUESTION: "What is your question?" (e.g., double dose of medicine, side effect)     When should I restart trulicity? Missed doses since  01/28/23 3. PRESCRIBER: "Who prescribed the medicine?" Reason: if prescribed by specialist, call should be referred to that group.     PCP, pharmacist 4. SYMPTOMS: "Do you have any symptoms?" If Yes, ask: "What symptoms are you having?"  "How bad are the symptoms (e.g., mild, moderate, severe)     Denies sx other than sinus allergies  5. PREGNANCY:  "Is there any chance that you are pregnant?" "When was your last menstrual period?"     na  Protocols used: Medication Question Call-A-AH

## 2023-02-12 ENCOUNTER — Telehealth: Payer: Self-pay | Admitting: *Deleted

## 2023-02-12 NOTE — Telephone Encounter (Signed)
Per referral voicemail: patient needs to speak with her provider about next steps on taking her trulicity due to missed dosages.  See note from 02/10/23.  Will forward to both MD and pharmacy.  Kofi Murrell,CMA

## 2023-02-12 NOTE — Telephone Encounter (Signed)
Attempted to contact patient multiple times.   No answer X 3  It would be appropriate to reinitiate at the current dose at this time.   If patient returns call, instruct to reinitiate currently available dose.

## 2023-02-15 NOTE — Telephone Encounter (Signed)
Reviewed and agree with Dr Koval's plan.   

## 2023-02-15 NOTE — Telephone Encounter (Signed)
Contacted patient RE Trulicity dosing.    She reports she has restarted 4/25 taking her Trulicity.  She denies any GI side effects or intolerance.   Instructed to continue taking Trulicity on 5/2 then to keep appointment with me on 02/22/2023.     Patient reports slight improvement with nasal congestion/allergy symptoms.

## 2023-02-22 ENCOUNTER — Other Ambulatory Visit: Payer: Self-pay

## 2023-02-22 ENCOUNTER — Ambulatory Visit: Payer: Medicare Other | Admitting: Pharmacist

## 2023-02-22 DIAGNOSIS — E1169 Type 2 diabetes mellitus with other specified complication: Secondary | ICD-10-CM

## 2023-02-22 MED ORDER — TRULICITY 3 MG/0.5ML ~~LOC~~ SOAJ: 3.0000 mg | SUBCUTANEOUS | 0 refills | Status: DC

## 2023-02-25 ENCOUNTER — Other Ambulatory Visit: Payer: Self-pay | Admitting: Family Medicine

## 2023-02-25 DIAGNOSIS — E1169 Type 2 diabetes mellitus with other specified complication: Secondary | ICD-10-CM

## 2023-02-26 ENCOUNTER — Other Ambulatory Visit: Payer: Self-pay | Admitting: Family Medicine

## 2023-02-26 ENCOUNTER — Ambulatory Visit
Admission: RE | Admit: 2023-02-26 | Discharge: 2023-02-26 | Disposition: A | Discharge status: Home / Self Care | Payer: Medicare Other | Source: Ambulatory Visit | Attending: Family Medicine | Admitting: Family Medicine

## 2023-02-26 DIAGNOSIS — R921 Mammographic calcification found on diagnostic imaging of breast: Secondary | ICD-10-CM

## 2023-02-26 DIAGNOSIS — N6489 Other specified disorders of breast: Secondary | ICD-10-CM

## 2023-03-03 ENCOUNTER — Ambulatory Visit
Admission: RE | Admit: 2023-03-03 | Discharge: 2023-03-03 | Disposition: A | Discharge status: Home / Self Care | Payer: Medicare Other | Source: Ambulatory Visit | Attending: Family Medicine | Admitting: Family Medicine

## 2023-03-03 ENCOUNTER — Telehealth: Payer: Self-pay

## 2023-03-03 DIAGNOSIS — R921 Mammographic calcification found on diagnostic imaging of breast: Secondary | ICD-10-CM

## 2023-03-03 DIAGNOSIS — E1169 Type 2 diabetes mellitus with other specified complication: Secondary | ICD-10-CM

## 2023-03-03 HISTORY — PX: BREAST BIOPSY: SHX20

## 2023-03-03 NOTE — Telephone Encounter (Signed)
Patient calls nurse line reporting problems with obtaining Trulicity.   She reports her last dose was on 5/9. She reports she is due for next injection tomorrow.   She reports "all pharmacies" are out of Trulicity 3mg . Reports nationwide back order.   Patient is requesting an alternative medication at this time.  Will forward to PCP.

## 2023-03-04 ENCOUNTER — Other Ambulatory Visit: Payer: Self-pay | Admitting: Family Medicine

## 2023-03-04 MED ORDER — TRULICITY 1.5 MG/0.5ML ~~LOC~~ SOAJ: 3.0000 mg | SUBCUTANEOUS | 0 refills | Status: DC

## 2023-03-04 NOTE — Telephone Encounter (Signed)
I have sent prescription for 1.5mg  dose of Trulicity in hopes that a local pharmacy has this in stock.   If not, can consider alternate GLP-1 in the meantime although these may be on back order as well.  Also sent message to pharmacy staff for additional recommendations.   Maury Dus, MD PGY-3, [facility]

## 2023-03-05 ENCOUNTER — Other Ambulatory Visit: Payer: Self-pay | Admitting: Family Medicine

## 2023-03-05 ENCOUNTER — Ambulatory Visit
Admission: RE | Admit: 2023-03-05 | Discharge: 2023-03-05 | Disposition: A | Discharge status: Home / Self Care | Payer: Medicare Other | Source: Ambulatory Visit | Attending: Family Medicine | Admitting: Family Medicine

## 2023-03-05 ENCOUNTER — Telehealth: Payer: Self-pay | Admitting: Hematology and Oncology

## 2023-03-05 DIAGNOSIS — D0512 Intraductal carcinoma in situ of left breast: Secondary | ICD-10-CM

## 2023-03-05 NOTE — Telephone Encounter (Signed)
Called patient's pharmacy-- 1.5mg  dose on back order as well.  Dr. Raymondo Band- do you happen to know if any of our [facility] pharmacies have Trulicity in stock?

## 2023-03-05 NOTE — Telephone Encounter (Signed)
Spoke to patient to confirm upcoming afternoon [facility] clinic appointment on 5/29, paperwork will be sent via mail.   Gave location and time, also informed patient that the surgeon's office would be calling as well to get information from them similar to the packet that they will be receiving so make sure to do both.  Reminded patient that all providers will be coming to the clinic to see them HERE and if they had any questions to not hesitate to reach back out to myself or their navigators. 

## 2023-03-08 ENCOUNTER — Other Ambulatory Visit: Payer: Self-pay | Admitting: Family Medicine

## 2023-03-08 ENCOUNTER — Other Ambulatory Visit: Payer: Self-pay

## 2023-03-08 ENCOUNTER — Other Ambulatory Visit (HOSPITAL_COMMUNITY): Payer: Self-pay

## 2023-03-08 DIAGNOSIS — R921 Mammographic calcification found on diagnostic imaging of breast: Secondary | ICD-10-CM

## 2023-03-08 DIAGNOSIS — E1169 Type 2 diabetes mellitus with other specified complication: Secondary | ICD-10-CM

## 2023-03-08 MED ORDER — TRULICITY 3 MG/0.5ML ~~LOC~~ SOAJ
3.0000 mg | SUBCUTANEOUS | 0 refills | Status: DC
  Filled 2023-03-08: qty 2, 28d supply, fill #0
  Filled 2023-04-05: qty 2, 28d supply, fill #1
  Filled 2023-05-06: qty 2, 28d supply, fill #2

## 2023-03-08 NOTE — Addendum Note (Signed)
Addended by: Maury Dus on: 03/08/2023 03:03 PM   Modules accepted: Orders

## 2023-03-08 NOTE — Telephone Encounter (Signed)
Patient updated.

## 2023-03-08 NOTE — Telephone Encounter (Signed)
Trulicity sent to [facility]. They have 3mg  dose in stock.  Please let patient know.   Maury Dus, MD PGY-3, [facility]

## 2023-03-12 ENCOUNTER — Ambulatory Visit
Admission: RE | Admit: 2023-03-12 | Discharge: 2023-03-12 | Disposition: A | Discharge status: Home / Self Care | Payer: Medicare Other | Source: Ambulatory Visit | Attending: Family Medicine | Admitting: Family Medicine

## 2023-03-12 DIAGNOSIS — R921 Mammographic calcification found on diagnostic imaging of breast: Secondary | ICD-10-CM

## 2023-03-12 HISTORY — PX: BREAST BIOPSY: SHX20

## 2023-03-16 ENCOUNTER — Encounter: Payer: Self-pay | Admitting: *Deleted

## 2023-03-16 DIAGNOSIS — D0512 Intraductal carcinoma in situ of left breast: Secondary | ICD-10-CM | POA: Insufficient documentation

## 2023-03-16 NOTE — Progress Notes (Signed)
Radiation Oncology         [phone] ________________________________  Name: Amanda Shepherd        MRN: [id number]  Date of Service: 03/17/2023 DOB: [birth year]  Amanda Shepherd, Amanda Schneiders, MD  Amanda Miner, MD     REFERRING PHYSICIAN: Chevis Pretty III, MD   DIAGNOSIS: There were no encounter diagnoses.   HISTORY OF PRESENT ILLNESS: Amanda Shepherd is a 72 y.o. female seen in the multidisciplinary breast clinic for a new diagnosis of left breast cancer. The patient was noted to have ***.   Biopsy of the left breast showed intermediate grade ductal carcinoma in situ (DCIS) with necrosis and calcifications present. Biopsy of the right breast showed benign breast tissue was negative for malignancy. Prognostics showed estrogen and progesterone receptor negative.   She is seen today to discuss treatment recommendations of her cancer.      PREVIOUS RADIATION THERAPY: {EXAM; YES/NO:19492::"No"}   PAST MEDICAL HISTORY:  Past Medical History:  Diagnosis Date   Bronchitis    Cough in adult 10/27/2013   Diabetes mellitus    Hyperlipidemia        PAST SURGICAL HISTORY: Past Surgical History:  Procedure Laterality Date   ABDOMINAL HYSTERECTOMY     BREAST BIOPSY Left 03/03/2023   MM LT BREAST BX W LOC DEV 1ST LESION IMAGE BX SPEC STEREO GUIDE 03/03/2023 [facility]   BREAST BIOPSY Right 03/03/2023   MM RT BREAST BX W LOC DEV 1ST LESION IMAGE BX SPEC STEREO GUIDE 03/03/2023 [facility]   BREAST BIOPSY Right 03/12/2023   MM RT BREAST BX W LOC DEV 1ST LESION IMAGE BX SPEC STEREO GUIDE 03/12/2023 [facility]   COLONOSCOPY  07/15/2005   at [facility]-normal     FAMILY HISTORY:  Family History  Problem Relation Age of Onset   Hypertension Mother    Colon cancer Neg Hx      SOCIAL HISTORY:  reports that she has never smoked. She has never used smokeless tobacco. She reports that she does not drink alcohol and does not use drugs.   ALLERGIES: Atorvastatin   MEDICATIONS:   Current Outpatient Medications  Medication Sig Dispense Refill   alendronate (FOSAMAX) 70 MG tablet TAKE 1 TABLET BY MOUTH ONCE A WEEK WITH A FULL GLASS OF WATER ON AN EMPTY STOMACH. 4 tablet 0   azelastine (ASTELIN) 0.1 % nasal spray Place 1 spray into both nostrils 2 (two) times daily. Use in each nostril as directed 30 mL 0   Blood Glucose Monitoring Suppl (ONE TOUCH ULTRA 2) w/Device KIT USE TO CHECK GLUCOSE BEFORE MEAL(S) AND  AT BEDTIME 1 kit 0   calcium-vitamin D (OSCAL WITH D) 500-5 MG-MCG tablet Take 1 tablet by mouth daily with breakfast. 90 tablet 0   cetirizine (ZYRTEC) 10 MG tablet Take 1 tablet (10 mg total) by mouth daily as needed for allergies. 30 tablet 4   Dulaglutide (TRULICITY) 3 MG/0.5ML SOPN Inject 3 mg into the skin once a week. 6 mL 0   FARXIGA 10 MG TABS tablet Take 1 tablet by mouth once daily 90 tablet 0   fluticasone (FLONASE) 50 MCG/ACT nasal spray Place 1 spray into both nostrils daily. 16 g 2   glucose blood (ONETOUCH ULTRA) test strip Use to check blood sugar up to 4 times daily 100 each 1   Lancets (ONETOUCH DELICA PLUS LANCET33G) MISC USE TO CHECK BLOOD SUGARS FOUR TIMES DAILY. 100 each 0   loratadine (CLARITIN) 10 MG tablet Take 1 tablet (  10 mg total) by mouth daily. 30 tablet 1   losartan (COZAAR) 25 MG tablet TAKE 1 TABLET BY MOUTH AT BEDTIME 90 tablet 0   metFORMIN (GLUCOPHAGE-XR) 500 MG 24 hr tablet Take 1 tablet (500 mg total) by mouth 2 (two) times daily with a meal. 180 tablet 0   Multiple Vitamin (MULTIVITAMIN) tablet Take 1 tablet by mouth daily. Centrum Silver     rosuvastatin (CRESTOR) 20 MG tablet Take 1 tablet (20 mg total) by mouth daily. 90 tablet 3   No current facility-administered medications for this visit.     REVIEW OF SYSTEMS: On review of systems, the patient reports that she is doing well overall. No breast specific complaints are verbalized.  ***      PHYSICAL EXAM:  Wt Readings from Last 3 Encounters:  02/04/23 119 lb 3.2 oz  (54.1 kg)  02/01/23 123 lb (55.8 kg)  01/18/23 123 lb (55.8 kg)   Temp Readings from Last 3 Encounters:  02/04/23 97.7 F (36.5 C)  01/06/23 99 F (37.2 C)  10/05/22 97.7 F (36.5 C) (Oral)   BP Readings from Last 3 Encounters:  02/04/23 110/79  01/06/23 (!) 94/59  10/27/22 133/80   Pulse Readings from Last 3 Encounters:  02/04/23 90  01/06/23 88  10/27/22 84    In general this is a well appearing *** female in no acute distress. She's alert and oriented x4 and appropriate throughout the examination. Cardiopulmonary assessment is negative for acute distress and she exhibits normal effort. Bilateral breast exam is deferred.    ECOG = ***  0 - Asymptomatic (Fully active, able to carry on all predisease activities without restriction)  1 - Symptomatic but completely ambulatory (Restricted in physically strenuous activity but ambulatory and able to carry out work of a light or sedentary nature. For example, light housework, office work)  2 - Symptomatic, <50% in bed during the day (Ambulatory and capable of all self care but unable to carry out any work activities. Up and about more than 50% of waking hours)  3 - Symptomatic, >50% in bed, but not bedbound (Capable of only limited self-care, confined to bed or chair 50% or more of waking hours)  4 - Bedbound (Completely disabled. Cannot carry on any self-care. Totally confined to bed or chair)  5 - Death   Amanda Shepherd MM, Amanda Shepherd, Amanda Shepherd, et al. [phone]). "Toxicity and response criteria of the [facility]". Am. Evlyn Clines. Oncol. 5 (6): 649-55    LABORATORY DATA:  Lab Results  Component Value Date   WBC 7.9 01/06/2023   HGB 11.3 (L) 01/06/2023   HCT 33.2 (L) 01/06/2023   MCV 90.5 01/06/2023   PLT 248 01/06/2023   Lab Results  Component Value Date   NA 135 01/06/2023   K 3.7 01/06/2023   CL 102 01/06/2023   CO2 22 01/06/2023   Lab Results  Component Value Date   ALT 17 01/06/2023   AST 21  01/06/2023   ALKPHOS 82 01/06/2023   BILITOT 1.0 01/06/2023      RADIOGRAPHY: MM CLIP PLACEMENT RIGHT  Result Date: 03/12/2023 CLINICAL DATA:  Status post stereo biopsy right breast calcifications EXAM: 3D DIAGNOSTIC RIGHT MAMMOGRAM POST STEREOTACTIC BIOPSY COMPARISON:  Previous exam(s). FINDINGS: 3D Mammographic images were obtained following stereotactic guided biopsy of right breast calcifications. The biopsy marking clip is in expected position at the site of biopsy. IMPRESSION: Appropriate positioning of the coil shaped biopsy marking clip at the site of  biopsy in the inferior central right breast. Final Assessment: Post Procedure Mammograms for Marker Placement Electronically Signed   By: Annia Belt M.D.   On: 03/12/2023 11:32  MM RT BREAST BX W LOC DEV 1ST LESION IMAGE BX SPEC STEREO GUIDE  Result Date: 03/12/2023 CLINICAL DATA:  Patient for second biopsy of right breast calcifications. EXAM: RIGHT BREAST STEREOTACTIC CORE NEEDLE BIOPSY COMPARISON:  Previous exam(s). FINDINGS: The patient and I discussed the procedure of stereotactic-guided biopsy including benefits and alternatives. We discussed the high likelihood of a successful procedure. We discussed the risks of the procedure including infection, bleeding, tissue injury, clip migration, and inadequate sampling. Informed written consent was given. The usual time out protocol was performed immediately prior to the procedure. Using sterile technique and 1% Lidocaine as local anesthetic, under stereotactic guidance, a 9 gauge vacuum assisted device was used to perform core needle biopsy of calcifications within the inferior central right breast and using a lateral approach. Specimen radiograph was performed showing calcifications. Specimens with calcifications are identified for pathology. Lesion quadrant: Lower inner quadrant At the conclusion of the procedure, coil shaped tissue marker clip was deployed into the biopsy cavity. Follow-up  2-view mammogram was performed and dictated separately. IMPRESSION: Stereotactic-guided biopsy of right breast calcifications. No apparent complications. Electronically Signed   By: Annia Belt M.D.   On: 03/12/2023 11:22  MM RT BREAST BX W LOC DEV 1ST LESION IMAGE BX SPEC STEREO GUIDE  Addendum Date: 03/08/2023   ADDENDUM REPORT: 03/08/2023 09:34 ADDENDUM: PATHOLOGY revealed: Site 1. Breast, left, needle core biopsy, central (coil clip) DUCTAL CARCINOMA IN SITU, INTERMEDIATE GRADE, SOLID AND CRIBRIFORM TYPES NECROSIS: PRESENT CALCIFICATIONS: PRESENT DCIS LENGTH: 0.4 CM Pathology results are CONCORDANT with imaging findings, per Dr. Sande Brothers. PATHOLOGY revealed: Site 2. Breast, right, needle core biopsy, central (x clip) BENIGN BREAST TISSUE WITH FIBROCYSTIC CHANGES INCLUDING DENSE STROMAL FIBROSIS, ADENOSIS AND MICROCYSTS. NEGATIVE FOR MALIGNANCY. Pathology results are possibly DISCORDANT with imaging findings, per Dr. Sande Brothers. Pathology results and recommendations below were discussed with patient by telephone on 03/04/2023. Patient reported biopsy site within normal limits with slight tenderness at the site. Post biopsy care instructions were reviewed, questions were answered and my direct phone number was provided to patient. Patient was instructed to call [facility] if any concerns or questions arise related to the biopsy. RECOMMENDATION: Patient to return on 03/05/2023 for unilateral RIGHT breast diagnostic mammogram and possible ultrasound to ensure ensure adequate sampling of the calcifications. If final recommendation will be made at that time. The patient was referred to the [facility] at [facility] with appointment on 03/17/2023. Pathology results reported by Lynett Grimes, RN on 03/08/2023. Electronically Signed   By: Sande Brothers M.D.   On: 03/08/2023 09:34   Result Date: 03/08/2023 CLINICAL DATA:   Indeterminate bilateral breast calcifications. EXAM: BILATERAL BREAST STEREOTACTIC CORE NEEDLE BIOPSY COMPARISON:  Previous exam(s). FINDINGS: The patient and I discussed the procedure of stereotactic-guided biopsy including benefits and alternatives. We discussed the high likelihood of a successful procedure. We discussed the risks of the procedure including infection, bleeding, tissue injury, clip migration, and inadequate sampling. Informed written consent was given. The usual time out protocol was performed immediately prior to the procedure. Lesion quadrant: Upper outer quadrant Using sterile technique and 1% Lidocaine as local anesthetic, under stereotactic guidance, a 9 gauge vacuum assisted device was used to perform core needle biopsy of calcifications in the superior central left  breast using a superior approach. Specimen radiograph was performed showing calcifications in several specimens. Specimens with calcifications are identified for pathology. At the conclusion of the procedure, a coil shaped tissue marker clip was deployed into the biopsy cavity. Lesion quadrant: Lower outer quadrant Using sterile technique and 1% Lidocaine as local anesthetic, under stereotactic guidance, a 9 gauge vacuum assisted device was used to perform core needle biopsy of calcifications in the deep central right breast using a superior approach. Specimen radiograph was performed showing question scattered punctate calcifications. Specimens with calcifications are identified for pathology. At the conclusion of the procedure, an X shaped tissue marker clip was deployed into the biopsy cavity. Follow-up 2-view mammogram was performed and dictated separately. IMPRESSION: Stereotactic-guided biopsy of bilateral breasts. No apparent complications. Electronically Signed: By: Sande Brothers M.D. On: 03/03/2023 08:37  MM LT BREAST BX W LOC DEV 1ST LESION IMAGE BX SPEC STEREO GUIDE  Addendum Date: 03/08/2023   ADDENDUM REPORT:  03/08/2023 09:34 ADDENDUM: PATHOLOGY revealed: Site 1. Breast, left, needle core biopsy, central (coil clip) DUCTAL CARCINOMA IN SITU, INTERMEDIATE GRADE, SOLID AND CRIBRIFORM TYPES NECROSIS: PRESENT CALCIFICATIONS: PRESENT DCIS LENGTH: 0.4 CM Pathology results are CONCORDANT with imaging findings, per Dr. Sande Brothers. PATHOLOGY revealed: Site 2. Breast, right, needle core biopsy, central (x clip) BENIGN BREAST TISSUE WITH FIBROCYSTIC CHANGES INCLUDING DENSE STROMAL FIBROSIS, ADENOSIS AND MICROCYSTS. NEGATIVE FOR MALIGNANCY. Pathology results are possibly DISCORDANT with imaging findings, per Dr. Sande Brothers. Pathology results and recommendations below were discussed with patient by telephone on 03/04/2023. Patient reported biopsy site within normal limits with slight tenderness at the site. Post biopsy care instructions were reviewed, questions were answered and my direct phone number was provided to patient. Patient was instructed to call [facility] if any concerns or questions arise related to the biopsy. RECOMMENDATION: Patient to return on 03/05/2023 for unilateral RIGHT breast diagnostic mammogram and possible ultrasound to ensure ensure adequate sampling of the calcifications. If final recommendation will be made at that time. The patient was referred to the [facility] at [facility] with appointment on 03/17/2023. Pathology results reported by Lynett Grimes, RN on 03/08/2023. Electronically Signed   By: Sande Brothers M.D.   On: 03/08/2023 09:34   Result Date: 03/08/2023 CLINICAL DATA:  Indeterminate bilateral breast calcifications. EXAM: BILATERAL BREAST STEREOTACTIC CORE NEEDLE BIOPSY COMPARISON:  Previous exam(s). FINDINGS: The patient and I discussed the procedure of stereotactic-guided biopsy including benefits and alternatives. We discussed the high likelihood of a successful procedure. We discussed the risks of the  procedure including infection, bleeding, tissue injury, clip migration, and inadequate sampling. Informed written consent was given. The usual time out protocol was performed immediately prior to the procedure. Lesion quadrant: Upper outer quadrant Using sterile technique and 1% Lidocaine as local anesthetic, under stereotactic guidance, a 9 gauge vacuum assisted device was used to perform core needle biopsy of calcifications in the superior central left breast using a superior approach. Specimen radiograph was performed showing calcifications in several specimens. Specimens with calcifications are identified for pathology. At the conclusion of the procedure, a coil shaped tissue marker clip was deployed into the biopsy cavity. Lesion quadrant: Lower outer quadrant Using sterile technique and 1% Lidocaine as local anesthetic, under stereotactic guidance, a 9 gauge vacuum assisted device was used to perform core needle biopsy of calcifications in the deep central right breast using a superior approach. Specimen radiograph was performed showing question scattered punctate calcifications. Specimens  with calcifications are identified for pathology. At the conclusion of the procedure, an X shaped tissue marker clip was deployed into the biopsy cavity. Follow-up 2-view mammogram was performed and dictated separately. IMPRESSION: Stereotactic-guided biopsy of bilateral breasts. No apparent complications. Electronically Signed: By: Sande Brothers M.D. On: 03/03/2023 08:37  MM 3D DIAGNOSTIC MAMMOGRAM UNILATERAL RIGHT BREAST  Result Date: 03/05/2023 CLINICAL DATA:  72 year old female with recent bilateral stereotactic guided biopsy of calcifications, with the biopsied left breast revealing DCIS and the biopsied right breast calcifications revealing fibrocystic change however no definite calcifications were present in the specimens. Patient was asked to return for repeat mammography of the right breast to evaluate the  biopsy site and determine if there are residual calcifications to target for biopsy. EXAM: DIGITAL DIAGNOSTIC UNILATERAL RIGHT MAMMOGRAM WITH TOMOSYNTHESIS TECHNIQUE: Right digital diagnostic mammography and breast tomosynthesis was performed. COMPARISON:  Previous exam(s). ACR Breast Density Category c: The breasts are heterogeneously dense, which may obscure small masses. FINDINGS: Spot compression magnification views of the retroareolar right breast demonstrate faint mixed punctate and amorphous calcifications, felt to be stable when compared to recent mammogram. The X shaped biopsy marking clip is located 1.2 cm medial to the biopsied calcifications and 3.2 cm superior. IMPRESSION: Residual calcifications in the retroareolar right breast with the X shaped biopsy marking clip located slightly medial and 3.2 cm superior. RECOMMENDATION: Recommend re-attempt stereotactic guided biopsy of the calcifications in the retroareolar right breast as there are no definite calcifications identified in the specimens from the recent right breast and patient has a recent diagnosis of left breast DCIS. I have discussed the findings and recommendations with the patient. If applicable, a reminder letter will be sent to the patient regarding the next appointment. BI-RADS CATEGORY  4: Suspicious. Electronically Signed   By: Edwin Cap M.D.   On: 03/05/2023 16:34  MM CLIP PLACEMENT RIGHT  Result Date: 03/03/2023 CLINICAL DATA:  Status post bilateral breast biopsy. EXAM: 3D DIAGNOSTIC BILATERAL MAMMOGRAM POST STEREOTACTIC BIOPSY COMPARISON:  Previous exam(s). FINDINGS: 3D Mammographic images were obtained following stereotactic guided biopsy of the bilateral breasts. The coil shaped clip in the superior central left breast is in the expected position. The X shaped clip in the central right breast is migrated approximately 3 cm superior to the site of calcifications. IMPRESSION: 1. Appropriate positioning of the left-sided  post biopsy clip. 2. Approximately 3 cm superior migration of the right-sided post biopsy clip. Final Assessment: Post Procedure Mammograms for Marker Placement Electronically Signed   By: Sande Brothers M.D.   On: 03/03/2023 08:35  MM CLIP PLACEMENT LEFT  Result Date: 03/03/2023 CLINICAL DATA:  Status post bilateral breast biopsy. EXAM: 3D DIAGNOSTIC BILATERAL MAMMOGRAM POST STEREOTACTIC BIOPSY COMPARISON:  Previous exam(s). FINDINGS: 3D Mammographic images were obtained following stereotactic guided biopsy of the bilateral breasts. The coil shaped clip in the superior central left breast is in the expected position. The X shaped clip in the central right breast is migrated approximately 3 cm superior to the site of calcifications. IMPRESSION: 1. Appropriate positioning of the left-sided post biopsy clip. 2. Approximately 3 cm superior migration of the right-sided post biopsy clip. Final Assessment: Post Procedure Mammograms for Marker Placement Electronically Signed   By: Sande Brothers M.D.   On: 03/03/2023 08:35  MM DIAG BREAST TOMO BILATERAL  Result Date: 02/26/2023 CLINICAL DATA:  First six-month follow-up for probably benign calcifications in both breasts. EXAM: DIGITAL DIAGNOSTIC BILATERAL MAMMOGRAM WITH TOMOSYNTHESIS TECHNIQUE: Bilateral digital diagnostic mammography and breast tomosynthesis  was performed. COMPARISON:  Previous exam(s). ACR Breast Density Category c: The breasts are heterogeneously dense, which may obscure small masses. FINDINGS: Magnified views are performed of calcifications in the retroareolar region of the RIGHT breast. In this region, there is a 0.3 x 0.4 millimeter group of faint pleomorphic calcifications. Magnified views are performed of calcifications in the UPPER central LEFT breast. These views demonstrate a group of faint and coarse pleomorphic calcifications spanning 1.2 x 1.5 x 1.3 centimeters. There are no new findings in the LEFT breast. IMPRESSION: 1. Indeterminate  calcifications in retroareolar region of the RIGHT breast. 2. Indeterminate calcifications in the UPPER central LEFT breast. RECOMMENDATION: 1. Stereotactic biopsy of RIGHT calcifications. 2. Stereotactic biopsy LEFT breast calcifications. I have discussed the findings and recommendations with the patient. If applicable, a reminder letter will be sent to the patient regarding the next appointment. BI-RADS CATEGORY  4: Suspicious. Electronically Signed   By: Norva Pavlov M.D.   On: 02/26/2023 10:21      IMPRESSION/PLAN: 1. *** Dr. Mitzi Hansen discussed the pathology findings and reviewed the nature of ***. The consensus from the breast conference includes ***. Dr. Mitzi Hansen recommends external beam radiotherapy to the breast following her ***lumpectomy to reduce risks of local recurrence followed by antiestrogen therapy. We discussed the risks, benefits, short, and long term effects of radiotherapy, as well as the *** intent, and the patient is interested in proceeding. Dr. Mitzi Hansen discussed the delivery and logistics of radiotherapy and anticipates a course of *** weeks of radiotherapy to the *** breast with *** deep inspiration breath hold technique. We will see her back a few weeks after surgery to discuss the simulation process and anticipate starting radiotherapy about 4-6 weeks after surgery.   2. Possible genetic predisposition to malignancy. The patient is a candidate for genetic testing given *** personal and family history. She will meet with our geneticist today in clinic.   In a visit lasting *** minutes, greater than 50% of the time was spent face to face reviewing her case, as well as in preparation of, discussing, and coordinating the patient's care.  The above documentation reflects my direct findings during this shared patient visit. Please see the separate note by Dr. Mitzi Hansen on this date for the remainder of the patient's plan of care.    Joyice Faster, [place]    **Disclaimer: This note was  dictated with voice recognition software. Similar sounding words can inadvertently be transcribed and this note may contain transcription errors which may not have been corrected upon publication of note.**

## 2023-03-17 ENCOUNTER — Ambulatory Visit
Admission: RE | Admit: 2023-03-17 | Discharge: 2023-03-17 | Disposition: A | Discharge status: Home / Self Care | Payer: Medicare Other | Source: Ambulatory Visit | Attending: Radiation Oncology | Admitting: Radiation Oncology

## 2023-03-17 ENCOUNTER — Encounter: Payer: Self-pay | Admitting: General Practice

## 2023-03-17 ENCOUNTER — Inpatient Hospital Stay (HOSPITAL_BASED_OUTPATIENT_CLINIC_OR_DEPARTMENT_OTHER): Payer: Medicare Other | Admitting: Genetic Counselor

## 2023-03-17 ENCOUNTER — Encounter: Payer: Self-pay | Admitting: *Deleted

## 2023-03-17 ENCOUNTER — Ambulatory Visit: Payer: Medicare Other | Admitting: Physical Therapy

## 2023-03-17 ENCOUNTER — Ambulatory Visit: Payer: Self-pay | Admitting: General Surgery

## 2023-03-17 ENCOUNTER — Inpatient Hospital Stay (HOSPITAL_BASED_OUTPATIENT_CLINIC_OR_DEPARTMENT_OTHER): Payer: Medicare Other | Admitting: Hematology and Oncology

## 2023-03-17 ENCOUNTER — Inpatient Hospital Stay: Payer: Medicare Other | Attending: Hematology and Oncology

## 2023-03-17 VITALS — BP 136/66 | HR 88 | Temp 97.9°F | Resp 17 | Ht 66.0 in | Wt 121.0 lb

## 2023-03-17 DIAGNOSIS — E119 Type 2 diabetes mellitus without complications: Secondary | ICD-10-CM | POA: Diagnosis not present

## 2023-03-17 DIAGNOSIS — D241 Benign neoplasm of right breast: Secondary | ICD-10-CM

## 2023-03-17 DIAGNOSIS — I10 Essential (primary) hypertension: Secondary | ICD-10-CM | POA: Diagnosis not present

## 2023-03-17 DIAGNOSIS — D0512 Intraductal carcinoma in situ of left breast: Secondary | ICD-10-CM

## 2023-03-17 DIAGNOSIS — Z7984 Long term (current) use of oral hypoglycemic drugs: Secondary | ICD-10-CM | POA: Insufficient documentation

## 2023-03-17 LAB — CBC WITH DIFFERENTIAL (CANCER CENTER ONLY)
Abs Immature Granulocytes: 0.01 10*3/uL (ref 0.00–0.07)
Basophils Absolute: 0 10*3/uL (ref 0.0–0.1)
Basophils Relative: 1 %
Eosinophils Absolute: 0.2 10*3/uL (ref 0.0–0.5)
Eosinophils Relative: 3 %
HCT: 35.6 % — ABNORMAL LOW (ref 36.0–46.0)
Hemoglobin: 11.5 g/dL — ABNORMAL LOW (ref 12.0–15.0)
Immature Granulocytes: 0 %
Lymphocytes Relative: 35 %
Lymphs Abs: 2.1 10*3/uL (ref 0.7–4.0)
MCH: 30 pg (ref 26.0–34.0)
MCHC: 32.3 g/dL (ref 30.0–36.0)
MCV: 93 fL (ref 80.0–100.0)
Monocytes Absolute: 0.4 10*3/uL (ref 0.1–1.0)
Monocytes Relative: 7 %
Neutro Abs: 3.3 10*3/uL (ref 1.7–7.7)
Neutrophils Relative %: 54 %
Platelet Count: 322 10*3/uL (ref 150–400)
RBC: 3.83 MIL/uL — ABNORMAL LOW (ref 3.87–5.11)
RDW: 13 % (ref 11.5–15.5)
WBC Count: 5.9 10*3/uL (ref 4.0–10.5)
nRBC: 0 % (ref 0.0–0.2)

## 2023-03-17 LAB — CMP (CANCER CENTER ONLY)
ALT: 16 U/L (ref 0–44)
AST: 22 U/L (ref 15–41)
Albumin: 4.3 g/dL (ref 3.5–5.0)
Alkaline Phosphatase: 90 U/L (ref 38–126)
Anion gap: 5 (ref 5–15)
BUN: 26 mg/dL — ABNORMAL HIGH (ref 8–23)
CO2: 28 mmol/L (ref 22–32)
Calcium: 9.3 mg/dL (ref 8.9–10.3)
Chloride: 105 mmol/L (ref 98–111)
Creatinine: 1.33 mg/dL — ABNORMAL HIGH (ref 0.44–1.00)
GFR, Estimated: 43 mL/min — ABNORMAL LOW (ref >60)
Glucose, Bld: 170 mg/dL — ABNORMAL HIGH (ref 70–99)
Potassium: 4.4 mmol/L (ref 3.5–5.1)
Sodium: 138 mmol/L (ref 135–145)
Total Bilirubin: 0.6 mg/dL (ref 0.3–1.2)
Total Protein: 8.3 g/dL — ABNORMAL HIGH (ref 6.5–8.1)

## 2023-03-17 LAB — GENETIC SCREENING ORDER

## 2023-03-17 MED ORDER — KETOROLAC TROMETHAMINE 15 MG/ML IJ SOLN: 15.0000 mg | Freq: Once | INTRAMUSCULAR | Status: AC

## 2023-03-17 NOTE — Assessment & Plan Note (Signed)
03/12/2023:Screening mammogram detected bilateral breast calcifications.  Left breast 1.5 cm biopsy: DCIS intermediate grade solid and cribriform type with necrosis ER 0%, PR 0%.  Right breast retroareolar calcifications 0.4 cm stereotactic biopsy was benign discordant rebiopsy intraductal papilloma  Pathology review: I discussed with the patient the difference between DCIS and invasive breast cancer. It is considered a precancerous lesion. DCIS is classified as a 0. It is generally detected through mammograms as calcifications. We discussed the significance of grades and its impact on prognosis. We also discussed the importance of ER and PR receptors and their implications to adjuvant treatment options. Prognosis of DCIS dependence on grade, comedo necrosis. It is anticipated that if not treated, 20-30% of DCIS can develop into invasive breast cancer.  Recommendation: 1. Breast conserving surgery 2. Followed by adjuvant radiation therapy  Return to clinic after surgery to discuss the final pathology report

## 2023-03-17 NOTE — Progress Notes (Signed)
[facility] CONSULT NOTE  Patient Care Team: Maury Dus, MD as PCP - General (Family Medicine) Dorothy Puffer, MD as Consulting Physician (Radiation Oncology) Serena Croissant, MD as Consulting Physician (Hematology and Oncology) Griselda Miner, MD as Consulting Physician (General Surgery) Pershing Proud, RN as Oncology Nurse Navigator Donnelly Angelica, RN as Oncology Nurse Navigator  CHIEF COMPLAINTS/PURPOSE OF CONSULTATION:  Newly diagnosed left breast DCIS  HISTORY OF PRESENTING ILLNESS:  Amanda Shepherd 72 y.o. female is here because of recent diagnosis of left breast DCIS.  Patient routine screening mammogram detected bilateral breast calcifications.  In the left breast measured 1.5 cm and biopsy came back as Demeter grade DCIS with solid and cribriform type and necrosis ER 0% PR 0%, right breast calcifications 0.4 cm stereotactic biopsy was initially benign discordant and rebiopsy came back as intraductal papilloma.  Patient was presented this morning in the multidiscipline tumor board and she is here today to discuss treatment plan.  I reviewed her records extensively and collaborated the history with the patient.  SUMMARY OF ONCOLOGIC HISTORY: Oncology History  Ductal carcinoma in situ (DCIS) of left breast  03/12/2023 Initial Diagnosis   Screening mammogram detected bilateral breast calcifications.  Left breast 1.5 cm biopsy: DCIS intermediate grade solid and cribriform type with necrosis ER 0%, PR 0%.  Right breast retroareolar calcifications 0.4 cm stereotactic biopsy was benign discordant rebiopsy intraductal papilloma   03/17/2023 Cancer Staging   Staging form: Breast, AJCC 8th Edition - Clinical stage from 03/17/2023: Stage 0 (cTis (DCIS), cN0, cM0, ER-, PR-, HER2: Not Assessed) - Signed by Serena Croissant, MD on 03/17/2023 Stage prefix: Initial diagnosis Nuclear grade: G2 Laterality: Left Staged by: Pathologist and managing physician National guidelines used in  treatment planning: Yes Type of national guideline used in treatment planning: NCCN      MEDICAL HISTORY:  Past Medical History:  Diagnosis Date   Bronchitis    Cough in adult 10/27/2013   Diabetes mellitus    Hyperlipidemia    Hypertension     SURGICAL HISTORY: Past Surgical History:  Procedure Laterality Date   ABDOMINAL HYSTERECTOMY     BREAST BIOPSY Left 03/03/2023   MM LT BREAST BX W LOC DEV 1ST LESION IMAGE BX SPEC STEREO GUIDE 03/03/2023 [facility]   BREAST BIOPSY Right 03/03/2023   MM RT BREAST BX W LOC DEV 1ST LESION IMAGE BX SPEC STEREO GUIDE 03/03/2023 [facility]   BREAST BIOPSY Right 03/12/2023   MM RT BREAST BX W LOC DEV 1ST LESION IMAGE BX SPEC STEREO GUIDE 03/12/2023 [facility]   COLONOSCOPY  07/15/2005   at [facility]-normal    SOCIAL HISTORY: Social History   Socioeconomic History   Marital status: Single    Spouse name: Not on file   Number of children: 2   Years of education: Not on file   Highest education level: Not on file  Occupational History   Occupation: Retired  Tobacco Use   Smoking status: Never   Smokeless tobacco: Never  Vaping Use   Vaping Use: Never used  Substance and Sexual Activity   Alcohol use: No   Drug use: No   Sexual activity: Never  Other Topics Concern   Not on file  Social History Narrative   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    Social Determinants of Health   Financial Resource Strain: Low Risk  (01/07/2023)   Overall  Financial Resource Strain (CARDIA)    Difficulty of Paying Living Expenses: Not hard at all  Food Insecurity: No Food Insecurity (01/07/2023)   Hunger Vital Sign    Worried About Running Out of Food in the Last Year: Never true    Ran Out of Food in the Last Year: Never true  Transportation Needs: No Transportation Needs (01/07/2023)   PRAPARE - Administrator, Civil Service (Medical): No    Lack  of Transportation (Non-Medical): No  Physical Activity: Inactive (01/07/2023)   Exercise Vital Sign    Days of Exercise per Week: 0 days    Minutes of Exercise per Session: 0 min  Stress: No Stress Concern Present (01/07/2023)   [employer] of Occupational Health - Occupational Stress Questionnaire    Feeling of Stress : Not at all  Social Connections: Moderately Isolated (01/07/2023)   Social Connection and Isolation Panel [NHANES]    Frequency of Communication with Friends and Family: More than three times a week    Frequency of Social Gatherings with Friends and Family: Once a week    Attends Religious Services: More than 4 times per year    Active Member of [employer] or Organizations: No    Attends Banker Meetings: Never    Marital Status: Divorced  Catering manager Violence: Not At Risk (01/07/2023)   Humiliation, Afraid, Rape, and Kick questionnaire    Fear of Current or Ex-Partner: No    Emotionally Abused: No    Physically Abused: No    Sexually Abused: No    FAMILY HISTORY: Family History  Problem Relation Age of Onset   Hypertension Mother    Colon cancer Neg Hx     ALLERGIES:  is allergic to atorvastatin.  MEDICATIONS:  Current Outpatient Medications  Medication Sig Dispense Refill   alendronate (FOSAMAX) 70 MG tablet TAKE 1 TABLET BY MOUTH ONCE A WEEK WITH A FULL GLASS OF WATER ON AN EMPTY STOMACH. 4 tablet 0   azelastine (ASTELIN) 0.1 % nasal spray Place 1 spray into both nostrils 2 (two) times daily. Use in each nostril as directed 30 mL 0   Blood Glucose Monitoring Suppl (ONE TOUCH ULTRA 2) w/Device KIT USE TO CHECK GLUCOSE BEFORE MEAL(S) AND  AT BEDTIME 1 kit 0   calcium-vitamin D (OSCAL WITH D) 500-5 MG-MCG tablet Take 1 tablet by mouth daily with breakfast. 90 tablet 0   cetirizine (ZYRTEC) 10 MG tablet Take 1 tablet (10 mg total) by mouth daily as needed for allergies. 30 tablet 4   Dulaglutide (TRULICITY) 3 MG/0.5ML SOPN Inject 3 mg into the  skin once a week. 6 mL 0   FARXIGA 10 MG TABS tablet Take 1 tablet by mouth once daily 90 tablet 0   fluticasone (FLONASE) 50 MCG/ACT nasal spray Place 1 spray into both nostrils daily. 16 g 2   glucose blood (ONETOUCH ULTRA) test strip Use to check blood sugar up to 4 times daily 100 each 1   Lancets (ONETOUCH DELICA PLUS LANCET33G) MISC USE TO CHECK BLOOD SUGARS FOUR TIMES DAILY. 100 each 0   loratadine (CLARITIN) 10 MG tablet Take 1 tablet (10 mg total) by mouth daily. 30 tablet 1   losartan (COZAAR) 25 MG tablet TAKE 1 TABLET BY MOUTH AT BEDTIME 90 tablet 0   metFORMIN (GLUCOPHAGE-XR) 500 MG 24 hr tablet Take 1 tablet (500 mg total) by mouth 2 (two) times daily with a meal. 180 tablet 0   Multiple Vitamin (MULTIVITAMIN) tablet  Take 1 tablet by mouth daily. Centrum Silver     rosuvastatin (CRESTOR) 20 MG tablet Take 1 tablet (20 mg total) by mouth daily. 90 tablet 3   No current facility-administered medications for this visit.    REVIEW OF SYSTEMS:   Constitutional: Denies fevers, chills or abnormal night sweats Breast:  Denies any palpable lumps or discharge All other systems were reviewed with the patient and are negative.  PHYSICAL EXAMINATION: ECOG PERFORMANCE STATUS: 0 - Asymptomatic  Vitals:   03/17/23 1300  BP: 136/66  Pulse: 88  Resp: 17  Temp: 97.9 F (36.6 C)  SpO2: 100%   Filed Weights   03/17/23 1300  Weight: 121 lb (54.9 kg)    GENERAL:alert, no distress and comfortable    LABORATORY DATA:  I have reviewed the data as listed Lab Results  Component Value Date   WBC 5.9 03/17/2023   HGB 11.5 (L) 03/17/2023   HCT 35.6 (L) 03/17/2023   MCV 93.0 03/17/2023   PLT 322 03/17/2023   Lab Results  Component Value Date   NA 138 03/17/2023   K 4.4 03/17/2023   CL 105 03/17/2023   CO2 28 03/17/2023    RADIOGRAPHIC STUDIES: I have personally reviewed the radiological reports and agreed with the findings in the report.  ASSESSMENT AND PLAN:  Ductal  carcinoma in situ (DCIS) of left breast 03/12/2023:Screening mammogram detected bilateral breast calcifications.  Left breast 1.5 cm biopsy: DCIS intermediate grade solid and cribriform type with necrosis ER 0%, PR 0%.  Right breast retroareolar calcifications 0.4 cm stereotactic biopsy was benign discordant rebiopsy intraductal papilloma  Pathology review: I discussed with the patient the difference between DCIS and invasive breast cancer. It is considered a precancerous lesion. DCIS is classified as a 0. It is generally detected through mammograms as calcifications. We discussed the significance of grades and its impact on prognosis. We also discussed the importance of ER and PR receptors and their implications to adjuvant treatment options. Prognosis of DCIS dependence on grade, comedo necrosis. It is anticipated that if not treated, 20-30% of DCIS can develop into invasive breast cancer.  Recommendation: 1. Breast conserving surgery 2. Followed by adjuvant radiation therapy  Return to clinic after surgery to discuss the final pathology report   All questions were answered. The patient knows to call the clinic with any problems, questions or concerns.    Tamsen Meek, MD 03/17/23

## 2023-03-17 NOTE — Progress Notes (Signed)
[facility] Psychosocial Distress Screening Spiritual Care  Met with Amanda Shepherd and [name] Session in Breast Multidisciplinary Clinic to introduce Support Center team/resources, reviewing distress screen per protocol.  The patient scored a  0  on the Psychosocial Distress Thermometer which indicates  minimal  distress. Also assessed for distress and other psychosocial needs.      03/17/2023    5:11 PM  ONCBCN DISTRESS SCREENING  Screening Type Initial Screening  Distress experienced in past week (1-10) 0  Referral to support programs Yes    Chaplain and patient discussed common feelings and emotions when being diagnosed with cancer, and the importance of support during treatment.  Chaplain informed patient of the support team and support services at [facility].  Chaplain provided contact information and encouraged patient to call with any questions or concerns.  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 She enjoys humor for meaning-making and coping.  Follow up needed: No. [name] has direct Spiritual Care number and prefers to reach out as needed/desired.   [address] [place], [place], [facility] Pager [phone] Voicemail [phone]

## 2023-03-18 NOTE — Progress Notes (Signed)
REFERRING PROVIDER: Serena Croissant, MD [address] [place],  [place] [address]  PRIMARY PROVIDER:  Maury Dus, MD  PRIMARY REASON FOR VISIT:  1. Ductal carcinoma in situ (DCIS) of left breast     HISTORY OF PRESENT ILLNESS:   Amanda Shepherd, a 72 y.o. female, was seen for a [place] cancer genetics consultation at the request of Dr. Pamelia Hoit due to a personal history of breast cancer.  Amanda Shepherd presents to clinic today to discuss the possibility of a hereditary predisposition to cancer, to discuss genetic testing, and to further clarify her future cancer risks, as well as potential cancer risks for family members.   In May 2024, at the age of 32, Amanda Shepherd was diagnosed with ductal carcinoma in situ of the left breast (ER-/PR-). The treatment plan is pending.   CANCER HISTORY:  Oncology History  Ductal carcinoma in situ (DCIS) of left breast  03/12/2023 Initial Diagnosis   Screening mammogram detected bilateral breast calcifications.  Left breast 1.5 cm biopsy: DCIS intermediate grade solid and cribriform type with necrosis ER 0%, PR 0%.  Right breast retroareolar calcifications 0.4 cm stereotactic biopsy was benign discordant rebiopsy intraductal papilloma   03/17/2023 Cancer Staging   Staging form: Breast, AJCC 8th Edition - Clinical stage from 03/17/2023: Stage 0 (cTis (DCIS), cN0, cM0, ER-, PR-, HER2: Not Assessed) - Signed by Serena Croissant, MD on 03/17/2023 Stage prefix: Initial diagnosis Nuclear grade: G2 Laterality: Left Staged by: Pathologist and managing physician National guidelines used in treatment planning: Yes Type of national guideline used in treatment planning: NCCN      Past Medical History:  Diagnosis Date   Bronchitis    Cough in adult 10/27/2013   Diabetes mellitus    Hyperlipidemia    Hypertension     Past Surgical History:  Procedure Laterality Date   ABDOMINAL HYSTERECTOMY     BREAST BIOPSY Left 03/03/2023   MM LT BREAST BX W LOC DEV  1ST LESION IMAGE BX SPEC STEREO GUIDE 03/03/2023 [facility]   BREAST BIOPSY Right 03/03/2023   MM RT BREAST BX W LOC DEV 1ST LESION IMAGE BX SPEC STEREO GUIDE 03/03/2023 [facility]   BREAST BIOPSY Right 03/12/2023   MM RT BREAST BX W LOC DEV 1ST LESION IMAGE BX SPEC STEREO GUIDE 03/12/2023 [facility]   COLONOSCOPY  07/15/2005   at [facility]-normal    FAMILY HISTORY:  We obtained a detailed, 4-generation family history.  Significant diagnoses are listed below: Family History  Problem Relation Age of Onset   Thyroid cancer Son        dx 23s         Amanda Shepherd described cancer in several generations of her paternal family and declined providing additional information. Amanda Shepherd is unaware of previous family history of genetic testing for hereditary cancer risks. There is no reported Ashkenazi Jewish ancestry.   GENETIC COUNSELING ASSESSMENT: Amanda Shepherd is a 72 y.o. female with a personal history of DCIS and limited information about family history. We, therefore, discussed and recommended the following at today's visit.   DISCUSSION: We discussed that 5 - 10% of cancer is hereditary.  Most cases of breast associated with mutations in BRCA1/2.  There are other genes that can be associated with hereditary breast cancer syndromes. We discussed that testing is beneficial for several reasons including knowing how to follow individuals for their cancer risks and understanding if other family members could be at risk for cancer and allowing them to undergo  genetic testing.   We discussed with Amanda Shepherd that the personal history does not meet NCCN criteria for genetic testing and, therefore, is not highly consistent with a familial hereditary cancer syndrome.  However, we discussed that it is reasonable to genetic testing given limited information about her paternal relatives.   She wished to proceed in order to provide genetic risk information to her children.   We reviewed the  characteristics, features and inheritance patterns of hereditary cancer syndromes. We also discussed genetic testing, including the appropriate family members to test, the process of testing, insurance coverage and turn-around-time for results. We discussed the implications of a negative, positive, and/or variant of uncertain significant result. We recommended Amanda Shepherd pursue genetic testing for a panel that includes genes associated with breast, thyroid, and other cancers.   The Multi-Cancer + RNA Panel offered by [facility] includes sequencing and/or deletion/duplication analysis of the following 70 genes:  AIP*, ALK, APC*, ATM*, AXIN2*, BAP1*, BARD1*, BLM*, BMPR1A*, BRCA1*, BRCA2*, BRIP1*, CDC73*, CDH1*, CDK4, CDKN1B*, CDKN2A, CHEK2*, CTNNA1*, DICER1*, EPCAM (del/dup only), EGFR, FH*, FLCN*, GREM1 (promoter dup only), HOXB13, KIT, LZTR1, MAX*, MBD4, MEN1*, MET, MITF, MLH1*, MSH2*, MSH3*, MSH6*, MUTYH*, NF1*, NF2*, NTHL1*, PALB2*, PDGFRA, PMS2*, POLD1*, POLE*, POT1*, PRKAR1A*, PTCH1*, PTEN*, RAD51C*, RAD51D*, RB1*, RET, SDHA* (sequencing only), SDHAF2*, SDHB*, SDHC*, SDHD*, SMAD4*, SMARCA4*, SMARCB1*, SMARCE1*, STK11*, SUFU*, TMEM127*, TP53*, TSC1*, TSC2*, VHL*. RNA analysis is performed for * genes.  PLAN: After considering the risks, benefits, and limitations, Amanda Shepherd provided informed consent to pursue genetic testing and the blood sample was sent to [facility] for analysis of the Multi-Cancer +RNA Panel. Results should be available within approximately 3 weeks' time, at which point they will be disclosed by telephone to Amanda Shepherd, as will any additional recommendations warranted by these results. Amanda Shepherd will receive a summary of her genetic counseling visit and a copy of her results once available. This information will also be available in Epic.   Amanda Shepherd questions were answered to her satisfaction today. Our contact information was provided should additional questions or concerns  arise. Thank you for the referral and allowing [place] to share in the care of your patient.   Agastya Meister M. Rennie Plowman, MS, [facility] Genetic Counselor [email or web address] (P) [phone]  The patient was seen for a total of 15 minutes in face-to-face genetic counseling.  The patient was accompanied by her daughter.  Drs. Pamelia Hoit and/or Mosetta Putt were available to discuss this case as needed.    _______________________________________________________________________ For Office Staff:  Number of people involved in session: 2 Was an Intern/ student involved with case: no

## 2023-03-22 ENCOUNTER — Other Ambulatory Visit: Payer: Self-pay | Admitting: General Surgery

## 2023-03-22 ENCOUNTER — Encounter: Payer: Self-pay | Admitting: *Deleted

## 2023-03-22 DIAGNOSIS — D0512 Intraductal carcinoma in situ of left breast: Secondary | ICD-10-CM

## 2023-03-22 DIAGNOSIS — D241 Benign neoplasm of right breast: Secondary | ICD-10-CM

## 2023-03-23 ENCOUNTER — Telehealth: Payer: Self-pay | Admitting: Hematology and Oncology

## 2023-03-23 NOTE — Telephone Encounter (Signed)
Scheduled appointment per scheduling message. Patient is aware of the made appointment. 

## 2023-03-25 ENCOUNTER — Telehealth: Payer: Self-pay | Admitting: Pharmacist

## 2023-03-25 DIAGNOSIS — E1169 Type 2 diabetes mellitus with other specified complication: Secondary | ICD-10-CM

## 2023-03-25 NOTE — Telephone Encounter (Signed)
Reviewed and agree with Dr Koval's plan.   

## 2023-03-25 NOTE — Telephone Encounter (Signed)
Contacted patient in follow-up of missed appointment for Diabetes follow-up visit within the [employer] - CGM study.   Patient has been recently diagnosed with Breast Cancer, has numerous visits scheduled in the upcoming month, and is not interested in using the CGM / being involved in the study at this time.   She reports she has been doing well with her blood sugar as long as she has access to her medications.  She has been having issues with access to her Trulicity due to back-orders.   I asked her to come to a visit if her glucose control worsens during her treatment for her breast cancer.  She verbalized understanding that we should work on her blood sugar while being treated.    No further [employer] study involvement planned.  

## 2023-03-25 NOTE — Assessment & Plan Note (Signed)
Contacted patient in follow-up of missed appointment for Diabetes follow-up visit within the [employer] - CGM study.   Patient has been recently diagnosed with Breast Cancer, has numerous visits scheduled in the upcoming month, and is not interested in using the CGM / being involved in the study at this time.   She reports she has been doing well with her blood sugar as long as she has access to her medications.  She has been having issues with access to her Trulicity due to back-orders.   I asked her to come to a visit if her glucose control worsens during her treatment for her breast cancer.  She verbalized understanding that we should work on her blood sugar while being treated.    No further [employer] study involvement planned.

## 2023-03-26 ENCOUNTER — Encounter: Payer: Self-pay | Admitting: *Deleted

## 2023-03-26 ENCOUNTER — Telehealth: Payer: Self-pay | Admitting: *Deleted

## 2023-03-26 NOTE — Telephone Encounter (Signed)
Spoke with patient to follow up from [facility] and assess navigation needs. Reviewed appts. Patient denies any questions or concerns at this time.  Encouraged her to call should anything arise. Patient verbalized understanding.

## 2023-04-01 ENCOUNTER — Other Ambulatory Visit: Payer: Self-pay | Admitting: Family Medicine

## 2023-04-01 DIAGNOSIS — E1169 Type 2 diabetes mellitus with other specified complication: Secondary | ICD-10-CM

## 2023-04-05 ENCOUNTER — Other Ambulatory Visit (HOSPITAL_COMMUNITY): Payer: Self-pay

## 2023-04-05 ENCOUNTER — Telehealth: Payer: Self-pay | Admitting: Genetic Counselor

## 2023-04-05 NOTE — Telephone Encounter (Signed)
Contacted Pt with attempt to disclose results of genetic testing. LVM requesting that Pt contact us to discuss. Provided the GC Cari's direct line.

## 2023-04-06 ENCOUNTER — Other Ambulatory Visit (HOSPITAL_COMMUNITY): Payer: Self-pay

## 2023-04-07 ENCOUNTER — Other Ambulatory Visit (HOSPITAL_COMMUNITY): Payer: Self-pay

## 2023-04-13 ENCOUNTER — Encounter (HOSPITAL_BASED_OUTPATIENT_CLINIC_OR_DEPARTMENT_OTHER): Payer: Self-pay | Admitting: General Surgery

## 2023-04-13 ENCOUNTER — Encounter (HOSPITAL_BASED_OUTPATIENT_CLINIC_OR_DEPARTMENT_OTHER): Payer: Self-pay

## 2023-04-13 ENCOUNTER — Other Ambulatory Visit: Payer: Self-pay

## 2023-04-13 ENCOUNTER — Telehealth: Payer: Self-pay | Admitting: *Deleted

## 2023-04-13 NOTE — Telephone Encounter (Signed)
Faxed order for compression bra to [facility] [employer]

## 2023-04-20 ENCOUNTER — Ambulatory Visit
Admission: RE | Admit: 2023-04-20 | Discharge: 2023-04-20 | Disposition: A | Discharge status: Home / Self Care | Payer: Medicare Other | Source: Ambulatory Visit | Attending: General Surgery | Admitting: General Surgery

## 2023-04-20 ENCOUNTER — Encounter (HOSPITAL_BASED_OUTPATIENT_CLINIC_OR_DEPARTMENT_OTHER)
Admission: RE | Admit: 2023-04-20 | Discharge: 2023-04-20 | Disposition: A | Discharge status: Home / Self Care | Payer: Medicare Other | Source: Ambulatory Visit | Attending: General Surgery | Admitting: General Surgery

## 2023-04-20 DIAGNOSIS — Z01812 Encounter for preprocedural laboratory examination: Secondary | ICD-10-CM | POA: Insufficient documentation

## 2023-04-20 DIAGNOSIS — N6011 Diffuse cystic mastopathy of right breast: Secondary | ICD-10-CM | POA: Diagnosis not present

## 2023-04-20 DIAGNOSIS — E785 Hyperlipidemia, unspecified: Secondary | ICD-10-CM | POA: Diagnosis not present

## 2023-04-20 DIAGNOSIS — Z171 Estrogen receptor negative status [ER-]: Secondary | ICD-10-CM | POA: Diagnosis not present

## 2023-04-20 DIAGNOSIS — D0512 Intraductal carcinoma in situ of left breast: Secondary | ICD-10-CM

## 2023-04-20 DIAGNOSIS — E119 Type 2 diabetes mellitus without complications: Secondary | ICD-10-CM | POA: Diagnosis not present

## 2023-04-20 DIAGNOSIS — M810 Age-related osteoporosis without current pathological fracture: Secondary | ICD-10-CM | POA: Diagnosis not present

## 2023-04-20 DIAGNOSIS — Z79899 Other long term (current) drug therapy: Secondary | ICD-10-CM | POA: Diagnosis not present

## 2023-04-20 DIAGNOSIS — D241 Benign neoplasm of right breast: Secondary | ICD-10-CM

## 2023-04-20 DIAGNOSIS — E1169 Type 2 diabetes mellitus with other specified complication: Secondary | ICD-10-CM | POA: Insufficient documentation

## 2023-04-20 DIAGNOSIS — Z7984 Long term (current) use of oral hypoglycemic drugs: Secondary | ICD-10-CM | POA: Diagnosis not present

## 2023-04-20 DIAGNOSIS — I10 Essential (primary) hypertension: Secondary | ICD-10-CM | POA: Diagnosis not present

## 2023-04-20 DIAGNOSIS — Z7985 Long-term (current) use of injectable non-insulin antidiabetic drugs: Secondary | ICD-10-CM | POA: Diagnosis not present

## 2023-04-20 DIAGNOSIS — N6021 Fibroadenosis of right breast: Secondary | ICD-10-CM | POA: Diagnosis not present

## 2023-04-20 HISTORY — PX: BREAST BIOPSY: SHX20

## 2023-04-20 LAB — BASIC METABOLIC PANEL
Anion gap: 8 (ref 5–15)
BUN: 30 mg/dL — ABNORMAL HIGH (ref 8–23)
CO2: 23 mmol/L (ref 22–32)
Calcium: 9.1 mg/dL (ref 8.9–10.3)
Chloride: 108 mmol/L (ref 98–111)
Creatinine, Ser: 1.55 mg/dL — ABNORMAL HIGH (ref 0.44–1.00)
GFR, Estimated: 36 mL/min — ABNORMAL LOW (ref >60)
Glucose, Bld: 194 mg/dL — ABNORMAL HIGH (ref 70–99)
Potassium: 4.2 mmol/L (ref 3.5–5.1)
Sodium: 139 mmol/L (ref 135–145)

## 2023-04-20 MED ORDER — CHLORHEXIDINE GLUCONATE CLOTH 2 % EX PADS: 6.0000 | MEDICATED_PAD | Freq: Once | CUTANEOUS | Status: DC

## 2023-04-20 NOTE — Progress Notes (Signed)

## 2023-04-21 ENCOUNTER — Ambulatory Visit
Admission: RE | Admit: 2023-04-21 | Discharge: 2023-04-21 | Disposition: A | Discharge status: Home / Self Care | Payer: Medicare Other | Source: Ambulatory Visit | Attending: General Surgery | Admitting: General Surgery

## 2023-04-21 ENCOUNTER — Other Ambulatory Visit (HOSPITAL_COMMUNITY): Payer: Self-pay

## 2023-04-21 ENCOUNTER — Ambulatory Visit (HOSPITAL_BASED_OUTPATIENT_CLINIC_OR_DEPARTMENT_OTHER)
Admission: RE | Admit: 2023-04-21 | Discharge: 2023-04-21 | Disposition: A | Discharge status: Home / Self Care | Payer: Medicare Other | Attending: General Surgery | Admitting: General Surgery

## 2023-04-21 ENCOUNTER — Other Ambulatory Visit: Payer: Self-pay

## 2023-04-21 ENCOUNTER — Encounter (HOSPITAL_BASED_OUTPATIENT_CLINIC_OR_DEPARTMENT_OTHER)
Admission: RE | Disposition: A | Discharge status: Home / Self Care | Payer: Self-pay | Source: Home / Self Care | Attending: General Surgery

## 2023-04-21 ENCOUNTER — Ambulatory Visit (HOSPITAL_BASED_OUTPATIENT_CLINIC_OR_DEPARTMENT_OTHER): Payer: Medicare Other | Admitting: Anesthesiology

## 2023-04-21 ENCOUNTER — Encounter (HOSPITAL_BASED_OUTPATIENT_CLINIC_OR_DEPARTMENT_OTHER): Payer: Self-pay | Admitting: General Surgery

## 2023-04-21 DIAGNOSIS — Z7984 Long term (current) use of oral hypoglycemic drugs: Secondary | ICD-10-CM | POA: Insufficient documentation

## 2023-04-21 DIAGNOSIS — Z7985 Long-term (current) use of injectable non-insulin antidiabetic drugs: Secondary | ICD-10-CM | POA: Insufficient documentation

## 2023-04-21 DIAGNOSIS — I10 Essential (primary) hypertension: Secondary | ICD-10-CM | POA: Insufficient documentation

## 2023-04-21 DIAGNOSIS — E1169 Type 2 diabetes mellitus with other specified complication: Secondary | ICD-10-CM

## 2023-04-21 DIAGNOSIS — Z01818 Encounter for other preprocedural examination: Secondary | ICD-10-CM

## 2023-04-21 DIAGNOSIS — D241 Benign neoplasm of right breast: Secondary | ICD-10-CM | POA: Insufficient documentation

## 2023-04-21 DIAGNOSIS — N6011 Diffuse cystic mastopathy of right breast: Secondary | ICD-10-CM | POA: Insufficient documentation

## 2023-04-21 DIAGNOSIS — Z79899 Other long term (current) drug therapy: Secondary | ICD-10-CM | POA: Insufficient documentation

## 2023-04-21 DIAGNOSIS — E785 Hyperlipidemia, unspecified: Secondary | ICD-10-CM | POA: Insufficient documentation

## 2023-04-21 DIAGNOSIS — D0512 Intraductal carcinoma in situ of left breast: Secondary | ICD-10-CM | POA: Diagnosis not present

## 2023-04-21 DIAGNOSIS — M810 Age-related osteoporosis without current pathological fracture: Secondary | ICD-10-CM | POA: Insufficient documentation

## 2023-04-21 DIAGNOSIS — E119 Type 2 diabetes mellitus without complications: Secondary | ICD-10-CM | POA: Insufficient documentation

## 2023-04-21 DIAGNOSIS — Z171 Estrogen receptor negative status [ER-]: Secondary | ICD-10-CM | POA: Insufficient documentation

## 2023-04-21 DIAGNOSIS — N6021 Fibroadenosis of right breast: Secondary | ICD-10-CM | POA: Insufficient documentation

## 2023-04-21 HISTORY — PX: BREAST LUMPECTOMY WITH RADIOACTIVE SEED LOCALIZATION: SHX6424

## 2023-04-21 LAB — GLUCOSE, CAPILLARY
Glucose-Capillary: 95 mg/dL (ref 70–99)
Glucose-Capillary: 130 mg/dL — ABNORMAL HIGH (ref 70–99)

## 2023-04-21 SURGERY — BREAST LUMPECTOMY WITH RADIOACTIVE SEED LOCALIZATION
Anesthesia: General | Site: Breast | Laterality: Bilateral

## 2023-04-21 MED ORDER — LIDOCAINE 2% (20 MG/ML) 5 ML SYRINGE
INTRAMUSCULAR | Status: DC | PRN
  Administered 2023-04-21: 60 mg via INTRAVENOUS

## 2023-04-21 MED ORDER — ACETAMINOPHEN 500 MG PO TABS: 1000.0000 mg | ORAL_TABLET | ORAL | Status: AC

## 2023-04-21 MED ORDER — GABAPENTIN 100 MG PO CAPS: 100.0000 mg | ORAL_CAPSULE | ORAL | Status: DC

## 2023-04-21 MED ORDER — BUPIVACAINE-EPINEPHRINE (PF) 0.25% -1:200000 IJ SOLN
INTRAMUSCULAR | Status: DC | PRN
  Administered 2023-04-21: 30 mL via PERINEURAL

## 2023-04-21 MED ORDER — DEXAMETHASONE SODIUM PHOSPHATE 10 MG/ML IJ SOLN
INTRAMUSCULAR | Status: DC | PRN
  Administered 2023-04-21: 4 mg via INTRAVENOUS

## 2023-04-21 MED ORDER — BUPIVACAINE HCL (PF) 0.25 % IJ SOLN
INTRAMUSCULAR | Status: AC
  Filled 2023-04-21: qty 30

## 2023-04-21 MED ORDER — CEFAZOLIN SODIUM-DEXTROSE 2-4 GM/100ML-% IV SOLN
INTRAVENOUS | Status: AC
  Filled 2023-04-21: qty 100

## 2023-04-21 MED ORDER — SUCCINYLCHOLINE CHLORIDE 200 MG/10ML IV SOSY
PREFILLED_SYRINGE | INTRAVENOUS | Status: DC | PRN
  Administered 2023-04-21: 60 mg via INTRAVENOUS

## 2023-04-21 MED ORDER — BUPIVACAINE-EPINEPHRINE (PF) 0.25% -1:200000 IJ SOLN
INTRAMUSCULAR | Status: AC
  Filled 2023-04-21: qty 30

## 2023-04-21 MED ORDER — FENTANYL CITRATE (PF) 100 MCG/2ML IJ SOLN
INTRAMUSCULAR | Status: DC | PRN
  Administered 2023-04-21: 50 ug via INTRAVENOUS

## 2023-04-21 MED ORDER — GABAPENTIN 100 MG PO CAPS
ORAL_CAPSULE | ORAL | Status: AC
  Filled 2023-04-21: qty 1

## 2023-04-21 MED ORDER — CEFAZOLIN SODIUM-DEXTROSE 2-4 GM/100ML-% IV SOLN
2.0000 g | INTRAVENOUS | Status: AC
  Administered 2023-04-21: 2 g via INTRAVENOUS

## 2023-04-21 MED ORDER — PHENYLEPHRINE HCL (PRESSORS) 10 MG/ML IV SOLN
INTRAVENOUS | Status: DC | PRN
  Administered 2023-04-21 (×3): 80 ug via INTRAVENOUS

## 2023-04-21 MED ORDER — FENTANYL CITRATE (PF) 100 MCG/2ML IJ SOLN
INTRAMUSCULAR | Status: AC
  Filled 2023-04-21: qty 2

## 2023-04-21 MED ORDER — EPHEDRINE SULFATE (PRESSORS) 50 MG/ML IJ SOLN
INTRAMUSCULAR | Status: DC | PRN
  Administered 2023-04-21 (×3): 5 mg via INTRAVENOUS
  Administered 2023-04-21: 10 mg via INTRAVENOUS

## 2023-04-21 MED ORDER — OXYCODONE HCL 5 MG PO TABS
5.0000 mg | ORAL_TABLET | Freq: Four times a day (QID) | ORAL | 0 refills | Status: DC | PRN
Start: 2023-04-21 — End: 2023-07-08
  Filled 2023-04-21: qty 10, 3d supply, fill #0

## 2023-04-21 MED ORDER — LACTATED RINGERS IV SOLN: INTRAVENOUS | Status: DC

## 2023-04-21 MED ORDER — ONDANSETRON HCL 4 MG/2ML IJ SOLN
INTRAMUSCULAR | Status: DC | PRN
  Administered 2023-04-21: 4 mg via INTRAVENOUS

## 2023-04-21 MED ORDER — LIDOCAINE 2% (20 MG/ML) 5 ML SYRINGE
INTRAMUSCULAR | Status: AC
  Filled 2023-04-21: qty 5

## 2023-04-21 MED ORDER — PROPOFOL 10 MG/ML IV BOLUS
INTRAVENOUS | Status: DC | PRN
  Administered 2023-04-21: 100 mg via INTRAVENOUS

## 2023-04-21 MED ORDER — ACETAMINOPHEN 500 MG PO TABS
1000.0000 mg | ORAL_TABLET | Freq: Once | ORAL | Status: AC
  Administered 2023-04-21: 1000 mg via ORAL

## 2023-04-21 MED ORDER — ACETAMINOPHEN 500 MG PO TABS
ORAL_TABLET | ORAL | Status: AC
  Filled 2023-04-21: qty 2

## 2023-04-21 MED ORDER — MIDAZOLAM HCL 2 MG/2ML IJ SOLN
INTRAMUSCULAR | Status: AC
  Filled 2023-04-21: qty 2

## 2023-04-21 SURGICAL SUPPLY — 39 items
ADH SKN CLS APL DERMABOND .7 (GAUZE/BANDAGES/DRESSINGS) ×1
APL PRP STRL LF DISP 70% ISPRP (MISCELLANEOUS) ×1
APPLIER CLIP 9.375 MED OPEN (MISCELLANEOUS)
APR CLP MED 9.3 20 MLT OPN (MISCELLANEOUS)
BLADE SURG 15 STRL LF DISP TIS (BLADE) ×1 IMPLANT
BLADE SURG 15 STRL SS (BLADE) ×1
CANISTER SUC SOCK COL 7IN (MISCELLANEOUS) ×1 IMPLANT
CANISTER SUCT 1200ML W/VALVE (MISCELLANEOUS) ×1 IMPLANT
CHLORAPREP W/TINT 26 (MISCELLANEOUS) ×1 IMPLANT
CLIP APPLIE 9.375 MED OPEN (MISCELLANEOUS) IMPLANT
COVER BACK TABLE 60X90IN (DRAPES) ×1 IMPLANT
COVER MAYO STAND STRL (DRAPES) ×1 IMPLANT
COVER PROBE CYLINDRICAL 5X96 (MISCELLANEOUS) ×1 IMPLANT
DERMABOND ADVANCED .7 DNX12 (GAUZE/BANDAGES/DRESSINGS) ×1 IMPLANT
DRAPE LAPAROSCOPIC ABDOMINAL (DRAPES) ×1 IMPLANT
DRAPE UTILITY XL STRL (DRAPES) ×1 IMPLANT
ELECT COATED BLADE 2.86 ST (ELECTRODE) ×1 IMPLANT
ELECT REM PT RETURN 9FT ADLT (ELECTROSURGICAL) ×1
ELECTRODE REM PT RTRN 9FT ADLT (ELECTROSURGICAL) ×1 IMPLANT
GLOVE BIO SURGEON STRL SZ7.5 (GLOVE) ×2 IMPLANT
GOWN STRL REUS W/ TWL LRG LVL3 (GOWN DISPOSABLE) ×2 IMPLANT
GOWN STRL REUS W/TWL LRG LVL3 (GOWN DISPOSABLE) ×2
KIT MARKER MARGIN INK (KITS) ×1 IMPLANT
NDL HYPO 25X1 1.5 SAFETY (NEEDLE) IMPLANT
NEEDLE HYPO 25X1 1.5 SAFETY (NEEDLE) IMPLANT
NS IRRIG 1000ML POUR BTL (IV SOLUTION) IMPLANT
PACK BASIN DAY SURGERY FS (CUSTOM PROCEDURE TRAY) ×1 IMPLANT
PENCIL SMOKE EVACUATOR (MISCELLANEOUS) ×1 IMPLANT
SLEEVE SCD COMPRESS KNEE MED (STOCKING) ×1 IMPLANT
SPIKE FLUID TRANSFER (MISCELLANEOUS) IMPLANT
SPONGE T-LAP 18X18 ~~LOC~~+RFID (SPONGE) ×1 IMPLANT
SUT MON AB 4-0 PC3 18 (SUTURE) ×1 IMPLANT
SUT SILK 2 0 SH (SUTURE) IMPLANT
SUT VICRYL 3-0 CR8 SH (SUTURE) ×1 IMPLANT
SYR CONTROL 10ML LL (SYRINGE) IMPLANT
TOWEL GREEN STERILE FF (TOWEL DISPOSABLE) ×1 IMPLANT
TRAY FAXITRON CT DISP (TRAY / TRAY PROCEDURE) ×1 IMPLANT
TUBE CONNECTING 20X1/4 (TUBING) ×1 IMPLANT
YANKAUER SUCT BULB TIP NO VENT (SUCTIONS) IMPLANT

## 2023-04-21 NOTE — Anesthesia Procedure Notes (Signed)
Procedure Name: Intubation Date/Time: 04/21/2023 1:32 PM  Performed by: Lauralyn Primes, CRNAPre-anesthesia Checklist: Patient identified, Emergency Drugs available, Suction available and Patient being monitored Patient Re-evaluated:Patient Re-evaluated prior to induction Oxygen Delivery Method: Circle system utilized Preoxygenation: Pre-oxygenation with 100% oxygen Induction Type: IV induction and Rapid sequence Laryngoscope Size: Mac and 3 Grade View: Grade I Tube type: Oral Tube size: 7.0 mm Number of attempts: 1 Airway Equipment and Method: Stylet and Bite block Placement Confirmation: ETT inserted through vocal cords under direct vision, positive ETCO2 and breath sounds checked- equal and bilateral Secured at: 22 cm Tube secured with: Tape Dental Injury: Teeth and Oropharynx as per pre-operative assessment

## 2023-04-21 NOTE — Op Note (Signed)
04/21/2023  2:49 PM  PATIENT:  Amanda Shepherd  72 y.o. female  PRE-OPERATIVE DIAGNOSIS:  LEFT BREAST DCIS, RIGHT BREAST PAPILLOMA AND DISCORDANCE  POST-OPERATIVE DIAGNOSIS:  LEFT BREAST DCIS, RIGHT BREAST PAPILLOMA AND DISCORDANCE  PROCEDURE:  Procedure(s): RIGHT BREAST RADIOACTIVE SEED LOCALIZED LUMPECTOMY X 2, LEFT BREAST RADIOACTIVE SEED LOCALIZED LUMPECTOMY  SURGEON:  Surgeon(s) and Role:    * Griselda Miner, MD - Primary  PHYSICIAN ASSISTANT:   ASSISTANTS: none   ANESTHESIA:   local and general  EBL:  15 mL   BLOOD ADMINISTERED:none  DRAINS: none   LOCAL MEDICATIONS USED:  MARCAINE     SPECIMEN:  Source of Specimen:  right breast superior and inferior lumpectomy, left breast lumpectomy  DISPOSITION OF SPECIMEN:  PATHOLOGY  COUNTS:  YES  TOURNIQUET:  * No tourniquets in log *  DICTATION: .Dragon Dictation  After informed consent was obtained the patient was brought to the operating room and placed in the supine position on the operating table.  After adequate induction of general anesthesia the patient's bilateral chest, breast, and axillary areas were prepped with ChloraPrep, allowed to dry, and draped in usual sterile manner.  An appropriate timeout was performed.  Attention was first turned to the right breast.  Previously 2 I-125 seeds were placed in the upper central right breast to mark areas of papilloma and discordance.  The neoprobe was set to I-125 in the area of radioactivity was readily identified.  The area around this was infiltrated with quarter percent Marcaine.  A curvilinear incision was then made with a 15 blade knife along the upper edge of the areola of the right breast.  The incision was carried through the skin and subcutaneous tissue sharply with the electrocautery.  The inferior lesion was just beneath the incision.  Dissection was then carried around the radioactive seed while checking the area of radioactivity frequently.  Once the specimen was  removed it was oriented with the appropriate paint colors.  The second seed was just superior to this 1.  The dissection was then carried around the second seed while checking the area of radioactivity frequently.  Once the specimen was removed it was oriented with the appropriate paint colors.  Of note the inferior edge of the more superior second specimen corresponded to the superior edge of the prior specimen which was more inferior.  Specimen radiograph were obtained of both leads and the clips and seeds were near the center of the specimen.  The 2 right specimens were then sent to pathology for further evaluation.  Hemostasis was achieved using the Bovie electrocautery.  The wound was irrigated with saline and infiltrated with quarter percent Marcaine.  The deep layer of the incision was then closed with interrupted 3-0 Vicryl stitches.  The skin was then closed with interrupted 4-0 Monocryl subcuticular stitches.  Attention was then turned to the left breast.  Previously an I-125 seed was placed in the upper portion of the left breast to mark an area of ductal carcinoma in situ.  The neoprobe was set to I-125 in the area of radioactivity was readily identified.  The area around this was infiltrated with quarter percent Marcaine.  A curvilinear incision was then made along the upper edge of the left areola with a 15 blade knife.  The incision was carried through the skin and subcutaneous tissue sharply with the electrocautery.  Dissection was then carried superiorly between the breast tissue in the subcutaneous fat and skin.  Once this dissection  was well beyond the area of the cancer I then removed a circular portion of breast tissue sharply with the electrocautery around the radioactive seed while checking the area of radioactivity frequently.  Once the specimen was removed it was oriented with the appropriate paint colors.  A specimen radiograph was obtained that showed the clip and seed to be near the  center of the specimen.  The specimen was then sent to pathology for further evaluation.  Of note the anterior margin of the specimen was the skin and the posterior margin was the chest wall muscle.  Hemostasis was achieved using the Bovie electrocautery.  The wound was irrigated with saline and infiltrated with more quarter percent Marcaine.  The deep layer of the incision was then closed with layers of interrupted 3-0 Vicryl stitches.  The skin was then closed with interrupted 4-0 Monocryl subcuticular stitches.  Dermabond dressings were applied.  The patient tolerated the procedure well.  At the end of the case all needle sponge and instrument counts were correct.  The patient was then awakened and taken to recovery in stable condition.  PLAN OF CARE: Discharge to home after PACU  PATIENT DISPOSITION:  PACU - hemodynamically stable.   Delay start of Pharmacological VTE agent (>24hrs) due to surgical blood loss or risk of bleeding: not applicable

## 2023-04-21 NOTE — Discharge Instructions (Signed)
  Post Anesthesia Home Care Instructions  Activity: Get plenty of rest for the remainder of the day. A responsible individual must stay with you for 24 hours following the procedure.  For the next 24 hours, DO NOT: -Drive a car -Advertising copywriter -Drink alcoholic beverages -Take any medication unless instructed by your physician -Make any legal decisions or sign important papers.  Meals: Start with liquid foods such as gelatin or soup. Progress to regular foods as tolerated. Avoid greasy, spicy, heavy foods. If nausea and/or vomiting occur, drink only clear liquids until the nausea and/or vomiting subsides. Call your physician if vomiting continues.  Special Instructions/Symptoms: Your throat may feel dry or sore from the anesthesia or the breathing tube placed in your throat during surgery. If this causes discomfort, gargle with warm salt water. The discomfort should disappear within 24 hours.  If you had a scopolamine patch placed behind your ear for the management of post- operative nausea and/or vomiting:  1. The medication in the patch is effective for 72 hours, after which it should be removed.  Wrap patch in a tissue and discard in the trash. Wash hands thoroughly with soap and water. 2. You may remove the patch earlier than 72 hours if you experience unpleasant side effects which may include dry mouth, dizziness or visual disturbances. 3. Avoid touching the patch. Wash your hands with soap and water after contact with the patch.       Next dose of Tylenol may be given at 7:00pm if needed.

## 2023-04-21 NOTE — Transfer of Care (Signed)
Immediate Anesthesia Transfer of Care Note  Patient: Amanda Shepherd  Procedure(s) Performed: BILATERAL BREAST LUMPECTOMY WITH RADIOACTIVE SEED LOCALIZATION (Bilateral: Breast)  Patient Location: PACU  Anesthesia Type:General  Level of Consciousness: drowsy  Airway & Oxygen Therapy: Patient Spontanous Breathing and Patient connected to face mask oxygen  Post-op Assessment: Report given to RN and Post -op Vital signs reviewed and stable  Post vital signs: Reviewed and stable  Last Vitals:  Vitals Value Taken Time  BP 178/83 04/21/23 1458  Temp 36.3 C 04/21/23 1458  Pulse 93 04/21/23 1458  Resp 17 04/21/23 1458  SpO2 99 % 04/21/23 1458    Last Pain:  Vitals:   04/21/23 1255  TempSrc: Temporal  PainSc: 0-No pain      Patients Stated Pain Goal: 3 (04/21/23 1255)  Complications: No notable events documented.

## 2023-04-21 NOTE — H&P (Signed)
REFERRING PHYSICIAN: Sabas Sous, MD PROVIDER: Lindell Noe, MD MRN: [id number] DOB: [birth year] Subjective   Chief Complaint: No chief complaint on file.  History of Present Illness: Amanda Shepherd is a 72 y.o. female who is seen today as an office consultation for evaluation of No chief complaint on file.  We are asked to see the patient in consultation by Dr. Pamelia Hoit to evaluate her for a new left breast cancer. The patient is a 72 year old black female who recently went for a routine screening mammogram. At that time she was found to have calcifications in the upper portion of the left breast measuring 1.5 cm. These were biopsied and came back as intermediate grade ductal carcinoma in situ that was ER and PR negative. She also was found to have 4 mm of calcification in the retroareolar right breast. This was biopsied twice and the first time was discordant and the second time was a papilloma. She is otherwise in good health except for some diabetes and does not smoke.  Review of Systems: A complete review of systems was obtained from the patient. I have reviewed this information and discussed as appropriate with the patient. See HPI as well for other ROS.  ROS   Medical History: Past Medical History:  Diagnosis Date  Diabetes mellitus without complication (CMS/HHS-HCC)  Hyperlipidemia   Patient Active Problem List  Diagnosis  Intraductal papilloma of right breast  Ductal carcinoma in situ (DCIS) of left breast   Past Surgical History:  Procedure Laterality Date  COLONOSCOPY 07/15/2005  PERCUTANEOUS BIOPSY BREAST Left 03/03/2023  PERCUTANEOUS BIOPSY BREAST 03/03/2023  PERCUTANEOUS BIOPSY BREAST Right 03/12/2023  HYSTERECTOMY    Allergies  Allergen Reactions  Atorvastatin Other (See Comments)  "did not feel right - memory impairment   Current Outpatient Medications on File Prior to Visit  Medication Sig Dispense Refill  azelastine (ASTELIN) 137 mcg nasal spray Place  into one nostril  calcium carbonate-vitamin D3 (OS-CAL 500+D) 500 mg-5 mcg (200 unit) tablet Take 1 tablet by mouth daily with breakfast  cetirizine (ZYRTEC) 10 MG tablet Take 10 mg by mouth once daily as needed  fluticasone propionate (FLONASE) 50 mcg/actuation nasal spray Place 1 spray into one nostril once daily  losartan (COZAAR) 25 MG tablet Take 1 tablet by mouth at bedtime  metFORMIN (GLUCOPHAGE-XR) 500 MG XR tablet Take 500 mg by mouth 2 (two) times daily with meals  rosuvastatin (CRESTOR) 20 MG tablet Take 20 mg by mouth once daily  TRULICITY 3 mg/0.5 mL subcutaneous pen injector Inject subcutaneously  alendronate (FOSAMAX) 70 MG tablet TAKE 1 TABLET BY MOUTH ONCE A WEEK WITH A FULL GLASS OF WATER ON AN EMPTY STOMACH.  multivitamin tablet Take by mouth   No current facility-administered medications on file prior to visit.   Family History  Problem Relation Age of Onset  High blood pressure (Hypertension) Mother    Social History   Tobacco Use  Smoking Status Never  Smokeless Tobacco Never    Social History   Socioeconomic History  Marital status: Single  Tobacco Use  Smoking status: Never  Smokeless tobacco: Never  Substance and Sexual Activity  Alcohol use: Not Currently  Drug use: Never   Social Determinants of Health   Financial Resource Strain: Low Risk (01/07/2023)  Received from [facility]  Overall Financial Resource Strain (CARDIA)  Difficulty of Paying Living Expenses: Not hard at all  Food Insecurity: No Food Insecurity (01/07/2023)  Received from [facility]  Hunger Vital Sign  Worried About  Running Out of Food in the Last Year: Never true  Ran Out of Food in the Last Year: Never true  Transportation Needs: No Transportation Needs (01/07/2023)  Received from [facility] - Transportation  Lack of Transportation (Medical): No  Lack of Transportation (Non-Medical): No  Physical Activity: Inactive (01/07/2023)  Received from [facility]   Exercise Vital Sign  Days of Exercise per Week: 0 days  Minutes of Exercise per Session: 0 min  Stress: No Stress Concern Present (01/07/2023)  Received from [facility] - Occupational Stress Questionnaire  Feeling of Stress : Not at all  Social Connections: Moderately Isolated (01/07/2023)  Received from [facility]  Social Connection and Isolation Panel [NHANES]  Frequency of Communication with Friends and Family: More than three times a week  Frequency of Social Gatherings with Friends and Family: Once a week  Attends Religious Services: More than 4 times per year  Active Member of [employer] or Organizations: No  Attends Banker Meetings: Never  Marital Status: Divorced   Objective:  There were no vitals filed for this visit.  There is no height or weight on file to calculate BMI.  Physical Exam Vitals reviewed.  Constitutional:  General: She is not in acute distress. Appearance: Normal appearance.  HENT:  Head: Normocephalic and atraumatic.  Right Ear: External ear normal.  Left Ear: External ear normal.  Nose: Nose normal.  Mouth/Throat:  Mouth: Mucous membranes are moist.  Pharynx: Oropharynx is clear.  Eyes:  General: No scleral icterus. Extraocular Movements: Extraocular movements intact.  Conjunctiva/sclera: Conjunctivae normal.  Pupils: Pupils are equal, round, and reactive to light.  Cardiovascular:  Rate and Rhythm: Normal rate and regular rhythm.  Pulses: Normal pulses.  Heart sounds: Normal heart sounds.  Pulmonary:  Effort: Pulmonary effort is normal. No respiratory distress.  Breath sounds: Normal breath sounds.  Abdominal:  General: Bowel sounds are normal.  Palpations: Abdomen is soft.  Tenderness: There is no abdominal tenderness.  Musculoskeletal:  General: No swelling, tenderness or deformity. Normal range of motion.  Cervical back: Normal range of motion and neck supple.  Skin: General: Skin  is warm and dry.  Coloration: Skin is not jaundiced.  Neurological:  General: No focal deficit present.  Mental Status: She is alert and oriented to person, place, and time.  Psychiatric:  Mood and Affect: Mood normal.  Behavior: Behavior normal.     Breast: There is no palpable mass in either breast. There is no palpable axillary, supraclavicular, or cervical lymphadenopathy.  Labs, Imaging and Diagnostic Testing:  Assessment and Plan:   Diagnoses and all orders for this visit:  Intraductal papilloma of right breast  Ductal carcinoma in situ (DCIS) of left breast   The patient appears to have a 1.5 cm area of ductal carcinoma in situ in the upper portion of the left breast as well as 2 benign findings that need to be excised in the right breast. I have discussed with her the different options for treatment and at this point she favors breast conservation which I feel is very reasonable. She will not need a node evaluation. I have discussed with her in detail the risks and benefits of the surgery as well as some of the technical aspects including the use of radioactive seeds for localization and she understands and wishes to proceed. She will also meet with medical and radiation oncology to discuss adjuvant therapy. We will begin surgical scheduling.

## 2023-04-21 NOTE — Anesthesia Preprocedure Evaluation (Addendum)
Anesthesia Evaluation  Patient identified by MRN, date of birth, ID band Patient awake    Reviewed: Allergy & Precautions, NPO status , Patient's Chart, lab work & pertinent test results  History of Anesthesia Complications Negative for: history of anesthetic complications  Airway Mallampati: II  TM Distance: >3 FB Neck ROM: Full    Dental  (+) Edentulous Upper, Edentulous Lower, Dental Advisory Given   Pulmonary neg pulmonary ROS   Pulmonary exam normal breath sounds clear to auscultation       Cardiovascular hypertension (losartan (stopped a week ago)), Pt. on medications (-) angina (-) Past MI, (-) Cardiac Stents, (-) CABG and (-) Orthopnea (-) dysrhythmias  Rhythm:Regular Rate:Normal  HLD   Neuro/Psych negative neurological ROS     GI/Hepatic negative GI ROS, Neg liver ROS,,,  Endo/Other  diabetes, Type 2, Oral Hypoglycemic Agents    Renal/GU CRFRenal disease     Musculoskeletal osteoporosis   Abdominal   Peds  Hematology negative hematology ROS (+)   Anesthesia Other Findings Left breast cancer  Last Trulicity: 04/15/2023  Reproductive/Obstetrics                             Anesthesia Physical Anesthesia Plan  ASA: 2  Anesthesia Plan: General   Post-op Pain Management: Tylenol PO (pre-op)*   Induction: Intravenous  PONV Risk Score and Plan: 3 and Ondansetron, Dexamethasone and Treatment may vary due to age or medical condition  Airway Management Planned: Oral ETT  Additional Equipment:   Intra-op Plan:   Post-operative Plan: Extubation in OR  Informed Consent: I have reviewed the patients History and Physical, chart, labs and discussed the procedure including the risks, benefits and alternatives for the proposed anesthesia with the patient or authorized representative who has indicated his/her understanding and acceptance.     Dental advisory given  Plan Discussed  with: CRNA and Anesthesiologist  Anesthesia Plan Comments: (Risks of general anesthesia discussed including, but not limited to, sore throat, hoarse voice, chipped/damaged teeth, injury to vocal cords, nausea and vomiting, allergic reactions, lung infection, heart attack, stroke, and death. All questions answered. )        Anesthesia Quick Evaluation

## 2023-04-21 NOTE — Interval H&P Note (Signed)
History and Physical Interval Note:  04/21/2023 1:03 PM  Amanda Shepherd  has presented today for surgery, with the diagnosis of LEFT BREAST DCIS, RIGHT BREAST PAPILLOMA AND DISCORDANCE.  The various methods of treatment have been discussed with the patient and family. After consideration of risks, benefits and other options for treatment, the patient has consented to  Procedure(s): BILATERAL BREAST LUMPECTOMY WITH RADIOACTIVE SEED LOCALIZATION (Bilateral) as a surgical intervention.  The patient's history has been reviewed, patient examined, no change in status, stable for surgery.  I have reviewed the patient's chart and labs.  Questions were answered to the patient's satisfaction.     Chevis Pretty III

## 2023-04-21 NOTE — Anesthesia Postprocedure Evaluation (Signed)
Anesthesia Post Note  Patient: Amanda Shepherd  Procedure(s) Performed: BILATERAL BREAST LUMPECTOMY WITH RADIOACTIVE SEED LOCALIZATION (Bilateral: Breast)     Patient location during evaluation: PACU Anesthesia Type: General Level of consciousness: awake Pain management: pain level controlled Vital Signs Assessment: post-procedure vital signs reviewed and stable Respiratory status: spontaneous breathing, nonlabored ventilation and respiratory function stable Cardiovascular status: blood pressure returned to baseline and stable Postop Assessment: no apparent nausea or vomiting Anesthetic complications: no   No notable events documented.  Last Vitals:  Vitals:   04/21/23 1527 04/21/23 1538  BP: (!) 142/70 (!) 148/74  Pulse: 78 76  Resp: 13 18  Temp:  36.7 C  SpO2: 97% 99%    Last Pain:  Vitals:   04/21/23 1538  TempSrc: Oral  PainSc: 0-No pain                 Linton Rump

## 2023-04-23 ENCOUNTER — Encounter (HOSPITAL_BASED_OUTPATIENT_CLINIC_OR_DEPARTMENT_OTHER): Payer: Self-pay | Admitting: General Surgery

## 2023-04-27 ENCOUNTER — Encounter: Payer: Self-pay | Admitting: *Deleted

## 2023-04-27 LAB — SURGICAL PATHOLOGY

## 2023-05-01 NOTE — Progress Notes (Signed)
Patient Care Team: Lockie Mola, MD as PCP - General (Family Medicine) Dorothy Puffer, MD as Consulting Physician (Radiation Oncology) Serena Croissant, MD as Consulting Physician (Hematology and Oncology) Griselda Miner, MD as Consulting Physician (General Surgery) Pershing Proud, RN as Oncology Nurse Navigator Donnelly Angelica, RN as Oncology Nurse Navigator  DIAGNOSIS:  Encounter Diagnosis  Name Primary?   Ductal carcinoma in situ (DCIS) of left breast Yes    SUMMARY OF ONCOLOGIC HISTORY: Oncology History  Ductal carcinoma in situ (DCIS) of left breast  03/12/2023 Initial Diagnosis   Screening mammogram detected bilateral breast calcifications.  Left breast 1.5 cm biopsy: DCIS intermediate grade solid and cribriform type with necrosis ER 0%, PR 0%.  Right breast retroareolar calcifications 0.4 cm stereotactic biopsy was benign discordant rebiopsy intraductal papilloma   03/17/2023 Cancer Staging   Staging form: Breast, AJCC 8th Edition - Clinical stage from 03/17/2023: Stage 0 (cTis (DCIS), cN0, cM0, ER-, PR-, HER2: Not Assessed) - Signed by Serena Croissant, MD on 03/17/2023 Stage prefix: Initial diagnosis Nuclear grade: G2 Laterality: Left Staged by: Pathologist and managing physician National guidelines used in treatment planning: Yes Type of national guideline used in treatment planning: NCCN   03/24/2023 Genetic Testing   Negative [facility] Multi-Cancer + RNA Panel. VUS detected in APC c.4886A>G (p.His1629Arg) and TSC2 c.-30+1G>T (Splice donor). Report date is March 24, 2023.   The Multi-Cancer + RNA Panel offered by [facility] includes sequencing and/or deletion/duplication analysis of the following 70 genes:  AIP*, ALK, APC*, ATM*, AXIN2*, BAP1*, BARD1*, BLM*, BMPR1A*, BRCA1*, BRCA2*, BRIP1*, CDC73*, CDH1*, CDK4, CDKN1B*, CDKN2A, CHEK2*, CTNNA1*, DICER1*, EPCAM (del/dup only), EGFR, FH*, FLCN*, GREM1 (promoter dup only), HOXB13, KIT, LZTR1, MAX*, MBD4, MEN1*, MET, MITF, MLH1*, MSH2*,  MSH3*, MSH6*, MUTYH*, NF1*, NF2*, NTHL1*, PALB2*, PDGFRA, PMS2*, POLD1*, POLE*, POT1*, PRKAR1A*, PTCH1*, PTEN*, RAD51C*, RAD51D*, RB1*, RET, SDHA* (sequencing only), SDHAF2*, SDHB*, SDHC*, SDHD*, SMAD4*, SMARCA4*, SMARCB1*, SMARCE1*, STK11*, SUFU*, TMEM127*, TP53*, TSC1*, TSC2*, VHL*. RNA analysis is performed for * genes.   04/21/2023 Surgery   Bilateral lumpectomies: Right lumpectomy: Intraductal papilloma 4 mm, right superior lumpectomy: Benign;  Left lumpectomy: 1.1 cm DCIS grade 2, margins negative, ER 0%, PR 0%     CHIEF COMPLIANT: Follow-up post op  INTERVAL HISTORY: Amanda Shepherd is a 72 y.o. female is here because of recent diagnosis of left breast DCIS.  She presents to the clinic for a follow-up.  She tolerated surgery extremely well and is without any pain or discomfort.    ALLERGIES:  is allergic to atorvastatin.  MEDICATIONS:  Current Outpatient Medications  Medication Sig Dispense Refill   alendronate (FOSAMAX) 70 MG tablet TAKE 1 TABLET BY MOUTH ONCE A WEEK WITH A FULL GLASS OF WATER ON AN EMPTY STOMACH. 4 tablet 0   azelastine (ASTELIN) 0.1 % nasal spray Place 1 spray into both nostrils 2 (two) times daily. Use in each nostril as directed (Patient taking differently: Place 1 spray into both nostrils 2 (two) times daily as needed for allergies. Use in each nostril as directed) 30 mL 0   Blood Glucose Monitoring Suppl (ONE TOUCH ULTRA 2) w/Device KIT USE TO CHECK GLUCOSE BEFORE MEAL(S) AND  AT BEDTIME 1 kit 0   calcium-vitamin D (OSCAL WITH D) 500-5 MG-MCG tablet Take 1 tablet by mouth daily with breakfast. 90 tablet 0   cetirizine (ZYRTEC) 10 MG tablet Take 1 tablet (10 mg total) by mouth daily as needed for allergies. 30 tablet 4   dapagliflozin propanediol (FARXIGA) 10 MG  TABS tablet Take 1 tablet by mouth once daily 90 tablet 0   Dulaglutide (TRULICITY) 3 MG/0.5ML SOPN Inject 3 mg into the skin once a week. 6 mL 0   fluticasone (FLONASE) 50 MCG/ACT nasal spray Place 1 spray  into both nostrils daily. 16 g 2   glucose blood (ONETOUCH ULTRA) test strip Use to check blood sugar up to 4 times daily 100 each 1   Lancets (ONETOUCH DELICA PLUS LANCET33G) MISC USE TO CHECK BLOOD SUGARS FOUR TIMES DAILY. 100 each 0   loratadine (CLARITIN) 10 MG tablet Take 1 tablet (10 mg total) by mouth daily. 30 tablet 1   losartan (COZAAR) 25 MG tablet TAKE 1 TABLET BY MOUTH AT BEDTIME 90 tablet 0   metFORMIN (GLUCOPHAGE-XR) 500 MG 24 hr tablet TAKE 1 TABLET BY MOUTH TWICE DAILY WITH A MEAL 180 tablet 0   Multiple Vitamin (MULTIVITAMIN) tablet Take 1 tablet by mouth daily. Centrum Silver     oxyCODONE (ROXICODONE) 5 MG immediate release tablet Take 1 tablet (5 mg total) by mouth every 6 (six) hours as needed for severe pain. 10 tablet 0   rosuvastatin (CRESTOR) 20 MG tablet Take 1 tablet (20 mg total) by mouth daily. 90 tablet 3   No current facility-administered medications for this visit.    PHYSICAL EXAMINATION: ECOG PERFORMANCE STATUS: 0 - Asymptomatic  Vitals:   05/03/23 1455  BP: 134/86  Pulse: 82  Resp: 18  Temp: 97.8 F (36.6 C)  SpO2: 100%   Filed Weights   05/03/23 1455  Weight: 122 lb (55.3 kg)     LABORATORY DATA:  I have reviewed the data as listed    Latest Ref Rng & Units 04/20/2023   10:30 AM 03/17/2023   11:59 AM 01/06/2023    1:05 AM  CMP  Glucose 70 - 99 mg/dL 865  784  696   BUN 8 - 23 mg/dL 30  26  22    Creatinine 0.44 - 1.00 mg/dL 2.95  2.84  1.32   Sodium 135 - 145 mmol/L 139  138  135   Potassium 3.5 - 5.1 mmol/L 4.2  4.4  3.7   Chloride 98 - 111 mmol/L 108  105  102   CO2 22 - 32 mmol/L 23  28  22    Calcium 8.9 - 10.3 mg/dL 9.1  9.3  9.7   Total Protein 6.5 - 8.1 g/dL  8.3  7.5   Total Bilirubin 0.3 - 1.2 mg/dL  0.6  1.0   Alkaline Phos 38 - 126 U/L  90  82   AST 15 - 41 U/L  22  21   ALT 0 - 44 U/L  16  17     Lab Results  Component Value Date   WBC 5.9 03/17/2023   HGB 11.5 (L) 03/17/2023   HCT 35.6 (L) 03/17/2023   MCV 93.0  03/17/2023   PLT 322 03/17/2023   NEUTROABS 3.3 03/17/2023    ASSESSMENT & PLAN:  Ductal carcinoma in situ (DCIS) of left breast 03/12/2023:Screening mammogram detected bilateral breast calcifications.  Left breast 1.5 cm biopsy: DCIS intermediate grade solid and cribriform type with necrosis ER 0%, PR 0%.  Right breast retroareolar calcifications 0.4 cm stereotactic biopsy was benign discordant rebiopsy intraductal papilloma   04/21/2023: Right lumpectomy: Intraductal papilloma 4 mm, right superior lumpectomy: Benign; left lumpectomy: 1.1 cm DCIS grade 2, margins negative, ER 0%, PR 0%  Pathology counseling: I discussed the final pathology report of the patient provided  a copy of this report. I discussed the margins as well as lymph node surgeries. We also discussed the final staging along with previously performed ER/PR and HER-2/neu testing.  Treatment plan:Adjuvant radiation therapy followed by surveillance.    Return to clinic in 3 months for survivorship care plan visit and after that she can be followed annually for long-term survivorship    No orders of the defined types were placed in this encounter.  The patient has a good understanding of the overall plan. she agrees with it. she will call with any problems that may develop before the next visit here. Total time spent: 30 mins including face to face time and time spent for planning, charting and co-ordination of care   Tamsen Meek, MD 05/03/23    I Janan Ridge am acting as a Neurosurgeon for [employer]  I have reviewed the above documentation for accuracy and completeness, and I agree with the above.

## 2023-05-03 ENCOUNTER — Inpatient Hospital Stay: Payer: Medicare Other | Attending: Hematology and Oncology | Admitting: Hematology and Oncology

## 2023-05-03 VITALS — BP 134/86 | HR 82 | Temp 97.8°F | Resp 18 | Ht 66.0 in | Wt 122.0 lb

## 2023-05-03 DIAGNOSIS — D0512 Intraductal carcinoma in situ of left breast: Secondary | ICD-10-CM | POA: Insufficient documentation

## 2023-05-03 NOTE — Assessment & Plan Note (Signed)
03/12/2023:Screening mammogram detected bilateral breast calcifications.  Left breast 1.5 cm biopsy: DCIS intermediate grade solid and cribriform type with necrosis ER 0%, PR 0%.  Right breast retroareolar calcifications 0.4 cm stereotactic biopsy was benign discordant rebiopsy intraductal papilloma   04/21/2023: Right lumpectomy: Intraductal papilloma 4 mm, right superior lumpectomy: Benign; left lumpectomy: 1.1 cm DCIS grade 2, margins negative, ER 0%, PR 0%  Pathology counseling: I discussed the final pathology report of the patient provided  a copy of this report. I discussed the margins as well as lymph node surgeries. We also discussed the final staging along with previously performed ER/PR and HER-2/neu testing.  Treatment plan:Adjuvant radiation therapy followed by surveillance.    Return to clinic in 3 months for survivorship care plan visit

## 2023-05-06 ENCOUNTER — Other Ambulatory Visit (HOSPITAL_COMMUNITY): Payer: Self-pay

## 2023-05-06 MED ORDER — TRULICITY 3 MG/0.5ML ~~LOC~~ SOAJ
3.0000 mg | SUBCUTANEOUS | 1 refills | Status: DC
  Filled 2023-06-16: qty 2, 28d supply, fill #0

## 2023-05-06 MED ORDER — FLUTICASONE PROPIONATE 50 MCG/ACT NA SUSP
1.0000 | Freq: Every day | NASAL | 2 refills | Status: DC
  Filled 2023-05-06: qty 16, 60d supply, fill #0

## 2023-05-06 MED FILL — Losartan Potassium Tab 25 MG: ORAL | 90 days supply | Qty: 90 | Fill #0 | Status: CN

## 2023-05-13 ENCOUNTER — Telehealth: Payer: Self-pay | Admitting: Family Medicine

## 2023-05-13 NOTE — Telephone Encounter (Signed)
Patient came in wanting to update her pharmacy. She is no longer using the [facility], she is using the [facility] at [facility]

## 2023-05-18 ENCOUNTER — Other Ambulatory Visit (HOSPITAL_COMMUNITY): Payer: Self-pay

## 2023-05-19 ENCOUNTER — Ambulatory Visit: Payer: Medicare Other | Admitting: Student

## 2023-05-20 ENCOUNTER — Telehealth: Payer: Self-pay | Admitting: Genetic Counselor

## 2023-05-20 NOTE — Telephone Encounter (Signed)
Called patient in attempt to disclose genetics results.  Patient answered but said she was unable to talk.  Requested a call back.

## 2023-05-21 ENCOUNTER — Ambulatory Visit: Payer: Self-pay | Admitting: Genetic Counselor

## 2023-05-21 DIAGNOSIS — Z1379 Encounter for other screening for genetic and chromosomal anomalies: Secondary | ICD-10-CM

## 2023-05-21 DIAGNOSIS — D0512 Intraductal carcinoma in situ of left breast: Secondary | ICD-10-CM

## 2023-05-21 NOTE — Telephone Encounter (Signed)
Disclosed negative genetics.    

## 2023-05-21 NOTE — Progress Notes (Signed)
HPI:   [name] was previously seen in the [facility] due to a personal history of breast cancer and concerns regarding a hereditary predisposition to cancer.    [name] recent genetic test results were disclosed to her by telephone. These results and recommendations are discussed in more detail below.  CANCER HISTORY:  Oncology History  Ductal carcinoma in situ (DCIS) of left breast  03/12/2023 Initial Diagnosis   Screening mammogram detected bilateral breast calcifications.  Left breast 1.5 cm biopsy: DCIS intermediate grade solid and cribriform type with necrosis ER 0%, PR 0%.  Right breast retroareolar calcifications 0.4 cm stereotactic biopsy was benign discordant rebiopsy intraductal papilloma   03/17/2023 Cancer Staging   Staging form: Breast, AJCC 8th Edition - Clinical stage from 03/17/2023: Stage 0 (cTis (DCIS), cN0, cM0, ER-, PR-, HER2: Not Assessed) - Signed by Serena Croissant, MD on 03/17/2023 Stage prefix: Initial diagnosis Nuclear grade: G2 Laterality: Left Staged by: Pathologist and managing physician National guidelines used in treatment planning: Yes Type of national guideline used in treatment planning: NCCN   03/24/2023 Genetic Testing   Negative [facility] Multi-Cancer + RNA Panel. VUS detected in APC c.4886A>G (p.His1629Arg) and TSC2 c.-30+1G>T (Splice donor). Report date is March 24, 2023.   The Multi-Cancer + RNA Panel offered by [facility] includes sequencing and/or deletion/duplication analysis of the following 70 genes:  AIP*, ALK, APC*, ATM*, AXIN2*, BAP1*, BARD1*, BLM*, BMPR1A*, BRCA1*, BRCA2*, BRIP1*, CDC73*, CDH1*, CDK4, CDKN1B*, CDKN2A, CHEK2*, CTNNA1*, DICER1*, EPCAM (del/dup only), EGFR, FH*, FLCN*, GREM1 (promoter dup only), HOXB13, KIT, LZTR1, MAX*, MBD4, MEN1*, MET, MITF, MLH1*, MSH2*, MSH3*, MSH6*, MUTYH*, NF1*, NF2*, NTHL1*, PALB2*, PDGFRA, PMS2*, POLD1*, POLE*, POT1*, PRKAR1A*, PTCH1*, PTEN*, RAD51C*, RAD51D*, RB1*, RET, SDHA* (sequencing  only), SDHAF2*, SDHB*, SDHC*, SDHD*, SMAD4*, SMARCA4*, SMARCB1*, SMARCE1*, STK11*, SUFU*, TMEM127*, TP53*, TSC1*, TSC2*, VHL*. RNA analysis is performed for * genes.   04/21/2023 Surgery   Bilateral lumpectomies: Right lumpectomy: Intraductal papilloma 4 mm, right superior lumpectomy: Benign;  Left lumpectomy: 1.1 cm DCIS grade 2, margins negative, ER 0%, PR 0%      FAMILY HISTORY:  We obtained a detailed, 4-generation family history.  Significant diagnoses are listed below:      Family History  Problem Relation Age of Onset   Thyroid cancer Son          dx 51s               [name] described cancer in several generations of her paternal family and declined providing additional information. [name] is unaware of previous family history of genetic testing for hereditary cancer risks. There is no reported Ashkenazi Jewish ancestry.       GENETIC TEST RESULTS:  The [facility] Multi-Cancer +RNA Panel found no pathogenic mutations.   The Multi-Cancer + RNA Panel offered by [facility] includes sequencing and/or deletion/duplication analysis of the following 70 genes:  AIP*, ALK, APC*, ATM*, AXIN2*, BAP1*, BARD1*, BLM*, BMPR1A*, BRCA1*, BRCA2*, BRIP1*, CDC73*, CDH1*, CDK4, CDKN1B*, CDKN2A, CHEK2*, CTNNA1*, DICER1*, EPCAM (del/dup only), EGFR, FH*, FLCN*, GREM1 (promoter dup only), HOXB13, KIT, LZTR1, MAX*, MBD4, MEN1*, MET, MITF, MLH1*, MSH2*, MSH3*, MSH6*, MUTYH*, NF1*, NF2*, NTHL1*, PALB2*, PDGFRA, PMS2*, POLD1*, POLE*, POT1*, PRKAR1A*, PTCH1*, PTEN*, RAD51C*, RAD51D*, RB1*, RET, SDHA* (sequencing only), SDHAF2*, SDHB*, SDHC*, SDHD*, SMAD4*, SMARCA4*, SMARCB1*, SMARCE1*, STK11*, SUFU*, TMEM127*, TP53*, TSC1*, TSC2*, VHL*. RNA analysis is performed for * genes. .   The test report has been scanned into EPIC and is located under the Molecular Pathology section of the  Results Review tab.  A portion of the result report is included below for reference. Genetic testing reported out on March 24, 2023.    Genetic testing did identify two variants of uncertain significance (VUS) - one at [facility] c.4886A>G [phone]) and another at TSC2 c.-30+1G>T (Splice donor).   At this time, it is unknown if these variants are associated with increased cancer risk or if they are normal findings, but most variants such as these get reclassified to being inconsequential. They should not be used to make medical management decisions. With time, we suspect the lab will determine the significance of these variants, if any. If we do learn more about them, we will try to contact [name] to discuss it further. However, it is important to stay in touch with [place] periodically and keep the address and phone number up to date.   Even though a pathogenic variant was not identified, possible explanations for the cancer in the family may include: There may be no hereditary risk for cancer in the family. The cancers in [name] and/or her family may be sporadic/familial or due to other genetic and environmental factors. There may be a gene mutation in one of these genes that current testing methods cannot detect but that chance is small. There could be another gene that has not yet been discovered, or that we have not yet tested, that is responsible for the cancer diagnoses in the family.  It is also possible there is a hereditary cause for the cancer in the family that [name] did not inherit.   Therefore, it is important to remain in touch with cancer genetics in the future so that we can continue to offer [name] the most up to date genetic testing.     ADDITIONAL GENETIC TESTING:   [name] genetic testing was fairly extensive.  If there are additional relevant genes identified to increase cancer risk that can be analyzed in the future, we would be happy to discuss and coordinate this testing at that time.     CANCER SCREENING RECOMMENDATIONS:  [name] test result is considered negative (normal).  This  means that we have not identified a hereditary cause for her personal history of breast cancer at this time.   An individual's cancer risk and medical management are not determined by genetic test results alone. Overall cancer risk assessment incorporates additional factors, including personal medical history, family history, and any available genetic information that may result in a personalized plan for cancer prevention and surveillance. Therefore, it is recommended she continue to follow the cancer management and screening guidelines provided by her oncology and primary healthcare provider.  RECOMMENDATIONS FOR FAMILY MEMBERS:   Since she did not inherit a identifiable mutation in a cancer predisposition gene included on this panel, her children could not have inherited a known mutation from her in one of these genes. Individuals in this family might be at some increased risk of developing cancer, over the general population risk, due to the family history of cancer.  Individuals in the family should notify their providers of the family history of cancer. We recommend women in this family have a yearly mammogram beginning at age 54, or 67 years younger than the earliest onset of cancer, an annual clinical breast exam, and perform monthly breast self-exams.  Risk models that take into account family history and hormonal history may be helpful in determining appropriate breast cancer screening options for family members.  Other members of  the family may still carry a pathogenic variant in one of these genes that [name] did not inherit. We discussed that paternal relatives may be good candidates for hereditary cancer genetic testing depending on cancer type and age of diagnosis.  We do not recommend familial testing for the TSC2 or APC variants of uncertain significance (VUS).  FOLLOW-UP:  Cancer genetics is a rapidly advancing field and it is possible that new genetic tests will be appropriate for  her and/or her family members in the future. We encourage [name] to remain in contact with cancer genetics, so we can update her personal and family histories and let her know of advances in cancer genetics that may benefit this family.   Our contact number was provided.  She is welcome to call us at anytime with additional questions or concerns.   Maverick Patman M. Rennie Plowman, MS, [facility] Genetic Counselor [email or web address] (P) [phone]

## 2023-05-25 ENCOUNTER — Other Ambulatory Visit: Payer: Self-pay

## 2023-05-25 ENCOUNTER — Ambulatory Visit
Admission: RE | Admit: 2023-05-25 | Discharge: 2023-05-25 | Disposition: A | Discharge status: Home / Self Care | Payer: Medicare Other | Source: Ambulatory Visit | Attending: Radiation Oncology | Admitting: Radiation Oncology

## 2023-05-25 ENCOUNTER — Encounter: Payer: Self-pay | Admitting: Radiation Oncology

## 2023-05-25 VITALS — BP 111/74 | HR 75 | Temp 97.5°F | Resp 18 | Ht 66.0 in | Wt 119.0 lb

## 2023-05-25 DIAGNOSIS — D0512 Intraductal carcinoma in situ of left breast: Secondary | ICD-10-CM

## 2023-05-25 DIAGNOSIS — I10 Essential (primary) hypertension: Secondary | ICD-10-CM | POA: Diagnosis not present

## 2023-05-25 DIAGNOSIS — Z510 Encounter for antineoplastic radiation therapy: Secondary | ICD-10-CM | POA: Diagnosis present

## 2023-05-25 DIAGNOSIS — E119 Type 2 diabetes mellitus without complications: Secondary | ICD-10-CM | POA: Diagnosis not present

## 2023-05-25 DIAGNOSIS — Z79899 Other long term (current) drug therapy: Secondary | ICD-10-CM | POA: Insufficient documentation

## 2023-05-25 DIAGNOSIS — Z170 Estrogen receptor positive status [ER+]: Secondary | ICD-10-CM | POA: Diagnosis not present

## 2023-05-25 DIAGNOSIS — Z7984 Long term (current) use of oral hypoglycemic drugs: Secondary | ICD-10-CM | POA: Insufficient documentation

## 2023-05-25 DIAGNOSIS — E785 Hyperlipidemia, unspecified: Secondary | ICD-10-CM | POA: Insufficient documentation

## 2023-05-25 DIAGNOSIS — Z7985 Long-term (current) use of injectable non-insulin antidiabetic drugs: Secondary | ICD-10-CM | POA: Diagnosis not present

## 2023-05-25 NOTE — Progress Notes (Signed)
Radiation Oncology         (336) [phone] ________________________________  Name: Amanda Shepherd        MRN: [id number]  Date of Service: 05/25/2023 DOB: 30-Oct-1950  PI:RJJOAC, Overton Mam, MD  Amanda Croissant, MD     REFERRING PHYSICIAN: Serena Croissant, MD   DIAGNOSIS: The encounter diagnosis was Ductal carcinoma in situ (DCIS) of left breast.    HISTORY OF PRESENT ILLNESS: Amanda Shepherd is a 72 y.o. female originally seen in the multidisciplinary breast clinic for a new diagnosis of left breast cancer. The patient was noted to have calcifications in the right breast and possible asymmetry and separate calcifications in left breast on screening mammogram on 08/13/23. 6 month follow diagnostic mammogram on 02/26/23 showed indeterminate calcifications in the retroareolar region of the right breast and indeterminate calcifications in the upper central left breast. Biopsy of both sites was recommended.A biopsy of the left breast showed intermediate grade ductal carcinoma in situ (DCIS) with necrosis and calcifications present. Prognostics showed estrogen and progesterone receptor negative. Biopsy of the right breast showed benign breast tissue was negative for malignancy.  Patient was asked to return for repeat mammogram of the right breast to evaluate the biopsy site and determine if there are residual calcifications to target for biopsy. Diagnostic mammogram on 03/05/23 showed residual calcifications in the retroareolar right breast. Repeat biopsy of the right breast on 03/12/23 showed minute fragments of intraductal papilloma with fibrocystic changes. No evidence of carcinoma was seen.   Since her last visit, the patient has undergone bilateral lumpectomies on 04/21/2023.  The right inferior lumpectomy showed an intraductal papilloma measuring 4 mm that was negative for invasive or in situ disease, and the right superior lumpectomy showed benign breast tissue with fibrocystic change also negative for malignancy.   The left lumpectomy showed intermediate grade DCIS measuring 1.1 cm in greatest dimension, the margins were considered negative the closest being 0.5 mm from the posterior margin, her tumor was ER/PR negative. She has been healing and saw Dr. Carolynne Shepherd on 05/13/2023, he feels she is doing well and she is now ready to discuss adjuvant radiotherapy which she is seen for today.  PREVIOUS RADIATION THERAPY: No   PAST MEDICAL HISTORY:  Past Medical History:  Diagnosis Date   Bronchitis    Cough in adult 10/27/2013   Diabetes mellitus    Hyperlipidemia    Hypertension        PAST SURGICAL HISTORY: Past Surgical History:  Procedure Laterality Date   ABDOMINAL HYSTERECTOMY     BREAST BIOPSY Left 03/03/2023   MM LT BREAST BX W LOC DEV 1ST LESION IMAGE BX SPEC STEREO GUIDE 03/03/2023 [facility]   BREAST BIOPSY Right 03/03/2023   MM RT BREAST BX W LOC DEV 1ST LESION IMAGE BX SPEC STEREO GUIDE 03/03/2023 [facility]   BREAST BIOPSY Right 03/12/2023   MM RT BREAST BX W LOC DEV 1ST LESION IMAGE BX SPEC STEREO GUIDE 03/12/2023 [facility]   BREAST BIOPSY  04/20/2023   MM LT RADIOACTIVE SEED LOC MAMMO GUIDE 04/20/2023 [facility]   BREAST BIOPSY  04/20/2023   MM RT RADIOACTIVE SEED LOC MAMMO GUIDE 04/20/2023 [facility]   BREAST BIOPSY  04/20/2023   MM RT RADIOACTIVE SEED EA ADD LESION LOC MAMMO GUIDE 04/20/2023 [facility]   BREAST LUMPECTOMY WITH RADIOACTIVE SEED LOCALIZATION Bilateral 04/21/2023   Procedure: BILATERAL BREAST LUMPECTOMY WITH RADIOACTIVE SEED LOCALIZATION;  Surgeon: Griselda Miner, MD;  Location: [facility];  Service:  General;  Laterality: Bilateral;   COLONOSCOPY  07/15/2005   at [facility]-normal     FAMILY HISTORY:  Family History  Problem Relation Age of Onset   Hypertension Mother    Thyroid cancer Son        dx 32s   Colon cancer Neg Hx      SOCIAL HISTORY:  reports that she has never smoked. She has never used smokeless tobacco. She  reports that she does not drink alcohol and does not use drugs. The patient is single and lives in [place]. 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    ALLERGIES: Atorvastatin   MEDICATIONS:  Current Outpatient Medications  Medication Sig Dispense Refill   alendronate (FOSAMAX) 70 MG tablet TAKE 1 TABLET BY MOUTH ONCE A WEEK WITH A FULL GLASS OF WATER ON AN EMPTY STOMACH. 4 tablet 0   azelastine (ASTELIN) 0.1 % nasal spray Place 1 spray into both nostrils 2 (two) times daily. Use in each nostril as directed (Patient taking differently: Place 1 spray into both nostrils 2 (two) times daily as needed for allergies. Use in each nostril as directed) 30 mL 0   Blood Glucose Monitoring Suppl (ONE TOUCH ULTRA 2) w/Device KIT USE TO CHECK GLUCOSE BEFORE MEAL(S) AND  AT BEDTIME 1 kit 0   calcium-vitamin D (OSCAL WITH D) 500-5 MG-MCG tablet Take 1 tablet by mouth daily with breakfast. 90 tablet 0   cetirizine (ZYRTEC) 10 MG tablet Take 1 tablet (10 mg total) by mouth daily as needed for allergies. 30 tablet 4   dapagliflozin propanediol (FARXIGA) 10 MG TABS tablet Take 1 tablet by mouth once daily 90 tablet 0   Dulaglutide (TRULICITY) 3 MG/0.5ML SOPN Inject 3 mg into the skin once a week. 6 mL 1   fluticasone (FLONASE) 50 MCG/ACT nasal spray Place 1 spray into both nostrils daily. 16 g 2   fluticasone (FLONASE) 50 MCG/ACT nasal spray Place 1 spray into both nostrils daily. 16 g 2   glucose blood (ONETOUCH ULTRA) test strip Use to check blood sugar up to 4 times daily 100 each 1   Lancets (ONETOUCH DELICA PLUS LANCET33G) MISC USE TO CHECK BLOOD SUGARS FOUR TIMES DAILY. 100 each 0   loratadine (CLARITIN) 10 MG tablet Take 1 tablet (10 mg total) by mouth daily. 30 tablet 1   losartan (COZAAR) 25 MG tablet Take 1 tablet (25 mg total) by mouth at bedtime. 90 tablet 0   metFORMIN (GLUCOPHAGE-XR) 500 MG 24 hr tablet TAKE 1 TABLET BY MOUTH TWICE DAILY WITH A MEAL 180 tablet 0   Multiple Vitamin  (MULTIVITAMIN) tablet Take 1 tablet by mouth daily. Centrum Silver     oxyCODONE (ROXICODONE) 5 MG immediate release tablet Take 1 tablet (5 mg total) by mouth every 6 (six) hours as needed for severe pain. 10 tablet 0   rosuvastatin (CRESTOR) 20 MG tablet Take 1 tablet (20 mg total) by mouth daily. 90 tablet 3   No current facility-administered medications for this encounter.     REVIEW OF SYSTEMS: On review of systems, the patient reports that she is doing well overall since surgery without pain or concerns with her recovery. No other complaints are verbalized.     PHYSICAL EXAM:  Wt Readings from Last 3 Encounters:  05/25/23 119 lb (54 kg)  05/03/23 122 lb (55.3 kg)  04/21/23 122 lb 2.2 oz (55.4 kg)   Temp Readings from Last 3 Encounters:  05/25/23 (!) 97.5 F (36.4 C) (  Temporal)  05/03/23 97.8 F (36.6 C) (Temporal)  04/21/23 98 F (36.7 C) (Oral)   BP Readings from Last 3 Encounters:  05/25/23 111/74  05/03/23 134/86  04/21/23 (!) 148/74   Pulse Readings from Last 3 Encounters:  05/25/23 75  05/03/23 82  04/21/23 76    In general this is a well appearing female in no acute distress. She's alert and oriented x4 and appropriate throughout the examination. Cardiopulmonary assessment is negative for acute distress and she exhibits normal effort. Bilateral lumpectomy incision sites are well healed without erythema, separation, or drainage. Induration deep to the incision sites is present without fluctuance.    ECOG = 0  0 - Asymptomatic (Fully active, able to carry on all predisease activities without restriction)  1 - Symptomatic but completely ambulatory (Restricted in physically strenuous activity but ambulatory and able to carry out work of a light or sedentary nature. For example, light housework, office work)  2 - Symptomatic, <50% in bed during the day (Ambulatory and capable of all self care but unable to carry out any work activities. Up and about more than 50%  of waking hours)  3 - Symptomatic, >50% in bed, but not bedbound (Capable of only limited self-care, confined to bed or chair 50% or more of waking hours)  4 - Bedbound (Completely disabled. Cannot carry on any self-care. Totally confined to bed or chair)  5 - Death   Santiago Glad MM, Creech RH, Tormey DC, et al. [phone]). "Toxicity and response criteria of the [facility]". Am. Evlyn Clines. Oncol. 5 (6): 649-55    LABORATORY DATA:  Lab Results  Component Value Date   WBC 5.9 03/17/2023   HGB 11.5 (L) 03/17/2023   HCT 35.6 (L) 03/17/2023   MCV 93.0 03/17/2023   PLT 322 03/17/2023   Lab Results  Component Value Date   NA 139 04/20/2023   K 4.2 04/20/2023   CL 108 04/20/2023   CO2 23 04/20/2023   Lab Results  Component Value Date   ALT 16 03/17/2023   AST 22 03/17/2023   ALKPHOS 90 03/17/2023   BILITOT 0.6 03/17/2023      RADIOGRAPHY: No results found.     IMPRESSION/PLAN: 1.  Intermediate grade, ER/PR negative DCIS of the left breast. Dr. Mitzi Hansen has reviewed the final pathology findings and today the patient and I reviewed the nature of early stage breast cancer. She has done well since surgery. Fortunately the right breast findings were benign, and her surgery for the left breast is also complete.  Dr. Mitzi Hansen recommends external beam radiotherapy to the breast following her left lumpectomy to reduce risks of local recurrence. We discussed the risks, benefits, short, and long term effects of radiotherapy, as well as the curative intent, and the patient is interested in proceeding. I reviewed  the delivery and logistics of radiotherapy and that Dr. Mitzi Hansen recommends 4 weeks of radiotherapy to the left breast with deep inspiration breath hold technique. Written consent is obtained and placed in the chart, a copy was provided to the patient. She will simulate today.  In a visit lasting 45 minutes, greater than 50% of the time was spent face to face discussing the  patient's condition, in preparation for the discussion, and coordinating the patient's care.     Osker Mason, [facility]     **Disclaimer: This note was dictated with voice recognition software. Similar sounding words can inadvertently be transcribed and this note may contain transcription errors which may  not have been corrected upon publication of note.**

## 2023-05-25 NOTE — Progress Notes (Signed)
Nursing interview for Ductal carcinoma in situ (DCIS) of left breast. Patient identity verified x2.  Patient reports doing well. No  issues conveyed at this time.  Meaningful use complete.   Vitals- BP 111/74 (BP Location: Left Arm, Patient Position: Sitting, Cuff Size: Normal)   Pulse 75   Temp (!) 97.5 F (36.4 C) (Temporal)   Resp 18   Ht 5\' 6"  (1.676 m)   Wt 119 lb (54 kg)   SpO2 100%   BMI 19.21 kg/m    This concludes the interaction.  Ruel Favors, LPN

## 2023-05-27 DIAGNOSIS — Z510 Encounter for antineoplastic radiation therapy: Secondary | ICD-10-CM | POA: Diagnosis not present

## 2023-06-01 ENCOUNTER — Ambulatory Visit: Admission: RE | Admit: 2023-06-01 | Payer: Medicare Other | Source: Ambulatory Visit | Admitting: Radiation Oncology

## 2023-06-01 ENCOUNTER — Other Ambulatory Visit: Payer: Self-pay

## 2023-06-01 ENCOUNTER — Encounter: Payer: Self-pay | Admitting: *Deleted

## 2023-06-01 DIAGNOSIS — D0512 Intraductal carcinoma in situ of left breast: Secondary | ICD-10-CM

## 2023-06-01 DIAGNOSIS — Z510 Encounter for antineoplastic radiation therapy: Secondary | ICD-10-CM | POA: Diagnosis not present

## 2023-06-01 LAB — RAD ONC ARIA SESSION SUMMARY
Course Elapsed Days: 0
Plan Fractions Treated to Date: 1
Plan Prescribed Dose Per Fraction: 2.66 Gy
Plan Total Fractions Prescribed: 16
Plan Total Prescribed Dose: 42.56 Gy
Reference Point Dosage Given to Date: 2.66 Gy
Reference Point Session Dosage Given: 2.66 Gy
Session Number: 1

## 2023-06-02 ENCOUNTER — Other Ambulatory Visit: Payer: Self-pay

## 2023-06-02 ENCOUNTER — Ambulatory Visit
Admission: RE | Admit: 2023-06-02 | Discharge: 2023-06-02 | Disposition: A | Discharge status: Home / Self Care | Payer: Medicare Other | Source: Ambulatory Visit | Attending: Radiation Oncology | Admitting: Radiation Oncology

## 2023-06-02 ENCOUNTER — Inpatient Hospital Stay: Payer: Medicare Other | Attending: Hematology and Oncology

## 2023-06-02 DIAGNOSIS — Z510 Encounter for antineoplastic radiation therapy: Secondary | ICD-10-CM | POA: Diagnosis not present

## 2023-06-02 LAB — RAD ONC ARIA SESSION SUMMARY
Course Elapsed Days: 1
Plan Fractions Treated to Date: 2
Plan Prescribed Dose Per Fraction: 2.66 Gy
Plan Total Fractions Prescribed: 16
Plan Total Prescribed Dose: 42.56 Gy
Reference Point Dosage Given to Date: 5.32 Gy
Reference Point Session Dosage Given: 2.66 Gy
Session Number: 2

## 2023-06-02 NOTE — Progress Notes (Signed)
[facility] Clinical Social Work  Initial Assessment   Amanda Shepherd is a 72 y.o. year old female contacted by phone. Clinical Social Work was referred by self for assessment of psychosocial needs.   SDOH (Social Determinants of Health) assessments performed: No SDOH Interventions    Flowsheet Row Clinical Support from 01/07/2023 in [facility]  SDOH Interventions   Food Insecurity Interventions Intervention Not Indicated  Housing Interventions Intervention Not Indicated  Transportation Interventions Intervention Not Indicated  Utilities Interventions Intervention Not Indicated  Alcohol Usage Interventions Intervention Not Indicated (Score <7)  Financial Strain Interventions Intervention Not Indicated  Physical Activity Interventions Intervention Not Indicated  Stress Interventions Intervention Not Indicated  Social Connections Interventions Intervention Not Indicated       SDOH Screenings   Food Insecurity: Food Insecurity Present (05/25/2023)  Housing: Low Risk  (05/25/2023)  Transportation Needs: No Transportation Needs (05/25/2023)  Utilities: Not At Risk (05/25/2023)  Alcohol Screen: Low Risk  (01/07/2023)  Depression (PHQ2-9): Low Risk  (02/04/2023)  Financial Resource Strain: Low Risk  (01/07/2023)  Physical Activity: Inactive (01/07/2023)  Social Connections: Moderately Isolated (01/07/2023)  Stress: No Stress Concern Present (01/07/2023)  Tobacco Use: Low Risk  (05/25/2023)    Family/Social Information:  Housing Arrangement: patient lives with her sister.  Patient has resided with her sister for approx ten years.  Family members/support persons in your life? Family, Friends, Church, and [employer] concerns: no, patient will  be driving herself to radiation treatment.   Employment: Retired Patient stated that she has had many jobs, including working at [employer] and servings as a Optician, dispensing.  Income source:  Unknown Source Financial concerns:  Patient denied  any current SDOH financial needs, any current concerns with past due bills. However, patient stated that she takes care of her family financially.  Type of concern: Utilities and Rent/ mortgage Food access concerns: no Religious or spiritual practice: Yes-  Services Currently in place:  Hobbies, Insurance, Transportation  Coping/ Adjustment to diagnosis: Patient understands treatment plan and what happens next? yes Concerns about diagnosis and/or treatment: I'm not especially worried about anything Patient reported stressors:  financial concerns Hopes and/or priorities: Educational psychologist  Patient enjoys time with family/ friends Current coping skills/ strengths: Ability for insight , Active sense of humor , Average or above average intelligence , Capable of independent living , Manufacturing systems engineer , General fund of knowledge , Religious Affiliation , Special hobby/interest , and Supportive family/friends     SUMMARY: Current SDOH Barriers:  None at this time  Clinical Social Work Clinical Goal(s):  No clinical social work goals at this time  Interventions: Discussed common feeling and emotions when being diagnosed with cancer, and the importance of support during treatment Informed patient of the support team roles and support services at [facility] Provided CSW contact information and encouraged patient to call with any questions or concerns CSW received verbal authorization to send over financial assistance options via email address on file.  CSW sent referral to Patient Financial Resource specialist.    Follow Up Plan: Patient will contact CSW with any support or resource needs Patient verbalizes understanding of plan: Yes  Marguerita Merles, LCSWA Clinical Social Worker [facility]

## 2023-06-03 ENCOUNTER — Ambulatory Visit
Admission: RE | Admit: 2023-06-03 | Discharge: 2023-06-03 | Disposition: A | Discharge status: Home / Self Care | Payer: Medicare Other | Source: Ambulatory Visit | Attending: Radiation Oncology | Admitting: Radiation Oncology

## 2023-06-03 ENCOUNTER — Other Ambulatory Visit: Payer: Self-pay

## 2023-06-03 DIAGNOSIS — Z510 Encounter for antineoplastic radiation therapy: Secondary | ICD-10-CM | POA: Diagnosis not present

## 2023-06-03 LAB — RAD ONC ARIA SESSION SUMMARY
Course Elapsed Days: 2
Plan Fractions Treated to Date: 3
Plan Prescribed Dose Per Fraction: 2.66 Gy
Plan Total Fractions Prescribed: 16
Plan Total Prescribed Dose: 42.56 Gy
Reference Point Dosage Given to Date: 7.98 Gy
Reference Point Session Dosage Given: 2.66 Gy
Session Number: 3

## 2023-06-04 ENCOUNTER — Ambulatory Visit: Payer: Medicare Other

## 2023-06-04 ENCOUNTER — Ambulatory Visit
Admission: RE | Admit: 2023-06-04 | Discharge: 2023-06-04 | Disposition: A | Discharge status: Home / Self Care | Payer: Medicare Other | Source: Ambulatory Visit | Attending: Radiation Oncology | Admitting: Radiation Oncology

## 2023-06-04 ENCOUNTER — Other Ambulatory Visit: Payer: Self-pay

## 2023-06-04 ENCOUNTER — Inpatient Hospital Stay: Payer: Medicare Other

## 2023-06-04 DIAGNOSIS — Z510 Encounter for antineoplastic radiation therapy: Secondary | ICD-10-CM | POA: Diagnosis not present

## 2023-06-04 DIAGNOSIS — D0512 Intraductal carcinoma in situ of left breast: Secondary | ICD-10-CM

## 2023-06-04 LAB — RAD ONC ARIA SESSION SUMMARY
Course Elapsed Days: 3
Plan Fractions Treated to Date: 4
Plan Prescribed Dose Per Fraction: 2.66 Gy
Plan Total Fractions Prescribed: 16
Plan Total Prescribed Dose: 42.56 Gy
Reference Point Dosage Given to Date: 10.64 Gy
Reference Point Session Dosage Given: 2.66 Gy
Session Number: 4

## 2023-06-04 MED ORDER — ALRA NON-METALLIC DEODORANT (RAD-ONC)
1.0000 | Freq: Once | TOPICAL | Status: AC
  Administered 2023-06-04: 1 via TOPICAL

## 2023-06-04 MED ORDER — RADIAPLEXRX EX GEL: Freq: Once | CUTANEOUS | Status: AC

## 2023-06-04 NOTE — Progress Notes (Signed)
[facility] CSW Progress Note  Patient stopped by [employer] to provide Child psychotherapist with updated email address. Patient inquired about financial assistance, CSW provided patient Amanda Shepherd's card to discuss if the [employer] is a possibility. Patient will come back to office on 8/19 to apply for the [facility].    Marguerita Merles, LCSWA Clinical Social Worker [facility]

## 2023-06-07 ENCOUNTER — Encounter: Payer: Self-pay | Admitting: Radiation Oncology

## 2023-06-07 ENCOUNTER — Other Ambulatory Visit: Payer: Self-pay

## 2023-06-07 ENCOUNTER — Ambulatory Visit
Admission: RE | Admit: 2023-06-07 | Discharge: 2023-06-07 | Disposition: A | Discharge status: Home / Self Care | Payer: Medicare Other | Source: Ambulatory Visit | Attending: Radiation Oncology | Admitting: Radiation Oncology

## 2023-06-07 DIAGNOSIS — Z510 Encounter for antineoplastic radiation therapy: Secondary | ICD-10-CM | POA: Diagnosis not present

## 2023-06-07 LAB — RAD ONC ARIA SESSION SUMMARY
Course Elapsed Days: 6
Plan Fractions Treated to Date: 5
Plan Prescribed Dose Per Fraction: 2.66 Gy
Plan Total Fractions Prescribed: 16
Plan Total Prescribed Dose: 42.56 Gy
Reference Point Dosage Given to Date: 13.3 Gy
Reference Point Session Dosage Given: 2.66 Gy
Session Number: 5

## 2023-06-07 NOTE — Progress Notes (Signed)
Returned call to patient from voicemail left regarding applying for [employer].  Left my contact name and number for patient to return my call.

## 2023-06-07 NOTE — Progress Notes (Signed)
Patient returned my call. Advised income guidelines and what is needed to apply for the grant. She will look for information and call me back.

## 2023-06-07 NOTE — Progress Notes (Signed)
Patient returned my call and advised she exceeds the income guidelines for the [employer].   Provided her number for [employer] if copay assistance is available.  She has my card for any additional financial questions or concerns.

## 2023-06-08 ENCOUNTER — Inpatient Hospital Stay: Payer: Medicare Other

## 2023-06-08 ENCOUNTER — Ambulatory Visit: Admission: RE | Admit: 2023-06-08 | Payer: Medicare Other | Source: Ambulatory Visit

## 2023-06-08 ENCOUNTER — Other Ambulatory Visit: Payer: Self-pay

## 2023-06-08 DIAGNOSIS — Z510 Encounter for antineoplastic radiation therapy: Secondary | ICD-10-CM | POA: Diagnosis not present

## 2023-06-08 LAB — RAD ONC ARIA SESSION SUMMARY
Course Elapsed Days: 7
Plan Fractions Treated to Date: 6
Plan Prescribed Dose Per Fraction: 2.66 Gy
Plan Total Fractions Prescribed: 16
Plan Total Prescribed Dose: 42.56 Gy
Reference Point Dosage Given to Date: 15.96 Gy
Reference Point Session Dosage Given: 2.66 Gy
Session Number: 6

## 2023-06-08 NOTE — Progress Notes (Signed)
[facility] CSW Progress Note  Clinical Child psychotherapist contacted patient by phone to discuss [employer]. CSW submitted application on patient's behalf and verified information in the application. CSW was given verbal authorization to send application confirmation to [email or web address].  CSW was given direct information. No follow up needed at this time.  Marguerita Merles, LCSWA Clinical Social Worker [facility]

## 2023-06-09 ENCOUNTER — Other Ambulatory Visit: Payer: Self-pay

## 2023-06-09 ENCOUNTER — Ambulatory Visit
Admission: RE | Admit: 2023-06-09 | Discharge: 2023-06-09 | Disposition: A | Discharge status: Home / Self Care | Payer: Medicare Other | Source: Ambulatory Visit | Attending: Radiation Oncology | Admitting: Radiation Oncology

## 2023-06-09 DIAGNOSIS — Z510 Encounter for antineoplastic radiation therapy: Secondary | ICD-10-CM | POA: Diagnosis not present

## 2023-06-09 LAB — RAD ONC ARIA SESSION SUMMARY
Course Elapsed Days: 8
Plan Fractions Treated to Date: 7
Plan Prescribed Dose Per Fraction: 2.66 Gy
Plan Total Fractions Prescribed: 16
Plan Total Prescribed Dose: 42.56 Gy
Reference Point Dosage Given to Date: 18.62 Gy
Reference Point Session Dosage Given: 2.66 Gy
Session Number: 7

## 2023-06-10 ENCOUNTER — Other Ambulatory Visit: Payer: Self-pay

## 2023-06-10 ENCOUNTER — Ambulatory Visit
Admission: RE | Admit: 2023-06-10 | Discharge: 2023-06-10 | Disposition: A | Discharge status: Home / Self Care | Payer: Medicare Other | Source: Ambulatory Visit | Attending: Radiation Oncology | Admitting: Radiation Oncology

## 2023-06-10 DIAGNOSIS — Z510 Encounter for antineoplastic radiation therapy: Secondary | ICD-10-CM | POA: Diagnosis not present

## 2023-06-10 LAB — RAD ONC ARIA SESSION SUMMARY
Course Elapsed Days: 9
Plan Fractions Treated to Date: 8
Plan Prescribed Dose Per Fraction: 2.66 Gy
Plan Total Fractions Prescribed: 16
Plan Total Prescribed Dose: 42.56 Gy
Reference Point Dosage Given to Date: 21.28 Gy
Reference Point Session Dosage Given: 2.66 Gy
Session Number: 8

## 2023-06-11 ENCOUNTER — Other Ambulatory Visit: Payer: Self-pay

## 2023-06-11 ENCOUNTER — Ambulatory Visit
Admission: RE | Admit: 2023-06-11 | Discharge: 2023-06-11 | Disposition: A | Discharge status: Home / Self Care | Payer: Medicare Other | Source: Ambulatory Visit | Attending: Radiation Oncology

## 2023-06-11 ENCOUNTER — Ambulatory Visit: Admission: RE | Admit: 2023-06-11 | Payer: Medicare Other | Source: Ambulatory Visit

## 2023-06-11 DIAGNOSIS — Z510 Encounter for antineoplastic radiation therapy: Secondary | ICD-10-CM | POA: Diagnosis not present

## 2023-06-11 LAB — RAD ONC ARIA SESSION SUMMARY
Course Elapsed Days: 10
Plan Fractions Treated to Date: 9
Plan Prescribed Dose Per Fraction: 2.66 Gy
Plan Total Fractions Prescribed: 16
Plan Total Prescribed Dose: 42.56 Gy
Reference Point Dosage Given to Date: 23.94 Gy
Reference Point Session Dosage Given: 2.66 Gy
Session Number: 9

## 2023-06-14 ENCOUNTER — Ambulatory Visit
Admission: RE | Admit: 2023-06-14 | Discharge: 2023-06-14 | Disposition: A | Discharge status: Home / Self Care | Payer: Medicare Other | Source: Ambulatory Visit | Attending: Radiation Oncology | Admitting: Radiation Oncology

## 2023-06-14 ENCOUNTER — Other Ambulatory Visit: Payer: Self-pay

## 2023-06-14 DIAGNOSIS — Z510 Encounter for antineoplastic radiation therapy: Secondary | ICD-10-CM | POA: Diagnosis not present

## 2023-06-14 LAB — RAD ONC ARIA SESSION SUMMARY
Course Elapsed Days: 13
Plan Fractions Treated to Date: 10
Plan Prescribed Dose Per Fraction: 2.66 Gy
Plan Total Fractions Prescribed: 16
Plan Total Prescribed Dose: 42.56 Gy
Reference Point Dosage Given to Date: 26.6 Gy
Reference Point Session Dosage Given: 2.66 Gy
Session Number: 10

## 2023-06-15 ENCOUNTER — Other Ambulatory Visit: Payer: Self-pay

## 2023-06-15 ENCOUNTER — Ambulatory Visit: Admission: RE | Admit: 2023-06-15 | Payer: Medicare Other | Source: Ambulatory Visit

## 2023-06-15 DIAGNOSIS — Z510 Encounter for antineoplastic radiation therapy: Secondary | ICD-10-CM | POA: Diagnosis not present

## 2023-06-15 LAB — RAD ONC ARIA SESSION SUMMARY
Course Elapsed Days: 14
Plan Fractions Treated to Date: 11
Plan Prescribed Dose Per Fraction: 2.66 Gy
Plan Total Fractions Prescribed: 16
Plan Total Prescribed Dose: 42.56 Gy
Reference Point Dosage Given to Date: 29.26 Gy
Reference Point Session Dosage Given: 2.66 Gy
Session Number: 11

## 2023-06-16 ENCOUNTER — Other Ambulatory Visit: Payer: Self-pay

## 2023-06-16 ENCOUNTER — Other Ambulatory Visit (HOSPITAL_COMMUNITY): Payer: Self-pay

## 2023-06-16 ENCOUNTER — Ambulatory Visit
Admission: RE | Admit: 2023-06-16 | Discharge: 2023-06-16 | Disposition: A | Discharge status: Home / Self Care | Payer: Medicare Other | Source: Ambulatory Visit | Attending: Radiation Oncology | Admitting: Radiation Oncology

## 2023-06-16 DIAGNOSIS — Z510 Encounter for antineoplastic radiation therapy: Secondary | ICD-10-CM | POA: Diagnosis not present

## 2023-06-16 LAB — RAD ONC ARIA SESSION SUMMARY
Course Elapsed Days: 15
Plan Fractions Treated to Date: 12
Plan Prescribed Dose Per Fraction: 2.66 Gy
Plan Total Fractions Prescribed: 16
Plan Total Prescribed Dose: 42.56 Gy
Reference Point Dosage Given to Date: 31.92 Gy
Reference Point Session Dosage Given: 2.66 Gy
Session Number: 12

## 2023-06-17 ENCOUNTER — Ambulatory Visit
Admission: RE | Admit: 2023-06-17 | Discharge: 2023-06-17 | Disposition: A | Discharge status: Home / Self Care | Payer: Medicare Other | Source: Ambulatory Visit | Attending: Radiation Oncology | Admitting: Radiation Oncology

## 2023-06-17 ENCOUNTER — Other Ambulatory Visit (HOSPITAL_COMMUNITY): Payer: Self-pay

## 2023-06-17 ENCOUNTER — Other Ambulatory Visit: Payer: Self-pay

## 2023-06-17 DIAGNOSIS — Z510 Encounter for antineoplastic radiation therapy: Secondary | ICD-10-CM | POA: Diagnosis not present

## 2023-06-17 LAB — RAD ONC ARIA SESSION SUMMARY
Course Elapsed Days: 16
Plan Fractions Treated to Date: 13
Plan Prescribed Dose Per Fraction: 2.66 Gy
Plan Total Fractions Prescribed: 16
Plan Total Prescribed Dose: 42.56 Gy
Reference Point Dosage Given to Date: 34.58 Gy
Reference Point Session Dosage Given: 2.66 Gy
Session Number: 13

## 2023-06-18 ENCOUNTER — Ambulatory Visit
Admission: RE | Admit: 2023-06-18 | Discharge: 2023-06-18 | Disposition: A | Discharge status: Home / Self Care | Payer: Medicare Other | Source: Ambulatory Visit | Attending: Radiation Oncology | Admitting: Radiation Oncology

## 2023-06-18 ENCOUNTER — Other Ambulatory Visit: Payer: Self-pay

## 2023-06-18 ENCOUNTER — Ambulatory Visit: Payer: Medicare Other | Admitting: Radiation Oncology

## 2023-06-18 DIAGNOSIS — Z510 Encounter for antineoplastic radiation therapy: Secondary | ICD-10-CM | POA: Diagnosis not present

## 2023-06-18 LAB — RAD ONC ARIA SESSION SUMMARY
Course Elapsed Days: 17
Plan Fractions Treated to Date: 14
Plan Prescribed Dose Per Fraction: 2.66 Gy
Plan Total Fractions Prescribed: 16
Plan Total Prescribed Dose: 42.56 Gy
Reference Point Dosage Given to Date: 37.24 Gy
Reference Point Session Dosage Given: 2.66 Gy
Session Number: 14

## 2023-06-22 ENCOUNTER — Ambulatory Visit
Admission: RE | Admit: 2023-06-22 | Discharge: 2023-06-22 | Disposition: A | Discharge status: Home / Self Care | Payer: Medicare Other | Source: Ambulatory Visit | Attending: Radiation Oncology | Admitting: Radiation Oncology

## 2023-06-22 ENCOUNTER — Other Ambulatory Visit: Payer: Self-pay

## 2023-06-22 DIAGNOSIS — D0512 Intraductal carcinoma in situ of left breast: Secondary | ICD-10-CM | POA: Insufficient documentation

## 2023-06-22 LAB — RAD ONC ARIA SESSION SUMMARY
Course Elapsed Days: 21
Plan Fractions Treated to Date: 15
Plan Prescribed Dose Per Fraction: 2.66 Gy
Plan Total Fractions Prescribed: 16
Plan Total Prescribed Dose: 42.56 Gy
Reference Point Dosage Given to Date: 39.9 Gy
Reference Point Session Dosage Given: 2.66 Gy
Session Number: 15

## 2023-06-23 ENCOUNTER — Ambulatory Visit
Admission: RE | Admit: 2023-06-23 | Discharge: 2023-06-23 | Disposition: A | Discharge status: Home / Self Care | Payer: Medicare Other | Source: Ambulatory Visit | Attending: Radiation Oncology | Admitting: Radiation Oncology

## 2023-06-23 ENCOUNTER — Other Ambulatory Visit: Payer: Self-pay

## 2023-06-23 DIAGNOSIS — D0512 Intraductal carcinoma in situ of left breast: Secondary | ICD-10-CM | POA: Diagnosis not present

## 2023-06-23 LAB — RAD ONC ARIA SESSION SUMMARY
Course Elapsed Days: 22
Plan Fractions Treated to Date: 16
Plan Prescribed Dose Per Fraction: 2.66 Gy
Plan Total Fractions Prescribed: 16
Plan Total Prescribed Dose: 42.56 Gy
Reference Point Dosage Given to Date: 42.56 Gy
Reference Point Session Dosage Given: 2.66 Gy
Session Number: 16

## 2023-06-24 ENCOUNTER — Ambulatory Visit
Admission: RE | Admit: 2023-06-24 | Discharge: 2023-06-24 | Disposition: A | Discharge status: Home / Self Care | Payer: Medicare Other | Source: Ambulatory Visit | Attending: Radiation Oncology | Admitting: Radiation Oncology

## 2023-06-24 ENCOUNTER — Other Ambulatory Visit: Payer: Self-pay

## 2023-06-24 DIAGNOSIS — D0512 Intraductal carcinoma in situ of left breast: Secondary | ICD-10-CM | POA: Diagnosis not present

## 2023-06-24 LAB — RAD ONC ARIA SESSION SUMMARY
Course Elapsed Days: 23
Plan Fractions Treated to Date: 1
Plan Prescribed Dose Per Fraction: 2 Gy
Plan Total Fractions Prescribed: 4
Plan Total Prescribed Dose: 8 Gy
Reference Point Dosage Given to Date: 2 Gy
Reference Point Session Dosage Given: 2 Gy
Session Number: 17

## 2023-06-25 ENCOUNTER — Ambulatory Visit
Admission: RE | Admit: 2023-06-25 | Discharge: 2023-06-25 | Disposition: A | Discharge status: Home / Self Care | Payer: Medicare Other | Source: Ambulatory Visit | Attending: Radiation Oncology

## 2023-06-25 ENCOUNTER — Other Ambulatory Visit: Payer: Self-pay

## 2023-06-25 DIAGNOSIS — D0512 Intraductal carcinoma in situ of left breast: Secondary | ICD-10-CM

## 2023-06-25 LAB — RAD ONC ARIA SESSION SUMMARY
Course Elapsed Days: 24
Plan Fractions Treated to Date: 2
Plan Prescribed Dose Per Fraction: 2 Gy
Plan Total Fractions Prescribed: 4
Plan Total Prescribed Dose: 8 Gy
Reference Point Dosage Given to Date: 4 Gy
Reference Point Session Dosage Given: 2 Gy
Session Number: 18

## 2023-06-25 MED ORDER — RADIAPLEXRX EX GEL: Freq: Once | CUTANEOUS | Status: AC

## 2023-06-25 MED ORDER — ALRA NON-METALLIC DEODORANT (RAD-ONC)
1.0000 | Freq: Once | TOPICAL | Status: AC
  Administered 2023-06-25: 1 via TOPICAL

## 2023-06-28 ENCOUNTER — Other Ambulatory Visit: Payer: Self-pay

## 2023-06-28 ENCOUNTER — Ambulatory Visit
Admission: RE | Admit: 2023-06-28 | Discharge: 2023-06-28 | Disposition: A | Discharge status: Home / Self Care | Payer: Medicare Other | Source: Ambulatory Visit | Attending: Radiation Oncology | Admitting: Radiation Oncology

## 2023-06-28 DIAGNOSIS — D0512 Intraductal carcinoma in situ of left breast: Secondary | ICD-10-CM | POA: Diagnosis not present

## 2023-06-28 LAB — RAD ONC ARIA SESSION SUMMARY
Course Elapsed Days: 27
Plan Fractions Treated to Date: 3
Plan Prescribed Dose Per Fraction: 2 Gy
Plan Total Fractions Prescribed: 4
Plan Total Prescribed Dose: 8 Gy
Reference Point Dosage Given to Date: 6 Gy
Reference Point Session Dosage Given: 2 Gy
Session Number: 19

## 2023-06-29 ENCOUNTER — Other Ambulatory Visit: Payer: Self-pay

## 2023-06-29 ENCOUNTER — Ambulatory Visit
Admission: RE | Admit: 2023-06-29 | Discharge: 2023-06-29 | Disposition: A | Discharge status: Home / Self Care | Payer: Medicare Other | Source: Ambulatory Visit | Attending: Radiation Oncology | Admitting: Radiation Oncology

## 2023-06-29 DIAGNOSIS — D0512 Intraductal carcinoma in situ of left breast: Secondary | ICD-10-CM | POA: Diagnosis not present

## 2023-06-29 LAB — RAD ONC ARIA SESSION SUMMARY
Course Elapsed Days: 28
Plan Fractions Treated to Date: 4
Plan Prescribed Dose Per Fraction: 2 Gy
Plan Total Fractions Prescribed: 4
Plan Total Prescribed Dose: 8 Gy
Reference Point Dosage Given to Date: 8 Gy
Reference Point Session Dosage Given: 2 Gy
Session Number: 20

## 2023-06-30 ENCOUNTER — Other Ambulatory Visit (HOSPITAL_COMMUNITY): Payer: Self-pay

## 2023-07-01 NOTE — Radiation Completion Notes (Addendum)
Radiation Oncology         [phone]) [phone] ________________________________  Name: Amanda Shepherd MRN: [id number]  Date of Service: 06/29/2023  DOB: 06/13/1951  End of Treatment Note  Diagnosis:  Intermediate grade, ER/PR negative DCIS of the left breast.   Intent: Curative     ==========DELIVERED PLANS==========  First Treatment Date: 2023-06-01 - Last Treatment Date: 2023-06-29   Plan Name: Breast_L_BH Site: Breast, Left Technique: 3D Mode: Photon Dose Per Fraction: 2.66 Gy Prescribed Dose (Delivered / Prescribed): 42.56 Gy / 42.56 Gy Prescribed Fxs (Delivered / Prescribed): 16 / 16   Plan Name: Brst_L_Bst_BH Site: Breast, Left Technique: 3D Mode: Photon Dose Per Fraction: 2 Gy Prescribed Dose (Delivered / Prescribed): 8 Gy / 8 Gy Prescribed Fxs (Delivered / Prescribed): 4 / 4     ==========ON TREATMENT VISIT DATES========== 2023-06-04, 2023-06-11, 2023-06-18, 2023-06-25   See weekly On Treatment Notes in Epic for details. The patient tolerated radiation. She developed anticipated skin changes in the treatment field.   The patient will receive a call in about one month from the radiation oncology department. She will continue follow up with Dr. Pamelia Hoit as well.      Osker Mason, PAC

## 2023-07-08 ENCOUNTER — Other Ambulatory Visit: Payer: Self-pay

## 2023-07-08 ENCOUNTER — Ambulatory Visit (INDEPENDENT_AMBULATORY_CARE_PROVIDER_SITE_OTHER): Payer: Medicare Other | Admitting: Family Medicine

## 2023-07-08 ENCOUNTER — Other Ambulatory Visit (HOSPITAL_COMMUNITY): Payer: Self-pay

## 2023-07-08 ENCOUNTER — Encounter: Payer: Self-pay | Admitting: Family Medicine

## 2023-07-08 VITALS — BP 147/85 | HR 87 | Ht 66.0 in | Wt 122.4 lb

## 2023-07-08 DIAGNOSIS — E785 Hyperlipidemia, unspecified: Secondary | ICD-10-CM

## 2023-07-08 DIAGNOSIS — Z780 Asymptomatic menopausal state: Secondary | ICD-10-CM

## 2023-07-08 DIAGNOSIS — E1169 Type 2 diabetes mellitus with other specified complication: Secondary | ICD-10-CM

## 2023-07-08 DIAGNOSIS — Z23 Encounter for immunization: Secondary | ICD-10-CM

## 2023-07-08 DIAGNOSIS — Z13820 Encounter for screening for osteoporosis: Secondary | ICD-10-CM

## 2023-07-08 DIAGNOSIS — I10 Essential (primary) hypertension: Secondary | ICD-10-CM | POA: Diagnosis not present

## 2023-07-08 LAB — POCT GLYCOSYLATED HEMOGLOBIN (HGB A1C): HbA1c, POC (controlled diabetic range): 8.6 % — AB (ref 0.0–7.0)

## 2023-07-08 MED ORDER — ROSUVASTATIN CALCIUM 20 MG PO TABS
20.0000 mg | ORAL_TABLET | Freq: Every day | ORAL | 3 refills | Status: DC
Start: 2023-07-08 — End: 2023-07-08
  Filled 2023-07-08: qty 90, 90d supply, fill #0

## 2023-07-08 MED ORDER — LOSARTAN POTASSIUM 25 MG PO TABS
25.0000 mg | ORAL_TABLET | Freq: Every day | ORAL | 0 refills | Status: DC
Start: 2023-07-08 — End: 2023-09-08
  Filled 2023-07-08: qty 90, 90d supply, fill #0

## 2023-07-08 MED ORDER — METFORMIN HCL ER 500 MG PO TB24
1000.0000 mg | ORAL_TABLET | Freq: Two times a day (BID) | ORAL | 0 refills | Status: DC
Start: 2023-07-08 — End: 2023-10-08
  Filled 2023-07-08: qty 180, 45d supply, fill #0

## 2023-07-08 MED ORDER — ROSUVASTATIN CALCIUM 20 MG PO TABS
20.0000 mg | ORAL_TABLET | Freq: Every day | ORAL | 3 refills | Status: DC
Start: 2023-07-08 — End: 2023-09-08
  Filled 2023-07-08 – 2023-07-13 (×2): qty 60, 60d supply, fill #0

## 2023-07-08 MED ORDER — TRULICITY 3 MG/0.5ML ~~LOC~~ SOAJ
3.0000 mg | SUBCUTANEOUS | 1 refills | Status: DC
  Filled 2023-07-08: qty 2, 28d supply, fill #0
  Filled 2023-07-13 (×2): qty 6, 84d supply, fill #0
  Filled 2023-07-22: qty 2, 28d supply, fill #0

## 2023-07-08 MED ORDER — ALENDRONATE SODIUM 70 MG PO TABS
ORAL_TABLET | ORAL | 0 refills | Status: DC
Start: 2023-07-08 — End: 2023-09-08
  Filled 2023-07-08: qty 4, 28d supply, fill #0

## 2023-07-08 NOTE — Patient Instructions (Signed)
It was wonderful to see you today.  Please bring ALL of your medications with you to every visit.   Today we talked about:  Diabetes - Let's work on soda consumption and [facility] and increasing metformin to 1000 twice a day. Next time we will follow up. If the A1c is still elevated we will increase the Trulicity.   Please take your losartan for blood pressure control. This will also help your kidneys.   Please call to have a colonoscopy and eye doctor appointment   Please follow up in 1-2 months   Thank you for choosing [facility].   Please call [phone] with any questions about today's appointment.  Please be sure to schedule follow up at the front desk before you leave today.   Lockie Mola, MD  Family Medicine

## 2023-07-08 NOTE — Progress Notes (Cosign Needed Addendum)
Subjective:  CC -- Annual Physical Pt reports frustration regarding breast cancer diagnosis. Patient also feels like she has polypharmacy.  General Healthcare: Medication Compliance: yes, however does not have clear picture of her different medications this might have nonadherence with few of her medications such as Fosamax and metformin.  Dx Hypertension: yes  Dx Hyperlipidemia: yes  Diabetes: yes  Dx Obesity: no  Weight Loss: no stable however is receiving chemotherapy  Physical Activity: no, patient says that she has bad right knee however shared decision making [facility] Urinary Incontinence: no   Social: Driving: yes  Alcohol Use: no  Tobacco Use: no  Independence: Very independent Depression: no  Support and Life at Home: yes  Advanced Directives: no, gust advance directive especially as patient has cancer  Cancer: DCIS of left breast, stage 0 has gotten surgery and radiation Colorectal >> Colonoscopy: Performed in 2022 however did have multiple sessile polyps removed patient will call to have follow-up colonoscopy  Cervical/Endometrial >>  - Postmenopausal: yes - Hysterectomy: yes  - Vaginal Bleeding: no  Skin >> Suspicious lesions: no   Other: Osteoporosis: yes, Had dexa 05/19/22 showing T score -2.5 in lumbar spine and femoral neck. Fosamax and Oscal with D sent to her pharmacy.  However patient has not been taking this medication Zoster Vaccine: no patient declined Flu Vaccine: Had within this year COVID-vaccine received Pneumonia Vaccine: Yes  Past Medical History Patient Active Problem List   Diagnosis Date Noted   Ductal carcinoma in situ (DCIS) of left breast 03/16/2023   Seasonal allergies 02/04/2023   Osteoporosis 06/01/2022   CKD (chronic kidney disease), stage III (HCC) 03/03/2021   Genetic testing 07/08/2017   Mild non proliferative diabetic retinopathy (HCC) 06/04/2014   Primary hypertension 09/22/2010   Type 2 diabetes  mellitus (HCC) 02/14/2007   Hyperlipidemia associated with type 2 diabetes mellitus (HCC) 02/14/2007   Medications- reviewed and updated Current Outpatient Medications  Medication Sig Dispense Refill   azelastine (ASTELIN) 0.1 % nasal spray Place 1 spray into both nostrils 2 (two) times daily. Use in each nostril as directed (Patient taking differently: Place 1 spray into both nostrils 2 (two) times daily as needed for allergies. Use in each nostril as directed) 30 mL 0   Blood Glucose Monitoring Suppl (ONE TOUCH ULTRA 2) w/Device KIT USE TO CHECK GLUCOSE BEFORE MEAL(S) AND  AT BEDTIME 1 kit 0   calcium-vitamin D (OSCAL WITH D) 500-5 MG-MCG tablet Take 1 tablet by mouth daily with breakfast. 90 tablet 0   cetirizine (ZYRTEC) 10 MG tablet Take 1 tablet (10 mg total) by mouth daily as needed for allergies. 30 tablet 4   dapagliflozin propanediol (FARXIGA) 10 MG TABS tablet Take 1 tablet by mouth once daily 90 tablet 0   fluticasone (FLONASE) 50 MCG/ACT nasal spray Place 1 spray into both nostrils daily. 16 g 2   glucose blood (ONETOUCH ULTRA) test strip Use to check blood sugar up to 4 times daily 100 each 1   Lancets (ONETOUCH DELICA PLUS LANCET33G) MISC USE TO CHECK BLOOD SUGARS FOUR TIMES DAILY. 100 each 0   Multiple Vitamin (MULTIVITAMIN) tablet Take 1 tablet by mouth daily. Centrum Silver     alendronate (FOSAMAX) 70 MG tablet TAKE 1 TABLET BY MOUTH ONCE A WEEK WITH A FULL GLASS OF WATER ON AN EMPTY STOMACH. 4 tablet 0   Dulaglutide (TRULICITY) 3 MG/0.5ML SOPN Inject 3 mg into the skin once a week. 6 mL 1  losartan (COZAAR) 25 MG tablet Take 1 tablet (25 mg total) by mouth at bedtime. 90 tablet 0   metFORMIN (GLUCOPHAGE-XR) 500 MG 24 hr tablet Take 2 tablets (1,000 mg total) by mouth 2 (two) times daily with a meal. 180 tablet 0   rosuvastatin (CRESTOR) 20 MG tablet Take 1 tablet (20 mg total) by mouth daily. 90 tablet 3   No current facility-administered medications for this visit.    Objective: BP (!) 147/85   Pulse 87   Ht 5\' 6"  (1.676 m)   Wt 122 lb 6.4 oz (55.5 kg)   SpO2 100%   BMI 19.76 kg/m  Gen: NAD, alert, cooperative with exam HEENT: NCAT, EOMI, PERRL CV: RRR, good S1/S2, no murmur Resp: CTABL, no wheezes, non-labored Abd: Soft, Non Tender, Non Distended, BS  Ext: No edema, warm Neuro: Alert and oriented, No gross deficits  Assessment/Plan:  Assessment & Plan Type 2 diabetes mellitus with other specified complication, without long-term current use of insulin (HCC) A1c 8.9 from 8.6.  - emphasized importance of metformin and increased dose from 500 BID to 1000 BID.  - refilled Trulicity, continue farxiga  - counseled regarding intake of sodas and juices.  - Given patient had non adherence with medications due to stressors (cancer diagnosis) and misunderstanding importance of certain medications will only continue with the above changes and follow up.  Primary hypertension Not in control. Most likely due to non adherence with losartan.  - Refilled losartan and provided blood pressure monitoring table  - Will follow up patient's blood pressure home record, follow up in 1-2 months  Hyperlipidemia associated with type 2 diabetes mellitus (HCC) - lipid panel today  - continue rosuvastatin  Encounter for osteoporosis screening in asymptomatic postmenopausal patient - Continue alendronate, vitamin d and calcium  - Last dexa in 2023, can follow up in 3 years  Encounter for immunization - Covid vaccine    Orders Placed This Encounter  Procedures   Pfizer Comirnaty Covid-19 Vaccine 51yrs & older   Basic Metabolic Panel   Microalbumin/Creatinine Ratio, Urine   HgB A1c   Meds ordered this encounter  Medications   DISCONTD: rosuvastatin (CRESTOR) 20 MG tablet    Sig: Take 1 tablet (20 mg total) by mouth daily.    Dispense:  90 tablet    Refill:  3   losartan (COZAAR) 25 MG tablet    Sig: Take 1 tablet (25 mg total) by mouth at bedtime.     Dispense:  90 tablet    Refill:  0   alendronate (FOSAMAX) 70 MG tablet    Sig: TAKE 1 TABLET BY MOUTH ONCE A WEEK WITH A FULL GLASS OF WATER ON AN EMPTY STOMACH.    Dispense:  4 tablet    Refill:  0   metFORMIN (GLUCOPHAGE-XR) 500 MG 24 hr tablet    Sig: Take 2 tablets (1,000 mg total) by mouth 2 (two) times daily with a meal.    Dispense:  180 tablet    Refill:  0   rosuvastatin (CRESTOR) 20 MG tablet    Sig: Take 1 tablet (20 mg total) by mouth daily.    Dispense:  90 tablet    Refill:  3   Dulaglutide (TRULICITY) 3 MG/0.5ML SOPN    Sig: Inject 3 mg into the skin once a week.    Dispense:  6 mL    Refill:  1    E11.69    Bubba Hales, Medical Student  [facility]    I attest that I have reviewed the student note and that the components of the history of the present illness, the physical exam, and the assessment and plan documented were performed by me or were performed in my presence by the student where I verified the documentation and performed (or re-performed) the exam and medical decision making. I verify that the service and findings are accurately documented in the student's note.   Lockie Mola, MD                  07/12/2023, 8:15 AM

## 2023-07-09 ENCOUNTER — Other Ambulatory Visit: Payer: Self-pay

## 2023-07-09 LAB — BASIC METABOLIC PANEL
BUN/Creatinine Ratio: 15 (ref 12–28)
BUN: 17 mg/dL (ref 8–27)
CO2: 22 mmol/L (ref 20–29)
Calcium: 9.9 mg/dL (ref 8.7–10.3)
Chloride: 104 mmol/L (ref 96–106)
Creatinine, Ser: 1.15 mg/dL — ABNORMAL HIGH (ref 0.57–1.00)
Glucose: 103 mg/dL — ABNORMAL HIGH (ref 70–99)
Potassium: 4.2 mmol/L (ref 3.5–5.2)
Sodium: 141 mmol/L (ref 134–144)
eGFR: 51 mL/min/{1.73_m2} — ABNORMAL LOW (ref >59)

## 2023-07-09 LAB — MICROALBUMIN / CREATININE URINE RATIO
Creatinine, Urine: 60.5 mg/dL
Microalb/Creat Ratio: 29 mg/g creat (ref 0–29)
Microalbumin, Urine: 17.6 ug/mL

## 2023-07-12 NOTE — Assessment & Plan Note (Signed)
Not in control. Most likely due to non adherence with losartan.  - Refilled losartan and provided blood pressure monitoring table  - Will follow up patient's blood pressure home record, follow up in 1-2 months

## 2023-07-12 NOTE — Assessment & Plan Note (Signed)
-   lipid panel today  - continue rosuvastatin

## 2023-07-12 NOTE — Assessment & Plan Note (Signed)
A1c 8.9 from 8.6.  - emphasized importance of metformin and increased dose from 500 BID to 1000 BID.  - refilled Trulicity, continue farxiga  - counseled regarding intake of sodas and juices.  - Given patient had non adherence with medications due to stressors (cancer diagnosis) and misunderstanding importance of certain medications will only continue with the above changes and follow up.

## 2023-07-13 ENCOUNTER — Other Ambulatory Visit (HOSPITAL_COMMUNITY): Payer: Self-pay

## 2023-07-22 ENCOUNTER — Other Ambulatory Visit (HOSPITAL_COMMUNITY): Payer: Self-pay

## 2023-08-05 ENCOUNTER — Encounter: Payer: Self-pay | Admitting: Adult Health

## 2023-08-05 ENCOUNTER — Inpatient Hospital Stay: Payer: Medicare Other | Attending: Hematology and Oncology | Admitting: Adult Health

## 2023-08-05 VITALS — BP 139/74 | HR 79 | Temp 97.7°F | Resp 18 | Ht 66.0 in | Wt 126.5 lb

## 2023-08-05 DIAGNOSIS — Z86000 Personal history of in-situ neoplasm of breast: Secondary | ICD-10-CM | POA: Insufficient documentation

## 2023-08-05 DIAGNOSIS — Z08 Encounter for follow-up examination after completed treatment for malignant neoplasm: Secondary | ICD-10-CM | POA: Diagnosis present

## 2023-08-05 DIAGNOSIS — D0512 Intraductal carcinoma in situ of left breast: Secondary | ICD-10-CM

## 2023-08-05 NOTE — Progress Notes (Signed)
SURVIVORSHIP VISIT:  BRIEF ONCOLOGIC HISTORY:  Oncology History  Ductal carcinoma in situ (DCIS) of left breast  03/12/2023 Initial Diagnosis   Screening mammogram detected bilateral breast calcifications.  Left breast 1.5 cm biopsy: DCIS intermediate grade solid and cribriform type with necrosis ER 0%, PR 0%.  Right breast retroareolar calcifications 0.4 cm stereotactic biopsy was benign discordant rebiopsy intraductal papilloma   03/17/2023 Cancer Staging   Staging form: Breast, AJCC 8th Edition - Clinical stage from 03/17/2023: Stage 0 (cTis (DCIS), cN0, cM0, ER-, PR-, HER2: Not Assessed) - Signed by Serena Croissant, MD on 03/17/2023 Stage prefix: Initial diagnosis Nuclear grade: G2 Laterality: Left Staged by: Pathologist and managing physician National guidelines used in treatment planning: Yes Type of national guideline used in treatment planning: NCCN   03/24/2023 Genetic Testing   Negative [facility] Multi-Cancer + RNA Panel. VUS detected in APC c.4886A>G (p.His1629Arg) and TSC2 c.-30+1G>T (Splice donor). Report date is March 24, 2023.   The Multi-Cancer + RNA Panel offered by [facility] includes sequencing and/or deletion/duplication analysis of the following 70 genes:  AIP*, ALK, APC*, ATM*, AXIN2*, BAP1*, BARD1*, BLM*, BMPR1A*, BRCA1*, BRCA2*, BRIP1*, CDC73*, CDH1*, CDK4, CDKN1B*, CDKN2A, CHEK2*, CTNNA1*, DICER1*, EPCAM (del/dup only), EGFR, FH*, FLCN*, GREM1 (promoter dup only), HOXB13, KIT, LZTR1, MAX*, MBD4, MEN1*, MET, MITF, MLH1*, MSH2*, MSH3*, MSH6*, MUTYH*, NF1*, NF2*, NTHL1*, PALB2*, PDGFRA, PMS2*, POLD1*, POLE*, POT1*, PRKAR1A*, PTCH1*, PTEN*, RAD51C*, RAD51D*, RB1*, RET, SDHA* (sequencing only), SDHAF2*, SDHB*, SDHC*, SDHD*, SMAD4*, SMARCA4*, SMARCB1*, SMARCE1*, STK11*, SUFU*, TMEM127*, TP53*, TSC1*, TSC2*, VHL*. RNA analysis is performed for * genes.   04/21/2023 Surgery   Bilateral lumpectomies: Right lumpectomy: Intraductal papilloma 4 mm, right superior lumpectomy: Benign;  Left  lumpectomy: 1.1 cm DCIS grade 2, margins negative, ER 0%, PR 0%   04/21/2023 Cancer Staging   Staging form: Breast, AJCC 8th Edition - Pathologic stage from 04/21/2023: Stage 0 (pTis (DCIS), pN0, cM0) - Signed by Loa Socks, NP on 08/04/2023 Stage prefix: Initial diagnosis   06/01/2023 - 07/09/2023 Radiation Therapy   Plan Name: Breast_L_BH Site: Breast, Left Technique: 3D Mode: Photon Dose Per Fraction: 2.66 Gy Prescribed Dose (Delivered / Prescribed): 42.56 Gy / 42.56 Gy Prescribed Fxs (Delivered / Prescribed): 16 / 16   Plan Name: Brst_L_Bst_BH Site: Breast, Left Technique: 3D Mode: Photon Dose Per Fraction: 2 Gy Prescribed Dose (Delivered / Prescribed): 8 Gy / 8 Gy Prescribed Fxs (Delivered / Prescribed): 4 / 4     INTERVAL HISTORY:  Amanda Shepherd to review her survivorship care plan detailing her treatment course for breast cancer, as well as monitoring long-term side effects of that treatment, education regarding health maintenance, screening, and overall wellness and health promotion.     Overall, Amanda Shepherd reports feeling quite well, she tells me that her breast is healing well after completing radiation therapy.  She denies any new issues today.  REVIEW OF SYSTEMS:  Review of Systems  Constitutional:  Negative for appetite change, chills, fatigue, fever and unexpected weight change.  HENT:   Negative for hearing loss, lump/mass and trouble swallowing.   Eyes:  Negative for eye problems and icterus.  Respiratory:  Negative for chest tightness, cough and shortness of breath.   Cardiovascular:  Negative for chest pain, leg swelling and palpitations.  Gastrointestinal:  Negative for abdominal distention, abdominal pain, constipation, diarrhea, nausea and vomiting.  Endocrine: Negative for hot flashes.  Genitourinary:  Negative for difficulty urinating.   Musculoskeletal:  Negative for arthralgias.  Skin:  Negative for itching and rash.  Neurological:  Negative for  dizziness, extremity weakness, headaches and numbness.  Hematological:  Negative for adenopathy. Does not bruise/bleed easily.  Psychiatric/Behavioral:  Negative for depression. The patient is not nervous/anxious.   Breast: Denies any new nodularity, masses, tenderness, nipple changes, or nipple discharge.       PAST MEDICAL/SURGICAL HISTORY:  Past Medical History:  Diagnosis Date   Bronchitis    Cough in adult 10/27/2013   Diabetes mellitus    Hyperlipidemia    Hypertension    Past Surgical History:  Procedure Laterality Date   ABDOMINAL HYSTERECTOMY     BREAST BIOPSY Left 03/03/2023   MM LT BREAST BX W LOC DEV 1ST LESION IMAGE BX SPEC STEREO GUIDE 03/03/2023 [facility]   BREAST BIOPSY Right 03/03/2023   MM RT BREAST BX W LOC DEV 1ST LESION IMAGE BX SPEC STEREO GUIDE 03/03/2023 [facility]   BREAST BIOPSY Right 03/12/2023   MM RT BREAST BX W LOC DEV 1ST LESION IMAGE BX SPEC STEREO GUIDE 03/12/2023 [facility]   BREAST BIOPSY  04/20/2023   MM LT RADIOACTIVE SEED LOC MAMMO GUIDE 04/20/2023 [facility]   BREAST BIOPSY  04/20/2023   MM RT RADIOACTIVE SEED LOC MAMMO GUIDE 04/20/2023 [facility]   BREAST BIOPSY  04/20/2023   MM RT RADIOACTIVE SEED EA ADD LESION LOC MAMMO GUIDE 04/20/2023 [facility]   BREAST LUMPECTOMY WITH RADIOACTIVE SEED LOCALIZATION Bilateral 04/21/2023   Procedure: BILATERAL BREAST LUMPECTOMY WITH RADIOACTIVE SEED LOCALIZATION;  Surgeon: Griselda Miner, MD;  Location: [facility];  Service: General;  Laterality: Bilateral;   COLONOSCOPY  07/15/2005   at [facility]-normal     ALLERGIES:  Allergies  Allergen Reactions   Atorvastatin Other (See Comments)    "did not feel right - memory impairment     CURRENT MEDICATIONS:  Outpatient Encounter Medications as of 08/05/2023  Medication Sig   alendronate (FOSAMAX) 70 MG tablet TAKE 1 TABLET BY MOUTH ONCE A WEEK WITH A FULL GLASS OF WATER ON AN EMPTY STOMACH.   azelastine  (ASTELIN) 0.1 % nasal spray Place 1 spray into both nostrils 2 (two) times daily. Use in each nostril as directed (Patient taking differently: Place 1 spray into both nostrils 2 (two) times daily as needed for allergies. Use in each nostril as directed)   Blood Glucose Monitoring Suppl (ONE TOUCH ULTRA 2) w/Device KIT USE TO CHECK GLUCOSE BEFORE MEAL(S) AND  AT BEDTIME   calcium-vitamin D (OSCAL WITH D) 500-5 MG-MCG tablet Take 1 tablet by mouth daily with breakfast.   cetirizine (ZYRTEC) 10 MG tablet Take 1 tablet (10 mg total) by mouth daily as needed for allergies.   dapagliflozin propanediol (FARXIGA) 10 MG TABS tablet Take 1 tablet by mouth once daily   Dulaglutide (TRULICITY) 3 MG/0.5ML SOPN Inject 3 mg into the skin once a week.   fluticasone (FLONASE) 50 MCG/ACT nasal spray Place 1 spray into both nostrils daily.   glucose blood (ONETOUCH ULTRA) test strip Use to check blood sugar up to 4 times daily   Lancets (ONETOUCH DELICA PLUS LANCET33G) MISC USE TO CHECK BLOOD SUGARS FOUR TIMES DAILY.   losartan (COZAAR) 25 MG tablet Take 1 tablet (25 mg total) by mouth at bedtime.   metFORMIN (GLUCOPHAGE-XR) 500 MG 24 hr tablet Take 2 tablets (1,000 mg total) by mouth 2 (two) times daily with a meal.   Multiple Vitamin (MULTIVITAMIN) tablet Take 1 tablet by mouth daily. Centrum Silver   rosuvastatin (CRESTOR) 20 MG tablet Take 1 tablet (20 mg  total) by mouth daily.   No facility-administered encounter medications on file as of 08/05/2023.     ONCOLOGIC FAMILY HISTORY:  Family History  Problem Relation Age of Onset   Hypertension Mother    Thyroid cancer Son        dx 74s   Colon cancer Neg Hx      SOCIAL HISTORY:  Social History   Socioeconomic History   Marital status: Single    Spouse name: Not on file   Number of children: 2   Years of education: Not on file   Highest education level: Not on file  Occupational History   Occupation: Retired  Tobacco Use   Smoking status: Never    Smokeless tobacco: Never  Vaping Use   Vaping status: Never Used  Substance and Sexual Activity   Alcohol use: No   Drug use: No   Sexual activity: Never  Other Topics Concern   Not on file  Social History Narrative   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    Social Determinants of Health   Financial Resource Strain: Low Risk  (01/07/2023)   Overall Financial Resource Strain (CARDIA)    Difficulty of Paying Living Expenses: Not hard at all  Food Insecurity: Food Insecurity Present (05/25/2023)   Hunger Vital Sign    Worried About Running Out of Food in the Last Year: Sometimes true    Ran Out of Food in the Last Year: Never true  Transportation Needs: No Transportation Needs (05/25/2023)   PRAPARE - Administrator, Civil Service (Medical): No    Lack of Transportation (Non-Medical): No  Physical Activity: Inactive (01/07/2023)   Exercise Vital Sign    Days of Exercise per Week: 0 days    Minutes of Exercise per Session: 0 min  Stress: No Stress Concern Present (01/07/2023)   [employer] of Occupational Health - Occupational Stress Questionnaire    Feeling of Stress : Not at all  Social Connections: Moderately Isolated (01/07/2023)   Social Connection and Isolation Panel [NHANES]    Frequency of Communication with Friends and Family: More than three times a week    Frequency of Social Gatherings with Friends and Family: Once a week    Attends Religious Services: More than 4 times per year    Active Member of [employer] or Organizations: No    Attends Banker Meetings: Never    Marital Status: Divorced  Catering manager Violence: Not At Risk (05/25/2023)   Humiliation, Afraid, Rape, and Kick questionnaire    Fear of Current or Ex-Partner: No    Emotionally Abused: No    Physically Abused: No    Sexually Abused: No     OBSERVATIONS/OBJECTIVE:  BP 139/74 (BP Location: Left Arm,  Patient Position: Sitting)   Pulse 79   Temp 97.7 F (36.5 C) (Temporal)   Resp 18   Ht 5\' 6"  (1.676 m)   Wt 126 lb 8 oz (57.4 kg)   SpO2 100%   BMI 20.42 kg/m  GENERAL: Patient is a well appearing female in no acute distress HEENT:  Sclerae anicteric.  Oropharynx clear and moist. No ulcerations or evidence of oropharyngeal candidiasis. Neck is supple.  NODES:  No cervical, supraclavicular, or axillary lymphadenopathy palpated.  BREAST EXAM:  left breast s/p lumpectomy and radiation, no sign of local recurrence, right breast s/p lumpectomy, benign.   LUNGS:  Clear to auscultation  bilaterally.  No wheezes or rhonchi. HEART:  Regular rate and rhythm. No murmur appreciated. ABDOMEN:  Soft, nontender.  Positive, normoactive bowel sounds. No organomegaly palpated. MSK:  No focal spinal tenderness to palpation. Full range of motion bilaterally in the upper extremities. EXTREMITIES:  No peripheral edema.   SKIN:  Clear with no obvious rashes or skin changes. No nail dyscrasia. NEURO:  Nonfocal. Well oriented.  Appropriate affect.   LABORATORY DATA:  None for this visit.  DIAGNOSTIC IMAGING:  None for this visit.      ASSESSMENT AND PLAN:  Ms.. Shepherd is a pleasant 72 y.o. female with Stage 0 left breast DCIS, ER-/PR-, diagnosed in 02/2023, treated with lumpectomy, adjuvant radiation therapy.  She presents to the Survivorship Clinic for our initial meeting and routine follow-up post-completion of treatment for breast cancer.    1. Stage 0 left breast cancer:  Amanda Shepherd is continuing to recover from definitive treatment for breast cancer. She will follow-up with [place] in 6 months with history and physical exam per surveillance protocol.  Her mammogram is due 02/2024; orders placed today.   Today, a comprehensive survivorship care plan and treatment summary was reviewed with the patient today detailing her breast cancer diagnosis, treatment course, potential late/long-term effects of treatment,  appropriate follow-up care with recommendations for the future, and patient education resources.  A copy of this summary, along with a letter will be sent to the patient's primary care provider via mail/fax/In Basket message after today's visit.    2. Bone health:   She was given education on specific activities to promote bone health.  3. Cancer screening:  Due to Amanda Shepherd's history and her age, she should receive screening for skin cancers, colon cancer.  The information and recommendations are listed on the patient's comprehensive care plan/treatment summary and were reviewed in detail with the patient.    4. Health maintenance and wellness promotion: Amanda Shepherd was encouraged to consume 5-7 servings of fruits and vegetables per day. We reviewed the "Nutrition Rainbow" handout.  She was also encouraged to engage in moderate to vigorous exercise for 30 minutes per day most days of the week.  She was instructed to limit her alcohol consumption and continue to abstain from tobacco use.     #. Support services/counseling: It is not uncommon for this period of the patient's cancer care trajectory to be one of many emotions and stressors.   She was given information regarding our available services and encouraged to contact me with any questions or for help enrolling in any of our support group/programs.    Follow up instructions:    -Return to cancer center in 6 months for f/u   -Mammogram due in 02/2024 -She is welcome to return back to the Survivorship Clinic at any time; no additional follow-up needed at this time.  -Consider referral back to survivorship as a long-term survivor for continued surveillance  The patient was provided an opportunity to ask questions and all were answered. The patient agreed with the plan and demonstrated an understanding of the instructions.   Total encounter time:30 minutes*in face-to-face visit time, chart review, lab review, care coordination, order entry, and  documentation of the encounter time.    Lillard Anes, NP 08/05/23 9:08 AM Medical Oncology and Hematology [facility] [address] [place], [place] [address] Tel. [phone]    Fax. [phone]  *Total Encounter Time as defined by the Centers for Medicare and Medicaid Services includes, in addition to  the face-to-face time of a patient visit (documented in the note above) non-face-to-face time: obtaining and reviewing outside history, ordering and reviewing medications, tests or procedures, care coordination (communications with other health care professionals or caregivers) and documentation in the medical record.

## 2023-08-06 ENCOUNTER — Telehealth: Payer: Self-pay | Admitting: Adult Health

## 2023-08-06 NOTE — Telephone Encounter (Signed)
Spoke with patient confirming upcoming appointment  

## 2023-09-08 ENCOUNTER — Ambulatory Visit (INDEPENDENT_AMBULATORY_CARE_PROVIDER_SITE_OTHER): Payer: Medicare Other | Admitting: Family Medicine

## 2023-09-08 ENCOUNTER — Other Ambulatory Visit (HOSPITAL_COMMUNITY): Payer: Self-pay

## 2023-09-08 ENCOUNTER — Encounter: Payer: Self-pay | Admitting: Family Medicine

## 2023-09-08 VITALS — BP 142/83 | HR 81 | Ht 66.0 in | Wt 130.0 lb

## 2023-09-08 DIAGNOSIS — Z780 Asymptomatic menopausal state: Secondary | ICD-10-CM | POA: Diagnosis not present

## 2023-09-08 DIAGNOSIS — E785 Hyperlipidemia, unspecified: Secondary | ICD-10-CM | POA: Diagnosis not present

## 2023-09-08 DIAGNOSIS — I10 Essential (primary) hypertension: Secondary | ICD-10-CM | POA: Diagnosis present

## 2023-09-08 DIAGNOSIS — Z13820 Encounter for screening for osteoporosis: Secondary | ICD-10-CM

## 2023-09-08 DIAGNOSIS — Z7985 Long-term (current) use of injectable non-insulin antidiabetic drugs: Secondary | ICD-10-CM | POA: Diagnosis not present

## 2023-09-08 DIAGNOSIS — E1169 Type 2 diabetes mellitus with other specified complication: Secondary | ICD-10-CM

## 2023-09-08 MED ORDER — TRULICITY 3 MG/0.5ML ~~LOC~~ SOAJ
3.0000 mg | SUBCUTANEOUS | 1 refills | Status: DC
  Filled 2023-09-08: qty 2, 28d supply, fill #0
  Filled 2023-10-19: qty 2, 28d supply, fill #1

## 2023-09-08 MED ORDER — LOSARTAN POTASSIUM 50 MG PO TABS
50.0000 mg | ORAL_TABLET | Freq: Every day | ORAL | 0 refills | Status: DC
  Filled 2023-09-08: qty 30, 30d supply, fill #0

## 2023-09-08 MED ORDER — ALENDRONATE SODIUM 70 MG PO TABS
70.0000 mg | ORAL_TABLET | ORAL | 0 refills | Status: DC
  Filled 2023-09-08: qty 4, 28d supply, fill #0

## 2023-09-08 MED ORDER — DAPAGLIFLOZIN PROPANEDIOL 10 MG PO TABS
10.0000 mg | ORAL_TABLET | Freq: Every day | ORAL | 0 refills | Status: AC
  Filled 2023-09-08: qty 90, 90d supply, fill #0

## 2023-09-08 MED ORDER — ROSUVASTATIN CALCIUM 20 MG PO TABS
20.0000 mg | ORAL_TABLET | Freq: Every day | ORAL | 3 refills | Status: DC
  Filled 2023-09-08 – 2023-10-19 (×2): qty 90, 90d supply, fill #0

## 2023-09-08 NOTE — Patient Instructions (Signed)
It was wonderful to see you today.  Please bring ALL of your medications with you to every visit.   Today we talked about:  Blood Pressure - Your blood pressure is still elevated so we will increase your losartan to 50 mg. We will do another blood pressure check in one month. Please try to make some diet changes like switching from jaw bacon to [place] bacon.    We will also check your A1c next month. Work on Scientific laboratory technician working again.   Thank you for choosing [facility].   Please call [phone] with any questions about today's appointment.  Lockie Mola, MD  Family Medicine

## 2023-09-08 NOTE — Progress Notes (Signed)
    SUBJECTIVE:   CHIEF COMPLAINT / HPI:   Follow Up HTN Patient has been taking losartan 25 mg daily without issues. She feels like her BP has been higher because she has been eating a lot of bacon as well she has been baby sitting her 71 year old grandson who is very active and hyper. Denies cough or swelling. No vision changes, shortness of breath or chest pain.    Patient has been out of her bisphosphonate as well as is unsure of her other medications and if she has enough or not. Does not have help for organization and making sure she takes her medications.   PERTINENT  PMH / PSH: HTN, ductal in carcinoma, HLD, T2DM   OBJECTIVE:   BP (!) 142/83   Pulse 81   Ht 5\' 6"  (1.676 m)   Wt 130 lb (59 kg)   SpO2 100%   BMI 20.98 kg/m   General: well appearing, in no acute distress CV: RRR, radial pulses equal and palpable, no BLE edema  Resp: Normal work of breathing on room air, CTAB Abd: Soft, non tender, non distended  Neuro: Alert & Oriented x 4  Foot: No lesions, intact proprioception and sensation    ASSESSMENT/PLAN:   Assessment & Plan Primary hypertension Uncontrolled hypertension. Has been taking her medication as prescribed. Most likely due to dietary factors.  - Counseled regarding dietary changes. Patient will try [place] bacon.  - Increase losartan from 25 mg to 50 mg  - BMP today  - Follow up in 4 weeks   Refilled alendronate, farxiga, rosuvastatin, and trulicity  Advised patient to bring her medications to next appointment   Lockie Mola, MD [facility]

## 2023-09-10 NOTE — Assessment & Plan Note (Signed)
Uncontrolled hypertension. Has been taking her medication as prescribed. Most likely due to dietary factors.  - Counseled regarding dietary changes. Patient will try [place] bacon.  - Increase losartan from 25 mg to 50 mg  - BMP today  - Follow up in 4 weeks

## 2023-09-13 ENCOUNTER — Other Ambulatory Visit (HOSPITAL_COMMUNITY): Payer: Self-pay

## 2023-09-13 ENCOUNTER — Other Ambulatory Visit: Payer: Self-pay

## 2023-09-14 ENCOUNTER — Other Ambulatory Visit (HOSPITAL_COMMUNITY): Payer: Self-pay

## 2023-09-20 ENCOUNTER — Ambulatory Visit
Admission: RE | Admit: 2023-09-20 | Discharge: 2023-09-20 | Disposition: A | Discharge status: Home / Self Care | Payer: Medicare Other | Source: Ambulatory Visit | Attending: Radiation Oncology | Admitting: Radiation Oncology

## 2023-09-20 ENCOUNTER — Other Ambulatory Visit: Payer: Self-pay | Admitting: Family Medicine

## 2023-09-20 DIAGNOSIS — I10 Essential (primary) hypertension: Secondary | ICD-10-CM

## 2023-09-20 DIAGNOSIS — D0512 Intraductal carcinoma in situ of left breast: Secondary | ICD-10-CM | POA: Insufficient documentation

## 2023-09-20 NOTE — Progress Notes (Signed)
  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 (as documented in provider EOT note)  The patient was available for call today.   Symptoms of fatigue have improved since completing therapy.  Symptoms of skin changes have improved since completing therapy.  The patient was encouraged to avoid sun exposure in the area of prior treatment for up to one year following radiation with either sunscreen or by the style of clothing worn in the sun.  The patient has scheduled follow up with her medical oncologist Dr. Pamelia Hoit for ongoing surveillance, and was encouraged to call if she develops concerns or questions regarding radiation.   This concludes the interaction.  Ruel Favors, LPN

## 2023-10-01 ENCOUNTER — Ambulatory Visit: Payer: Self-pay

## 2023-10-06 ENCOUNTER — Ambulatory Visit: Payer: Self-pay | Admitting: Family Medicine

## 2023-10-08 ENCOUNTER — Encounter (HOSPITAL_COMMUNITY): Payer: Self-pay

## 2023-10-08 ENCOUNTER — Ambulatory Visit (INDEPENDENT_AMBULATORY_CARE_PROVIDER_SITE_OTHER): Payer: Medicare Other | Admitting: Family Medicine

## 2023-10-08 ENCOUNTER — Other Ambulatory Visit (HOSPITAL_COMMUNITY): Payer: Self-pay

## 2023-10-08 ENCOUNTER — Encounter: Payer: Self-pay | Admitting: Family Medicine

## 2023-10-08 VITALS — BP 129/74 | HR 82 | Ht 66.0 in | Wt 129.4 lb

## 2023-10-08 DIAGNOSIS — I10 Essential (primary) hypertension: Secondary | ICD-10-CM

## 2023-10-08 DIAGNOSIS — E1169 Type 2 diabetes mellitus with other specified complication: Secondary | ICD-10-CM

## 2023-10-08 LAB — POCT GLYCOSYLATED HEMOGLOBIN (HGB A1C): HbA1c, POC (controlled diabetic range): 8.8 % — AB (ref 0.0–7.0)

## 2023-10-08 MED ORDER — BLOOD GLUCOSE MONITOR KIT
PACK | 0 refills | Status: DC
  Filled 2023-10-08: qty 1, 30d supply, fill #0

## 2023-10-08 MED ORDER — METFORMIN HCL ER 500 MG PO TB24
1000.0000 mg | ORAL_TABLET | Freq: Two times a day (BID) | ORAL | 0 refills | Status: AC
  Filled 2023-10-08: qty 180, 45d supply, fill #0

## 2023-10-08 MED ORDER — GLUCOSE BLOOD VI STRP
ORAL_STRIP | Freq: Four times a day (QID) | 0 refills | Status: DC
  Filled 2023-10-08 (×2): qty 100, 25d supply, fill #0

## 2023-10-08 MED ORDER — FREESTYLE LANCETS MISC
Freq: Four times a day (QID) | 0 refills | Status: DC
  Filled 2023-10-08: qty 100, 25d supply, fill #0

## 2023-10-08 NOTE — Patient Instructions (Signed)
It was wonderful to see you today.  Please bring ALL of your medications with you to every visit.   Today we talked about:  Diabetes - Your A1c is slightly worse compared to last time. This time it is 8.8. It is VERY important that you reduce your soda intake. Our new goal is go down to one 8 oz soda a day and it should be sugar free.  I sent in a new glucometer prescription.   Please follow up in 3 months   Thank you for choosing [facility].   Please call [phone] with any questions about today's appointment.  Please be sure to schedule follow up at the front desk before you leave today.   Lockie Mola, MD  Family Medicine

## 2023-10-08 NOTE — Progress Notes (Signed)
    SUBJECTIVE:   CHIEF COMPLAINT / HPI:   Hypertension  Patient says she is taking her medications daily. Denies any side effects. Took her medications this morning.   Diabetes  Patient reports she has been drinking large amounts of soda. Says she drinks at least 4 sodas a day. She did have some weeks where she missed her trulicity dose or forgot her metformin. A couple of the times patient missed the dose was due to having a procedure done.   PERTINENT  PMH / PSH: HTN, T2DM, HLD, DCIS of breast (has finished therapy)   OBJECTIVE:   BP 129/74   Pulse 82   Ht 5\' 6"  (1.676 m)   Wt 129 lb 6.4 oz (58.7 kg)   SpO2 100%   BMI 20.89 kg/m   General: well appearing, thin, in no acute distress CV: RRR, radial pulses equal and palpable, no BLE edema  Resp: Normal work of breathing on room air, CTAB Abd: Soft, non tender, non distended  Neuro: Alert & Oriented x 4    ASSESSMENT/PLAN:   Assessment & Plan Type 2 diabetes mellitus with other specified complication, without long-term current use of insulin (HCC) A1c worsened to 8.8 from 8.6. Could be due to missed doses of medication along with diet (specifically soda). Given patient is already very thin, would not want to increase trulicity.  - Discussed adherence to medication  - Goal of decreasing to one sugar free soda a day.  - Follow up in 3 months  - Continue farxiga, trulicity, metformin  Primary hypertension Controlled  - Continue losartan       Lockie Mola, MD [facility]

## 2023-10-08 NOTE — Assessment & Plan Note (Signed)
A1c worsened to 8.8 from 8.6. Could be due to missed doses of medication along with diet (specifically soda). Given patient is already very thin, would not want to increase trulicity.  - Discussed adherence to medication  - Goal of decreasing to one sugar free soda a day.  - Follow up in 3 months  - Continue farxiga, trulicity, metformin

## 2023-10-08 NOTE — Assessment & Plan Note (Signed)
Controlled. Continue losartan.

## 2023-10-09 LAB — BASIC METABOLIC PANEL
BUN/Creatinine Ratio: 16 (ref 12–28)
BUN: 26 mg/dL (ref 8–27)
CO2: 21 mmol/L (ref 20–29)
Calcium: 9.5 mg/dL (ref 8.7–10.3)
Chloride: 104 mmol/L (ref 96–106)
Creatinine, Ser: 1.6 mg/dL — ABNORMAL HIGH (ref 0.57–1.00)
Glucose: 176 mg/dL — ABNORMAL HIGH (ref 70–99)
Potassium: 4.5 mmol/L (ref 3.5–5.2)
Sodium: 142 mmol/L (ref 134–144)
eGFR: 34 mL/min/{1.73_m2} — ABNORMAL LOW (ref >59)

## 2023-10-11 ENCOUNTER — Other Ambulatory Visit (HOSPITAL_COMMUNITY): Payer: Self-pay

## 2023-10-19 ENCOUNTER — Other Ambulatory Visit: Payer: Self-pay | Admitting: Family Medicine

## 2023-10-19 ENCOUNTER — Other Ambulatory Visit (HOSPITAL_COMMUNITY): Payer: Self-pay

## 2023-10-19 DIAGNOSIS — I10 Essential (primary) hypertension: Secondary | ICD-10-CM

## 2023-10-19 DIAGNOSIS — Z13820 Encounter for screening for osteoporosis: Secondary | ICD-10-CM

## 2023-10-21 ENCOUNTER — Other Ambulatory Visit (HOSPITAL_COMMUNITY): Payer: Self-pay

## 2023-10-21 MED ORDER — ALENDRONATE SODIUM 70 MG PO TABS
70.0000 mg | ORAL_TABLET | ORAL | 0 refills | Status: AC
  Filled 2023-10-21: qty 12, 84d supply, fill #0

## 2023-10-21 MED ORDER — LOSARTAN POTASSIUM 50 MG PO TABS
50.0000 mg | ORAL_TABLET | Freq: Every day | ORAL | 3 refills | Status: DC
  Filled 2023-10-21: qty 30, 30d supply, fill #0

## 2023-10-25 ENCOUNTER — Other Ambulatory Visit (HOSPITAL_COMMUNITY): Payer: Self-pay

## 2023-12-02 ENCOUNTER — Other Ambulatory Visit (HOSPITAL_COMMUNITY): Payer: Self-pay

## 2023-12-02 ENCOUNTER — Other Ambulatory Visit: Payer: Self-pay | Admitting: Family Medicine

## 2023-12-02 DIAGNOSIS — E1169 Type 2 diabetes mellitus with other specified complication: Secondary | ICD-10-CM

## 2023-12-03 ENCOUNTER — Other Ambulatory Visit (HOSPITAL_COMMUNITY): Payer: Self-pay

## 2023-12-03 MED ORDER — TRULICITY 3 MG/0.5ML ~~LOC~~ SOAJ
3.0000 mg | SUBCUTANEOUS | 1 refills | Status: AC
  Filled 2023-12-03: qty 2, 28d supply, fill #0
  Filled 2024-01-18: qty 2, 28d supply, fill #1

## 2023-12-08 ENCOUNTER — Telehealth: Payer: Self-pay | Admitting: *Deleted

## 2023-12-08 ENCOUNTER — Other Ambulatory Visit (HOSPITAL_COMMUNITY): Payer: Self-pay

## 2023-12-08 ENCOUNTER — Encounter: Payer: Self-pay | Admitting: *Deleted

## 2023-12-08 NOTE — Telephone Encounter (Signed)
Patient was identified as falling into the [place] Measure - Diabetes.   Patient was: SENT A MYCHART MESSAGE TO MAKE AN APPT

## 2023-12-14 ENCOUNTER — Other Ambulatory Visit (HOSPITAL_COMMUNITY): Payer: Self-pay

## 2023-12-27 ENCOUNTER — Telehealth: Payer: Self-pay | Admitting: Pharmacist

## 2023-12-27 NOTE — Telephone Encounter (Signed)
 Patient contacted for follow-up of Diabetes Care TNM initiative.  Since last contact patient reports her cancer treatment went very well.  She thanked the [facility] for all of their help and care.  She specifically thanked Dr. Marsh Dolly for her patience and caring approach.   She also shared that she is transferring her care to "[facility]" for her care to be managed by someone who focuses on "old people".   She was grateful for her care and appreciated the call.    Total time with patient call and documentation of interaction: 9 minutes.  F/U Phone call planned: NONE  I shared that I would communicate her transition to her PCP and our practice.

## 2023-12-28 NOTE — Telephone Encounter (Signed)
 reviewed

## 2024-01-18 ENCOUNTER — Other Ambulatory Visit (HOSPITAL_COMMUNITY): Payer: Self-pay

## 2024-01-31 ENCOUNTER — Encounter

## 2024-02-04 ENCOUNTER — Ambulatory Visit: Payer: Medicare Other | Admitting: Adult Health

## 2024-02-08 ENCOUNTER — Inpatient Hospital Stay: Attending: Adult Health | Admitting: Adult Health

## 2024-02-08 ENCOUNTER — Telehealth: Payer: Self-pay | Admitting: *Deleted

## 2024-02-08 ENCOUNTER — Encounter: Payer: Self-pay | Admitting: *Deleted

## 2024-02-08 VITALS — BP 159/83 | HR 87 | Temp 97.3°F | Resp 19 | Ht 66.0 in | Wt 121.4 lb

## 2024-02-08 DIAGNOSIS — D0512 Intraductal carcinoma in situ of left breast: Secondary | ICD-10-CM

## 2024-02-08 DIAGNOSIS — Z923 Personal history of irradiation: Secondary | ICD-10-CM | POA: Insufficient documentation

## 2024-02-08 DIAGNOSIS — Z86000 Personal history of in-situ neoplasm of breast: Secondary | ICD-10-CM | POA: Diagnosis present

## 2024-02-08 NOTE — Telephone Encounter (Signed)
 Called [facility] to get fax number to fax over signed medical release form to facility. Customer service called and stated that they will contact the facility to reach out to office and provided fax number.

## 2024-02-08 NOTE — Patient Instructions (Signed)

## 2024-02-08 NOTE — Progress Notes (Unsigned)
 [facility] Cancer Follow up:    System, Provider Not In No address on file   DIAGNOSIS: Cancer Staging  Ductal carcinoma in situ (DCIS) of left breast Staging form: Breast, AJCC 8th Edition - Clinical stage from 03/17/2023: Stage 0 (cTis (DCIS), cN0, cM0, ER-, PR-, HER2: Not Assessed) - Signed by Cameron Cea, MD on 03/17/2023 Stage prefix: Initial diagnosis Nuclear grade: G2 Laterality: Left Staged by: Pathologist and managing physician National guidelines used in treatment planning: Yes Type of national guideline used in treatment planning: NCCN - Pathologic stage from 04/21/2023: Stage 0 (pTis (DCIS), pN0, cM0) - Signed by Percival Brace, NP on 08/04/2023 Stage prefix: Initial diagnosis    SUMMARY OF ONCOLOGIC HISTORY: Oncology History  Ductal carcinoma in situ (DCIS) of left breast  03/12/2023 Initial Diagnosis   Screening mammogram detected bilateral breast calcifications.  Left breast 1.5 cm biopsy: DCIS intermediate grade solid and cribriform type with necrosis ER 0%, PR 0%.  Right breast retroareolar calcifications 0.4 cm stereotactic biopsy was benign discordant rebiopsy intraductal papilloma   03/17/2023 Cancer Staging   Staging form: Breast, AJCC 8th Edition - Clinical stage from 03/17/2023: Stage 0 (cTis (DCIS), cN0, cM0, ER-, PR-, HER2: Not Assessed) - Signed by Cameron Cea, MD on 03/17/2023 Stage prefix: Initial diagnosis Nuclear grade: G2 Laterality: Left Staged by: Pathologist and managing physician National guidelines used in treatment planning: Yes Type of national guideline used in treatment planning: NCCN   03/24/2023 Genetic Testing   Negative [facility] Multi-Cancer + RNA Panel. VUS detected in APC c.4886A>G (p.His1629Arg) and TSC2 c.-30+1G>T (Splice donor). Report date is March 24, 2023.   The Multi-Cancer + RNA Panel offered by [facility] includes sequencing and/or deletion/duplication analysis of the following 70 genes:  AIP*, ALK, APC*,  ATM*, AXIN2*, BAP1*, BARD1*, BLM*, BMPR1A*, BRCA1*, BRCA2*, BRIP1*, CDC73*, CDH1*, CDK4, CDKN1B*, CDKN2A, CHEK2*, CTNNA1*, DICER1*, EPCAM (del/dup only), EGFR, FH*, FLCN*, GREM1 (promoter dup only), HOXB13, KIT, LZTR1, MAX*, MBD4, MEN1*, MET, MITF, MLH1*, MSH2*, MSH3*, MSH6*, MUTYH*, NF1*, NF2*, NTHL1*, PALB2*, PDGFRA, PMS2*, POLD1*, POLE*, POT1*, PRKAR1A*, PTCH1*, PTEN*, RAD51C*, RAD51D*, RB1*, RET, SDHA* (sequencing only), SDHAF2*, SDHB*, SDHC*, SDHD*, SMAD4*, SMARCA4*, SMARCB1*, SMARCE1*, STK11*, SUFU*, TMEM127*, TP53*, TSC1*, TSC2*, VHL*. RNA analysis is performed for * genes.   04/21/2023 Surgery   Bilateral lumpectomies: Right lumpectomy: Intraductal papilloma 4 mm, right superior lumpectomy: Benign;  Left lumpectomy: 1.1 cm DCIS grade 2, margins negative, ER 0%, PR 0%   04/21/2023 Cancer Staging   Staging form: Breast, AJCC 8th Edition - Pathologic stage from 04/21/2023: Stage 0 (pTis (DCIS), pN0, cM0) - Signed by Percival Brace, NP on 08/04/2023 Stage prefix: Initial diagnosis   06/01/2023 - 07/09/2023 Radiation Therapy   Plan Name: Breast_L_BH Site: Breast, Left Technique: 3D Mode: Photon Dose Per Fraction: 2.66 Gy Prescribed Dose (Delivered / Prescribed): 42.56 Gy / 42.56 Gy Prescribed Fxs (Delivered / Prescribed): 16 / 16   Plan Name: Brst_L_Bst_BH Site: Breast, Left Technique: 3D Mode: Photon Dose Per Fraction: 2 Gy Prescribed Dose (Delivered / Prescribed): 8 Gy / 8 Gy Prescribed Fxs (Delivered / Prescribed): 4 / 4     CURRENT THERAPY:  INTERVAL HISTORY:  Discussed the use of AI scribe software for clinical note transcription with the patient, who gave verbal consent to proceed.  Amanda Shepherd 73 y.o. female returns for    Patient Active Problem List   Diagnosis Date Noted   Ductal carcinoma in situ (DCIS) of left breast 03/16/2023   Seasonal allergies 02/04/2023  Osteoporosis 06/01/2022   CKD (chronic kidney disease), stage III (HCC) 03/03/2021   Genetic  testing 07/08/2017   Mild non proliferative diabetic retinopathy (HCC) 06/04/2014   Primary hypertension 09/22/2010   Type 2 diabetes mellitus (HCC) 02/14/2007   Hyperlipidemia associated with type 2 diabetes mellitus (HCC) 02/14/2007    is allergic to atorvastatin.  MEDICAL HISTORY: Past Medical History:  Diagnosis Date   Bronchitis    Cough in adult 10/27/2013   Diabetes mellitus    Hyperlipidemia    Hypertension     SURGICAL HISTORY: Past Surgical History:  Procedure Laterality Date   ABDOMINAL HYSTERECTOMY     BREAST BIOPSY Left 03/03/2023   MM LT BREAST BX W LOC DEV 1ST LESION IMAGE BX SPEC STEREO GUIDE 03/03/2023 [facility]   BREAST BIOPSY Right 03/03/2023   MM RT BREAST BX W LOC DEV 1ST LESION IMAGE BX SPEC STEREO GUIDE 03/03/2023 [facility]   BREAST BIOPSY Right 03/12/2023   MM RT BREAST BX W LOC DEV 1ST LESION IMAGE BX SPEC STEREO GUIDE 03/12/2023 [facility]   BREAST BIOPSY  04/20/2023   MM LT RADIOACTIVE SEED LOC MAMMO GUIDE 04/20/2023 [facility]   BREAST BIOPSY  04/20/2023   MM RT RADIOACTIVE SEED LOC MAMMO GUIDE 04/20/2023 [facility]   BREAST BIOPSY  04/20/2023   MM RT RADIOACTIVE SEED EA ADD LESION LOC MAMMO GUIDE 04/20/2023 [facility]   BREAST LUMPECTOMY WITH RADIOACTIVE SEED LOCALIZATION Bilateral 04/21/2023   Procedure: BILATERAL BREAST LUMPECTOMY WITH RADIOACTIVE SEED LOCALIZATION;  Surgeon: Caralyn Chandler, MD;  Location: [facility];  Service: General;  Laterality: Bilateral;   COLONOSCOPY  07/15/2005   at [facility]    SOCIAL HISTORY: Social History   Socioeconomic History   Marital status: Single    Spouse name: Not on file   Number of children: 2   Years of education: Not on file   Highest education level: Not on file  Occupational History   Occupation: Retired  Tobacco Use   Smoking status: Never   Smokeless tobacco: Never  Vaping Use   Vaping status: Never Used  Substance and Sexual Activity    Alcohol use: No   Drug use: No   Sexual activity: Never  Other Topics Concern   Not on file  Social History Narrative   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    Social Drivers of Corporate investment banker Strain: Low Risk  (01/07/2023)   Overall Financial Resource Strain (CARDIA)    Difficulty of Paying Living Expenses: Not hard at all  Food Insecurity: Food Insecurity Present (05/25/2023)   Hunger Vital Sign    Worried About Running Out of Food in the Last Year: Sometimes true    Ran Out of Food in the Last Year: Never true  Transportation Needs: No Transportation Needs (05/25/2023)   PRAPARE - Administrator, Civil Service (Medical): No    Lack of Transportation (Non-Medical): No  Physical Activity: Inactive (01/07/2023)   Exercise Vital Sign    Days of Exercise per Week: 0 days    Minutes of Exercise per Session: 0 min  Stress: No Stress Concern Present (01/07/2023)   [employer] of Occupational Health - Occupational Stress Questionnaire    Feeling of Stress : Not at all  Social Connections: Moderately Isolated (01/07/2023)   Social Connection and Isolation Panel [NHANES]    Frequency of Communication with  Friends and Family: More than three times a week    Frequency of Social Gatherings with Friends and Family: Once a week    Attends Religious Services: More than 4 times per year    Active Member of [employer] or Organizations: No    Attends Banker Meetings: Never    Marital Status: Divorced  Catering manager Violence: Not At Risk (05/25/2023)   Humiliation, Afraid, Rape, and Kick questionnaire    Fear of Current or Ex-Partner: No    Emotionally Abused: No    Physically Abused: No    Sexually Abused: No    FAMILY HISTORY: Family History  Problem Relation Age of Onset   Hypertension Mother    Thyroid cancer Son        dx 64s   Colon cancer Neg Hx     Review of Systems   Constitutional:  Negative for appetite change, chills, fatigue, fever and unexpected weight change.  HENT:   Negative for hearing loss, lump/mass and trouble swallowing.   Eyes:  Negative for eye problems and icterus.  Respiratory:  Negative for chest tightness, cough and shortness of breath.   Cardiovascular:  Negative for chest pain, leg swelling and palpitations.  Gastrointestinal:  Negative for abdominal distention, abdominal pain, constipation, diarrhea, nausea and vomiting.  Endocrine: Negative for hot flashes.  Genitourinary:  Negative for difficulty urinating.   Musculoskeletal:  Negative for arthralgias.  Skin:  Negative for itching and rash.  Neurological:  Negative for dizziness, extremity weakness, headaches and numbness.  Hematological:  Negative for adenopathy. Does not bruise/bleed easily.  Psychiatric/Behavioral:  Negative for depression. The patient is not nervous/anxious.       PHYSICAL EXAMINATION    Vitals:   02/08/24 0841  BP: (!) 159/83  Pulse: 87  Resp: 19  Temp: (!) 97.3 F (36.3 C)  SpO2: 100%    Physical Exam Constitutional:      General: She is not in acute distress.    Appearance: Normal appearance. She is not toxic-appearing.  HENT:     Head: Normocephalic and atraumatic.     Mouth/Throat:     Mouth: Mucous membranes are moist.     Pharynx: Oropharynx is clear. No oropharyngeal exudate or posterior oropharyngeal erythema.  Eyes:     General: No scleral icterus. Cardiovascular:     Rate and Rhythm: Normal rate and regular rhythm.     Pulses: Normal pulses.     Heart sounds: Normal heart sounds.  Pulmonary:     Effort: Pulmonary effort is normal.     Breath sounds: Normal breath sounds.  Abdominal:     General: Abdomen is flat. Bowel sounds are normal. There is no distension.     Palpations: Abdomen is soft.     Tenderness: There is no abdominal tenderness.  Musculoskeletal:        General: No swelling.     Cervical back: Neck supple.   Lymphadenopathy:     Cervical: No cervical adenopathy.  Skin:    General: Skin is warm and dry.     Findings: No rash.  Neurological:     General: No focal deficit present.     Mental Status: She is alert.  Psychiatric:        Mood and Affect: Mood normal.        Behavior: Behavior normal.     LABORATORY DATA:  CBC    Component Value Date/Time   WBC 5.9 03/17/2023 1159   WBC 7.9 01/06/2023 0105  RBC 3.83 (L) 03/17/2023 1159   HGB 11.5 (L) 03/17/2023 1159   HGB 11.9 06/07/2017 1206   HCT 35.6 (L) 03/17/2023 1159   HCT 35.2 06/07/2017 1206   PLT 322 03/17/2023 1159   PLT 339 06/07/2017 1206   MCV 93.0 03/17/2023 1159   MCV 90 06/07/2017 1206   MCH 30.0 03/17/2023 1159   MCHC 32.3 03/17/2023 1159   RDW 13.0 03/17/2023 1159   RDW 13.1 06/07/2017 1206   LYMPHSABS 2.1 03/17/2023 1159   LYMPHSABS 2.6 06/07/2017 1206   MONOABS 0.4 03/17/2023 1159   EOSABS 0.2 03/17/2023 1159   EOSABS 0.1 06/07/2017 1206   BASOSABS 0.0 03/17/2023 1159   BASOSABS 0.0 06/07/2017 1206    CMP     Component Value Date/Time   NA 142 10/08/2023 1046   K 4.5 10/08/2023 1046   CL 104 10/08/2023 1046   CO2 21 10/08/2023 1046   GLUCOSE 176 (H) 10/08/2023 1046   GLUCOSE 194 (H) 04/20/2023 1030   BUN 26 10/08/2023 1046   CREATININE 1.60 (H) 10/08/2023 1046   CREATININE 1.33 (H) 03/17/2023 1159   CREATININE 0.85 03/06/2013 1608   CALCIUM  9.5 10/08/2023 1046   PROT 8.3 (H) 03/17/2023 1159   PROT 7.6 06/07/2017 1206   ALBUMIN 4.3 03/17/2023 1159   ALBUMIN 4.4 06/07/2017 1206   AST 22 03/17/2023 1159   ALT 16 03/17/2023 1159   ALKPHOS 90 03/17/2023 1159   BILITOT 0.6 03/17/2023 1159   GFRNONAA 36 (L) 04/20/2023 1030   GFRNONAA 43 (L) 03/17/2023 1159   GFRAA 48 (L) 04/02/2020 1507     ASSESSMENT and THERAPY PLAN:   No problem-specific Assessment & Plan notes found for this encounter.     All questions were answered. The patient knows to call the clinic with any problems,  questions or concerns. We can certainly see the patient much sooner if necessary.  Total encounter time:*** minutes*in face-to-face visit time, chart review, lab review, care coordination, order entry, and documentation of the encounter time.    Alwin Baars, NP 02/08/24 8:48 AM Medical Oncology and Hematology [facility] [address] [place], [place] [address] Tel. [phone]    Fax. [phone]  *Total Encounter Time as defined by the Centers for Medicare and Medicaid Services includes, in addition to the face-to-face time of a patient visit (documented in the note above) non-face-to-face time: obtaining and reviewing outside history, ordering and reviewing medications, tests or procedures, care coordination (communications with other health care professionals or caregivers) and documentation in the medical record.

## 2024-02-10 ENCOUNTER — Encounter: Payer: Self-pay | Admitting: Adult Health

## 2024-02-10 NOTE — Assessment & Plan Note (Signed)
 Stage 0 ER/PR negative breast cancer.  Status post lumpectomy and radiation.  No clinical or radiographic signs of breast cancer recurrence.  Mammogram due next month.  - Continue observation with mammogram and clinical breast exam annually  Type 2 diabetes mellitus Concerns about medication side effects affecting memory and low weight complicating management. Emphasized dietary modifications to prevent complications. - Continue f/u with PCP for diabetes management - Provide dietary guidance to limit sugary drinks and simple carbohydrates. - Print dietary recommendations.  Hypertension Likely exacerbated by stress from current living situation.  Stress Significant stress from family living in her house affecting blood pressure and well-being. Discussed ways to reduce stress.    SDOH Provided with groceries from the [employer] today.    RTC in 1 year for continued observation and surveillance.

## 2024-02-28 ENCOUNTER — Ambulatory Visit
Admission: RE | Admit: 2024-02-28 | Discharge: 2024-02-28 | Disposition: A | Discharge status: Home / Self Care | Source: Ambulatory Visit | Attending: Adult Health | Admitting: Adult Health

## 2024-02-28 DIAGNOSIS — D0512 Intraductal carcinoma in situ of left breast: Secondary | ICD-10-CM

## 2024-03-06 ENCOUNTER — Encounter: Payer: Self-pay | Admitting: Family Medicine

## 2024-03-17 ENCOUNTER — Other Ambulatory Visit (HOSPITAL_COMMUNITY): Payer: Self-pay

## 2024-03-17 MED ORDER — ATORVASTATIN CALCIUM 10 MG PO TABS
10.0000 mg | ORAL_TABLET | Freq: Every day | ORAL | 3 refills | Status: AC
  Filled 2024-03-17: qty 90, 90d supply, fill #0

## 2024-03-29 ENCOUNTER — Other Ambulatory Visit (HOSPITAL_COMMUNITY): Payer: Self-pay

## 2024-07-24 ENCOUNTER — Other Ambulatory Visit (HOSPITAL_COMMUNITY): Payer: Self-pay

## 2024-07-24 MED ORDER — ROSUVASTATIN CALCIUM 20 MG PO TABS
20.0000 mg | ORAL_TABLET | Freq: Every day | ORAL | 3 refills | Status: DC
  Filled 2024-07-24: qty 90, 90d supply, fill #0

## 2024-07-24 MED ORDER — CALCIUM CARB-CHOLECALCIFEROL 500-5 MG-MCG PO TABS
1.0000 | ORAL_TABLET | Freq: Every day | ORAL | 3 refills | Status: AC
  Filled 2024-08-25: qty 90, 90d supply, fill #0
  Filled 2024-08-25: qty 60, 60d supply, fill #0

## 2024-07-24 MED ORDER — TRULICITY 3 MG/0.5ML ~~LOC~~ SOAJ
3.0000 mg | SUBCUTANEOUS | 3 refills | Status: DC
  Filled 2024-07-24: qty 6, 84d supply, fill #0

## 2024-07-24 MED ORDER — METFORMIN HCL 500 MG PO TABS
500.0000 mg | ORAL_TABLET | Freq: Two times a day (BID) | ORAL | 3 refills | Status: DC
  Filled 2024-07-24: qty 180, 90d supply, fill #0

## 2024-07-24 MED ORDER — DAPAGLIFLOZIN PROPANEDIOL 10 MG PO TABS
10.0000 mg | ORAL_TABLET | Freq: Every morning | ORAL | 3 refills | Status: DC
  Filled 2024-07-24: qty 90, 90d supply, fill #0

## 2024-07-25 ENCOUNTER — Encounter (HOSPITAL_COMMUNITY): Payer: Self-pay

## 2024-07-25 ENCOUNTER — Other Ambulatory Visit (HOSPITAL_COMMUNITY): Payer: Self-pay

## 2024-07-25 ENCOUNTER — Other Ambulatory Visit: Payer: Self-pay

## 2024-07-31 ENCOUNTER — Other Ambulatory Visit: Payer: Self-pay

## 2024-07-31 ENCOUNTER — Other Ambulatory Visit (HOSPITAL_COMMUNITY): Payer: Self-pay

## 2024-07-31 MED ORDER — METFORMIN HCL 500 MG PO TABS
500.0000 mg | ORAL_TABLET | Freq: Two times a day (BID) | ORAL | 3 refills | Status: DC
  Filled 2024-07-31: qty 180, 90d supply, fill #0

## 2024-07-31 MED ORDER — ALENDRONATE SODIUM 70 MG PO TABS
70.0000 mg | ORAL_TABLET | ORAL | 0 refills | Status: AC
  Filled 2024-07-31: qty 12, 84d supply, fill #0

## 2024-07-31 MED ORDER — DAPAGLIFLOZIN PROPANEDIOL 10 MG PO TABS
10.0000 mg | ORAL_TABLET | Freq: Every morning | ORAL | 3 refills | Status: DC
  Filled 2024-07-31: qty 90, 90d supply, fill #0

## 2024-08-02 ENCOUNTER — Other Ambulatory Visit (HOSPITAL_BASED_OUTPATIENT_CLINIC_OR_DEPARTMENT_OTHER): Payer: Self-pay | Admitting: Family Medicine

## 2024-08-02 DIAGNOSIS — Z13820 Encounter for screening for osteoporosis: Secondary | ICD-10-CM

## 2024-08-10 ENCOUNTER — Other Ambulatory Visit (HOSPITAL_COMMUNITY): Payer: Self-pay

## 2024-08-10 MED ORDER — LOSARTAN POTASSIUM 50 MG PO TABS
50.0000 mg | ORAL_TABLET | Freq: Every day | ORAL | 3 refills | Status: DC
  Filled 2024-08-10: qty 90, 90d supply, fill #0

## 2024-08-10 MED ORDER — SAXAGLIPTIN HCL 5 MG PO TABS
5.0000 mg | ORAL_TABLET | Freq: Every day | ORAL | 3 refills | Status: AC
  Filled 2024-08-10 – 2024-08-25 (×4): qty 90, 90d supply, fill #0

## 2024-08-10 MED ORDER — ROSUVASTATIN CALCIUM 20 MG PO TABS: 20.0000 mg | ORAL_TABLET | Freq: Every day | ORAL | 3 refills | Status: DC

## 2024-08-11 ENCOUNTER — Other Ambulatory Visit (HOSPITAL_COMMUNITY): Payer: Self-pay

## 2024-08-17 ENCOUNTER — Other Ambulatory Visit (HOSPITAL_COMMUNITY): Payer: Self-pay

## 2024-08-17 ENCOUNTER — Other Ambulatory Visit: Payer: Self-pay

## 2024-08-25 ENCOUNTER — Other Ambulatory Visit (HOSPITAL_COMMUNITY): Payer: Self-pay

## 2024-08-25 ENCOUNTER — Other Ambulatory Visit: Payer: Self-pay

## 2024-08-29 ENCOUNTER — Encounter: Payer: Self-pay | Admitting: Internal Medicine

## 2024-09-22 ENCOUNTER — Other Ambulatory Visit (HOSPITAL_BASED_OUTPATIENT_CLINIC_OR_DEPARTMENT_OTHER)

## 2024-10-09 ENCOUNTER — Ambulatory Visit: Admitting: *Deleted

## 2024-10-09 ENCOUNTER — Other Ambulatory Visit (HOSPITAL_COMMUNITY): Payer: Self-pay

## 2024-10-09 VITALS — Ht 66.0 in | Wt 128.0 lb

## 2024-10-09 DIAGNOSIS — Z860100 Personal history of colon polyps, unspecified: Secondary | ICD-10-CM

## 2024-10-09 MED ORDER — NA SULFATE-K SULFATE-MG SULF 17.5-3.13-1.6 GM/177ML PO SOLN
1.0000 | Freq: Once | ORAL | 0 refills | Status: AC
  Filled 2024-10-09: qty 354, 2d supply, fill #0

## 2024-10-09 NOTE — Progress Notes (Signed)
 Pt's name and DOB verified at the beginning of the pre-visit with 2 identifiers  Permission given to speak with sister present  Pt denies any difficulty with ambulating,sitting, laying down or rolling side to side  Pt has no issues moving head neck or swallowing  No egg or soy allergy known to patient   No issues known to pt with past sedation  No FH of Malignant Hyperthermia  Pt is not on home 02   Pt is not on blood thinners   Pt denies issues with constipation   Pt is not on dialysis  Pt denise any abnormal heart rhythms   Pt denies any upcoming cardiac testing  Patient's chart reviewed by Norleen Schillings CNRA prior to pre-visit and patient appropriate for the [facility].  Pre-visit completed and red dot placed by patient's name on their procedure day (on provider's schedule).   Visit in person  Pt states weight is 128 lb  Pt given  both [facility] main # and MD on call # prior to instructions.  Informed pt to come in at the time discussed and is shown on PV instructions.  Pt instructed to use Singlecare.com or GoodRx for a price reduction on prep  Instructed pt where to find PV instructions in My Chart and a copy given to pt. Instructed pt on all aspects of written instructions including med holds clothing to wear and foods to eat and not eat as well as after procedure legal restrictions and to call MD on call if needed.. Pt states understanding. Instructed pt to review instructions again prior to procedure and call main # given if has any questions or any issues. Pt states they will.

## 2024-10-10 ENCOUNTER — Other Ambulatory Visit (HOSPITAL_COMMUNITY): Payer: Self-pay

## 2024-10-10 ENCOUNTER — Telehealth (INDEPENDENT_AMBULATORY_CARE_PROVIDER_SITE_OTHER): Payer: Self-pay | Admitting: Primary Care

## 2024-10-10 NOTE — Telephone Encounter (Signed)
 Copied from CRM #[id number]. Topic: General - Other >> Oct 10, 2024  9:27 AM Antony RAMAN wrote: Reason for CRM: pt requesting michelle edwards to call [employer] provider line to sign up for clear pricing project so they will pay for patients co pays, its for state retirees   Cb- [phone]

## 2024-10-11 ENCOUNTER — Other Ambulatory Visit (HOSPITAL_COMMUNITY): Payer: Self-pay

## 2024-10-20 ENCOUNTER — Encounter: Payer: Self-pay | Admitting: Internal Medicine

## 2024-10-21 ENCOUNTER — Telehealth: Payer: Self-pay | Admitting: Physician Assistant

## 2024-10-21 NOTE — Telephone Encounter (Signed)
 Patient called this afternoon stating she has had a cough and some congestion.  She is worried that the coughing may interfere with her sedation etc. because she has been coughing hard at times, no fever chills etc.  She would like to reschedule a colonoscopy which is being done for follow-up of polyps.   I advised we would cancel the colonoscopy Nurses--least reach out to her next week to get her rescheduled for colonoscopy with Dr. Federico thank you

## 2024-10-23 NOTE — Telephone Encounter (Signed)
 Spoke with patient who wishes to reschedule colonoscopy.  She has  other appointments but she couldn't remember dates of those. Advised patient to call back when she has her list of appointments available and we will be happy to work with her schedule to get her a colonoscopy appointment.  Patient stated she will call back to schedule.

## 2024-10-24 ENCOUNTER — Encounter: Admitting: Internal Medicine

## 2024-11-14 ENCOUNTER — Other Ambulatory Visit (HOSPITAL_COMMUNITY): Payer: Self-pay

## 2024-11-14 ENCOUNTER — Ambulatory Visit (HOSPITAL_COMMUNITY)
Admission: EM | Admit: 2024-11-14 | Discharge: 2024-11-14 | Disposition: A | Discharge status: Home / Self Care | Attending: Emergency Medicine | Admitting: Emergency Medicine

## 2024-11-14 ENCOUNTER — Encounter (HOSPITAL_COMMUNITY): Payer: Self-pay

## 2024-11-14 DIAGNOSIS — R059 Cough, unspecified: Secondary | ICD-10-CM | POA: Diagnosis not present

## 2024-11-14 MED ORDER — BENZONATATE 100 MG PO CAPS
100.0000 mg | ORAL_CAPSULE | Freq: Three times a day (TID) | ORAL | 0 refills | Status: AC
  Filled 2024-11-14: qty 30, 10d supply, fill #0

## 2024-11-14 MED ORDER — AZITHROMYCIN 250 MG PO TABS
ORAL_TABLET | ORAL | 0 refills | Status: AC
  Filled 2024-11-14: qty 6, 5d supply, fill #0

## 2024-11-14 NOTE — ED Provider Notes (Signed)
 " [facility]    CSN: [id number] Arrival date & time: 11/14/24  1302      History   Chief Complaint Chief Complaint  Patient presents with   Cough    HPI Amanda Shepherd is a 74 y.o. female.   Patient presents to clinic over concern of a cough that has been present for the past 3 weeks or so  Coughing for 3 weeks  If she is lying down or being still she is not coughing as much  When she is up and active the cough comes  Has been taking Coricidin and switched to Mucinex   Cough is not as active as it was  If she goes outside this makes her coughing worse Church on Saturday, she was outside and trees line her street  Has seasonal allergies about every other year  Reports she is UTD on her vaccines, thinks she got her but pneumonia and flu vaccines Denies SOB or trouble breathing, sometimes cough is productive but it is clear  Does have a history of type 2 diabetes, A1c a year ago was 8.8  The history is provided by the patient and medical records.  Cough   Past Medical History:  Diagnosis Date   Bronchitis    Cancer (HCC)    L breast CA   Chronic kidney disease    Cough in adult 10/27/2013   Diabetes mellitus    Hyperlipidemia    Hypertension     Patient Active Problem List   Diagnosis Date Noted   Ductal carcinoma in situ (DCIS) of left breast 03/16/2023   Seasonal allergies 02/04/2023   Osteoporosis 06/01/2022   CKD (chronic kidney disease), stage III (HCC) 03/03/2021   Genetic testing 07/08/2017   Mild non proliferative diabetic retinopathy (HCC) 06/04/2014   Primary hypertension 09/22/2010   Type 2 diabetes mellitus (HCC) 02/14/2007   Hyperlipidemia associated with type 2 diabetes mellitus (HCC) 02/14/2007    Past Surgical History:  Procedure Laterality Date   ABDOMINAL HYSTERECTOMY     BREAST BIOPSY Left 03/03/2023   MM LT BREAST BX W LOC DEV 1ST LESION IMAGE BX SPEC STEREO GUIDE 03/03/2023 [facility]   BREAST BIOPSY Right 03/03/2023    MM RT BREAST BX W LOC DEV 1ST LESION IMAGE BX SPEC STEREO GUIDE 03/03/2023 [facility]   BREAST BIOPSY Right 03/12/2023   MM RT BREAST BX W LOC DEV 1ST LESION IMAGE BX SPEC STEREO GUIDE 03/12/2023 [facility]   BREAST BIOPSY  04/20/2023   MM LT RADIOACTIVE SEED LOC MAMMO GUIDE 04/20/2023 [facility]   BREAST BIOPSY  04/20/2023   MM RT RADIOACTIVE SEED LOC MAMMO GUIDE 04/20/2023 [facility]   BREAST BIOPSY  04/20/2023   MM RT RADIOACTIVE SEED EA ADD LESION LOC MAMMO GUIDE 04/20/2023 [facility]   BREAST LUMPECTOMY WITH RADIOACTIVE SEED LOCALIZATION Bilateral 04/21/2023   Procedure: BILATERAL BREAST LUMPECTOMY WITH RADIOACTIVE SEED LOCALIZATION;  Surgeon: Curvin Deward MOULD, MD;  Location: [facility];  Service: General;  Laterality: Bilateral;   COLONOSCOPY  07/15/2005   at [facility]-normal    OB History   No obstetric history on file.      Home Medications    Prior to Admission medications  Medication Sig Start Date End Date Taking? Authorizing Provider  azithromycin  (ZITHROMAX ) 250 MG tablet Take 2 tablets (500 mg total) by mouth daily for 1 day, THEN 1 tablet (250 mg total) daily for 4 days. 11/14/24 11/19/24 Yes Ball, Sahaj Bona  G, FNP  benzonatate  (  TESSALON ) 100 MG capsule Take 1 capsule (100 mg total) by mouth every 8 (eight) hours. 11/14/24  Yes Ball, Reef Achterberg  G, FNP  alendronate  (FOSAMAX ) 70 MG tablet Take 1 tablet (70 mg total) by mouth once a week in the morning. 07/31/24     atorvastatin  (LIPITOR) 10 MG tablet Take 1 tablet by mouth daily at bedtime Patient not taking: Reported on 10/09/2024 03/17/24     azelastine  (ASTELIN ) 0.1 % nasal spray Place 1 spray into both nostrils 2 (two) times daily. Use in each nostril as directed 02/04/23   Jennelle Riis, MD  blood glucose meter kit and supplies KIT Use up to four times daily as directed. 10/08/23   Nicholas Bar, MD  Blood Glucose Monitoring Suppl (ONE TOUCH ULTRA 2) w/Device KIT USE TO CHECK GLUCOSE BEFORE  MEAL(S) AND  AT BEDTIME 03/17/22   Malvina Ellen, MD  Calcium  Carb-Cholecalciferol  (CALCIUM  + VITAMIN D3) 500-5 MG-MCG TABS Take 1 tablet by mouth daily with breakfast. 07/24/24     calcium -vitamin D (OSCAL WITH D) 500-5 MG-MCG tablet Take 1 tablet by mouth daily with breakfast. Patient not taking: Reported on 10/09/2024 07/15/22   Malvina Ellen, MD  cetirizine  (ZYRTEC ) 10 MG tablet Take 1 tablet (10 mg total) by mouth daily as needed for allergies. 02/04/23   Jennelle Riis, MD  dapagliflozin  propanediol (FARXIGA ) 10 MG TABS tablet Take 1 tablet (10 mg total) by mouth daily. 09/08/23   Nicholas Bar, MD  dapagliflozin  propanediol (FARXIGA ) 10 MG TABS tablet Take 1 tablet (10 mg total) by mouth every morning. 07/24/24     dapagliflozin  propanediol (FARXIGA ) 10 MG TABS tablet Take 1 tablet (10 mg total) by mouth every morning. 07/31/24     Dulaglutide  (TRULICITY ) 3 MG/0.5ML SOAJ Inject 3 mg into the skin once a week. Patient not taking: Reported on 10/09/2024 12/03/23   Tharon Lung, MD  Ferrous Sulfate 5 MG/20ML SOLN Take 10 mg by mouth. Patient not taking: Reported on 10/09/2024    [provider]  fluticasone  (FLONASE ) 50 MCG/ACT nasal spray Place 1 spray into both nostrils daily. Patient taking differently: Place 1 spray into both nostrils as needed. 02/01/23   McDiarmid, Krystal BIRCH, MD  glucose blood (ONETOUCH ULTRA) test strip Use to check blood sugar up to 4 times daily 04/22/22   Malvina Ellen, MD  glucose blood test strip Use to test blood sugar levels four times daily as directed 10/08/23     Lancets (FREESTYLE) lancets Use to test blood sugar levels up to four times daily as directed 10/08/23     Lancets (ONETOUCH DELICA PLUS LANCET33G) MISC USE TO CHECK BLOOD SUGARS FOUR TIMES DAILY. 03/17/22   Malvina Ellen, MD  losartan  (COZAAR ) 50 MG tablet Take 1 tablet (50 mg total) by mouth at bedtime. 10/21/23   Tharon Lung, MD  losartan  (COZAAR ) 50 MG tablet Take 1 tablet (50 mg total) by  mouth daily. 08/10/24     metFORMIN  (GLUCOPHAGE ) 500 MG tablet Take 1 tablet (500 mg total) by mouth 2 (two) times daily with morning and evening meals. Patient not taking: Reported on 10/09/2024 07/24/24     metFORMIN  (GLUCOPHAGE ) 500 MG tablet Take 1 tablet (500 mg total) by mouth 2 (two) times daily with morning and evening meals. Patient not taking: Reported on 10/09/2024 07/31/24     metFORMIN  (GLUCOPHAGE -XR) 500 MG 24 hr tablet Take 2 tablets (1,000 mg total) by mouth 2 (two) times daily with a meal. 10/08/23   Nicholas Bar, MD  Multiple Vitamin (  MULTIVITAMIN) tablet Take 1 tablet by mouth daily. Centrum Silver    [provider]  rosuvastatin  (CRESTOR ) 20 MG tablet Take 1 tablet (20 mg total) by mouth daily. Patient not taking: Reported on 10/09/2024 09/08/23   Nicholas Bar, MD  rosuvastatin  (CRESTOR ) 20 MG tablet Take 1 tablet by mouth daily at bedtime Patient not taking: Reported on 10/09/2024 07/24/24     rosuvastatin  (CRESTOR ) 20 MG tablet Take 1 tablet (20 mg total) by mouth at bedtime. Patient not taking: Reported on 10/09/2024 08/10/24     saxagliptin  HCl (ONGLYZA) 5 MG TABS tablet Take 1 tablet (5 mg total) by mouth daily. 08/10/24       Family History Family History  Problem Relation Age of Onset   Hypertension Mother    Breast cancer Paternal Aunt    Thyroid cancer Son        dx 105s   Breast cancer Paternal Uncle    Breast cancer Other    Colon cancer Neg Hx    Colon polyps Neg Hx    Esophageal cancer Neg Hx    Rectal cancer Neg Hx    Stomach cancer Neg Hx     Social History Social History[1]   Allergies   Atorvastatin    Review of Systems Review of Systems  Per HPI  Physical Exam Triage Vital Signs ED Triage Vitals  Encounter Vitals Group     BP 11/14/24 1513 (!) 159/76     Girls Systolic BP Percentile --      Girls Diastolic BP Percentile --      Boys Systolic BP Percentile --      Boys Diastolic BP Percentile --      Pulse Rate  11/14/24 1513 80     Resp 11/14/24 1513 15     Temp 11/14/24 1513 98 F (36.7 C)     Temp Source 11/14/24 1513 Oral     SpO2 11/14/24 1513 97 %     Weight --      Height --      Head Circumference --      Peak Flow --      Pain Score 11/14/24 1512 0     Pain Loc --      Pain Education --      Exclude from Growth Chart --    No data found.  Updated Vital Signs BP (!) 159/76 (BP Location: Left Arm)   Pulse 80   Temp 98 F (36.7 C) (Oral)   Resp 15   SpO2 97%   Visual Acuity Right Eye Distance:   Left Eye Distance:   Bilateral Distance:    Right Eye Near:   Left Eye Near:    Bilateral Near:     Physical Exam Vitals and nursing note reviewed.  Constitutional:      Appearance: Normal appearance.  HENT:     Head: Normocephalic and atraumatic.     Right Ear: External ear normal.     Left Ear: External ear normal.     Nose: Nose normal.     Mouth/Throat:     Mouth: Mucous membranes are moist.  Eyes:     Conjunctiva/sclera: Conjunctivae normal.  Cardiovascular:     Rate and Rhythm: Normal rate and regular rhythm.     Heart sounds: Normal heart sounds. No murmur heard. Pulmonary:     Effort: Pulmonary effort is normal. No respiratory distress.     Breath sounds: Normal breath sounds. No wheezing.  Skin:  General: Skin is warm and dry.  Neurological:     General: No focal deficit present.     Mental Status: She is alert.  Psychiatric:        Mood and Affect: Mood normal.      UC Treatments / Results  Labs (all labs ordered are listed, but only abnormal results are displayed) Labs Reviewed - No data to display  EKG   Radiology No results found.  Procedures Procedures (including critical care time)  Medications Ordered in UC Medications - No data to display  Initial Impression / Assessment and Plan / UC Course  I have reviewed the triage vital signs and the nursing notes.  Pertinent labs & imaging results that were available during my care of  the patient were reviewed by me and considered in my medical decision making (see chart for details).  Vitals and triage reviewed, patient is hemodynamically stable.  Lungs vesicular, heart with regular rate and rhythm.  Oxygenation 97% on room air.  Without fatigue, weakness, shortness of breath or fever.  Without clinical evidence of pneumonia, chest x-ray deferred.  Suspect bronchitis.  Will trial Z-Pak due to prolonged duration of cough.  Will withhold steroids due to type 2 diabetes.  Symptomatic management of cough discussed.  Plan of care, follow-up care, and return precautions given, no questions at this time.    Final Clinical Impressions(s) / UC Diagnoses   Final diagnoses:  Cough, unspecified type     Discharge Instructions      Take the antibiotics as prescribed with food for the next 5 days You can use the cough medicine every 8 hours as needed, this should not impact your blood pressure Sleeping with a humidifier may help further loosen any secretions in addition to at least 64 ounces of water daily  If no improvement or any changes such as fever, or trouble breathing follow-up with your primary care provider or return to clinic for reevaluation      ED Prescriptions     Medication Sig Dispense Auth. Provider   azithromycin  (ZITHROMAX ) 250 MG tablet Take 2 tablets (500 mg total) by mouth daily for 1 day, THEN 1 tablet (250 mg total) daily for 4 days. 6 tablet Ball, Clancy Leiner  G, FNP   benzonatate  (TESSALON ) 100 MG capsule Take 1 capsule (100 mg total) by mouth every 8 (eight) hours. 30 capsule Ball, Kimberly Coye  G, FNP      PDMP not reviewed this encounter.     [1]  Social History Tobacco Use   Smoking status: Never   Smokeless tobacco: Never  Vaping Use   Vaping status: Never Used  Substance Use Topics   Alcohol use: No   Drug use: No     Mercer, Evalise Abruzzese  G, FNP 11/14/24 1550  "

## 2024-11-14 NOTE — ED Triage Notes (Signed)
 Pt present with c/o a constant cough x 3 weeks. Pt has taken robitussin. Pt denies any pain when coughing.

## 2024-11-14 NOTE — Discharge Instructions (Signed)
 Take the antibiotics as prescribed with food for the next 5 days You can use the cough medicine every 8 hours as needed, this should not impact your blood pressure Sleeping with a humidifier may help further loosen any secretions in addition to at least 64 ounces of water daily  If no improvement or any changes such as fever, or trouble breathing follow-up with your primary care provider or return to clinic for reevaluation

## 2024-11-22 ENCOUNTER — Telehealth (INDEPENDENT_AMBULATORY_CARE_PROVIDER_SITE_OTHER): Payer: Self-pay | Admitting: Primary Care

## 2024-11-22 NOTE — Telephone Encounter (Signed)
 Spoke to pt about upcoming appt.. Will be present

## 2024-11-23 ENCOUNTER — Ambulatory Visit (INDEPENDENT_AMBULATORY_CARE_PROVIDER_SITE_OTHER): Payer: Self-pay | Admitting: Primary Care

## 2024-11-23 ENCOUNTER — Encounter (INDEPENDENT_AMBULATORY_CARE_PROVIDER_SITE_OTHER): Payer: Self-pay | Admitting: Primary Care

## 2024-11-23 ENCOUNTER — Other Ambulatory Visit: Payer: Self-pay

## 2024-11-23 ENCOUNTER — Telehealth (INDEPENDENT_AMBULATORY_CARE_PROVIDER_SITE_OTHER): Payer: Self-pay | Admitting: Primary Care

## 2024-11-23 VITALS — BP 142/76 | HR 78 | Temp 97.8°F | Resp 16 | Ht 66.0 in | Wt 132.8 lb

## 2024-11-23 DIAGNOSIS — E1169 Type 2 diabetes mellitus with other specified complication: Secondary | ICD-10-CM

## 2024-11-23 DIAGNOSIS — I10 Essential (primary) hypertension: Secondary | ICD-10-CM

## 2024-11-23 DIAGNOSIS — R413 Other amnesia: Secondary | ICD-10-CM

## 2024-11-23 DIAGNOSIS — E113299 Type 2 diabetes mellitus with mild nonproliferative diabetic retinopathy without macular edema, unspecified eye: Secondary | ICD-10-CM

## 2024-11-23 DIAGNOSIS — N1832 Chronic kidney disease, stage 3b: Secondary | ICD-10-CM

## 2024-11-23 DIAGNOSIS — M810 Age-related osteoporosis without current pathological fracture: Secondary | ICD-10-CM

## 2024-11-23 NOTE — Telephone Encounter (Unsigned)
 Copied from CRM [id number]. Topic: Clinical - Medication Question >> Nov 23, 2024  3:01 PM Amanda Shepherd wrote: Reason for CRM: Pt would like to [facility] sure the new medicaiton is sent to the [facility].   In her afterwork shows another pharmacy tourist information centre manager)   Correct pharmacy  [facility] [address], [address], [place] [address] Phone: [phone]  Fax: [phone]

## 2024-11-23 NOTE — Progress Notes (Unsigned)
 "  New Patient Office Visit  Subjective    Patient ID: Amanda Shepherd, female    DOB: October 02, 1951  Age: 74 y.o. MRN: [id number]  CC:  Chief Complaint  Patient presents with   New Patient (Initial Visit)    Establish care   Dm  HTN  Medication management     HPI Amanda Shepherd presents to establish care. Htn- denies headaches, chest pain , shortness of breath or new weakness.  T2D-denies polyuria, polydipsia, poly phagia or vision changes. Requesting medication refills. She has a hx of breast cancer takes mammogram yearly.  Question getting previous records.  Records are at [facility] [address] and wish not to comment on not wanting to return.  Concerned with memory loss over a year  Day 12 February, season winter , Amanda Shepherd, year 2026. Draw a clock time 2pm-obscured,unable to recall 3 words   Outpatient Encounter Medications as of 11/23/2024  Medication Sig   alendronate  (FOSAMAX ) 70 MG tablet Take 1 tablet (70 mg total) by mouth once a week in the morning.   atorvastatin  (LIPITOR) 10 MG tablet Take 1 tablet by mouth daily at bedtime (Patient not taking: Reported on 10/09/2024)   azelastine  (ASTELIN ) 0.1 % nasal spray Place 1 spray into both nostrils 2 (two) times daily. Use in each nostril as directed   benzonatate  (TESSALON ) 100 MG capsule Take 1 capsule (100 mg total) by mouth every 8 (eight) hours.   Calcium  Carb-Cholecalciferol  (CALCIUM  + VITAMIN D3) 500-5 MG-MCG TABS Take 1 tablet by mouth daily with breakfast.   cetirizine  (ZYRTEC ) 10 MG tablet Take 1 tablet (10 mg total) by mouth daily as needed for allergies.   dapagliflozin  propanediol (FARXIGA ) 10 MG TABS tablet Take 1 tablet (10 mg total) by mouth daily.   Dulaglutide  (TRULICITY ) 3 MG/0.5ML SOAJ Inject 3 mg into the skin once a week. (Patient not taking: Reported on 10/09/2024)   metFORMIN  (GLUCOPHAGE -XR) 500 MG 24 hr tablet Take 2 tablets (1,000 mg total) by mouth 2 (two) times daily with a meal.   Multiple Vitamin  (MULTIVITAMIN) tablet Take 1 tablet by mouth daily. Centrum Silver   saxagliptin  HCl (ONGLYZA) 5 MG TABS tablet Take 1 tablet (5 mg total) by mouth daily.   [DISCONTINUED] blood glucose meter kit and supplies KIT Use up to four times daily as directed.   [DISCONTINUED] Blood Glucose Monitoring Suppl (ONE TOUCH ULTRA 2) w/Device KIT USE TO CHECK GLUCOSE BEFORE MEAL(S) AND  AT BEDTIME   [DISCONTINUED] calcium -vitamin D (OSCAL WITH D) 500-5 MG-MCG tablet Take 1 tablet by mouth daily with breakfast. (Patient not taking: Reported on 10/09/2024)   [DISCONTINUED] dapagliflozin  propanediol (FARXIGA ) 10 MG TABS tablet Take 1 tablet (10 mg total) by mouth every morning.   [DISCONTINUED] dapagliflozin  propanediol (FARXIGA ) 10 MG TABS tablet Take 1 tablet (10 mg total) by mouth every morning.   [DISCONTINUED] Ferrous Sulfate 5 MG/20ML SOLN Take 10 mg by mouth. (Patient not taking: Reported on 10/09/2024)   [DISCONTINUED] fluticasone  (FLONASE ) 50 MCG/ACT nasal spray Place 1 spray into both nostrils daily. (Patient taking differently: Place 1 spray into both nostrils as needed.)   [DISCONTINUED] glucose blood (ONETOUCH ULTRA) test strip Use to check blood sugar up to 4 times daily   [DISCONTINUED] glucose blood test strip Use to test blood sugar levels four times daily as directed   [DISCONTINUED] Lancets (FREESTYLE) lancets Use to test blood sugar levels up to four times daily as directed   [DISCONTINUED] Lancets (ONETOUCH DELICA PLUS LANCET33G) MISC  USE TO CHECK BLOOD SUGARS FOUR TIMES DAILY.   [DISCONTINUED] losartan  (COZAAR ) 50 MG tablet Take 1 tablet (50 mg total) by mouth at bedtime.   [DISCONTINUED] losartan  (COZAAR ) 50 MG tablet Take 1 tablet (50 mg total) by mouth daily.   [DISCONTINUED] metFORMIN  (GLUCOPHAGE ) 500 MG tablet Take 1 tablet (500 mg total) by mouth 2 (two) times daily with morning and evening meals. (Patient not taking: Reported on 10/09/2024)   [DISCONTINUED] metFORMIN  (GLUCOPHAGE ) 500 MG  tablet Take 1 tablet (500 mg total) by mouth 2 (two) times daily with morning and evening meals. (Patient not taking: Reported on 10/09/2024)   [DISCONTINUED] rosuvastatin  (CRESTOR ) 20 MG tablet Take 1 tablet (20 mg total) by mouth daily. (Patient not taking: Reported on 10/09/2024)   [DISCONTINUED] rosuvastatin  (CRESTOR ) 20 MG tablet Take 1 tablet by mouth daily at bedtime (Patient not taking: Reported on 10/09/2024)   [DISCONTINUED] rosuvastatin  (CRESTOR ) 20 MG tablet Take 1 tablet (20 mg total) by mouth at bedtime. (Patient not taking: Reported on 10/09/2024)   No facility-administered encounter medications on file as of 11/23/2024.    Past Medical History:  Diagnosis Date   Bronchitis    Cancer (HCC)    L breast CA   Chronic kidney disease    Cough in adult 10/27/2013   Diabetes mellitus    Hyperlipidemia    Hypertension     Past Surgical History:  Procedure Laterality Date   ABDOMINAL HYSTERECTOMY     BREAST BIOPSY Left 03/03/2023   MM LT BREAST BX W LOC DEV 1ST LESION IMAGE BX SPEC STEREO GUIDE 03/03/2023 [facility]   BREAST BIOPSY Right 03/03/2023   MM RT BREAST BX W LOC DEV 1ST LESION IMAGE BX SPEC STEREO GUIDE 03/03/2023 [facility]   BREAST BIOPSY Right 03/12/2023   MM RT BREAST BX W LOC DEV 1ST LESION IMAGE BX SPEC STEREO GUIDE 03/12/2023 [facility]   BREAST BIOPSY  04/20/2023   MM LT RADIOACTIVE SEED LOC MAMMO GUIDE 04/20/2023 [facility]   BREAST BIOPSY  04/20/2023   MM RT RADIOACTIVE SEED LOC MAMMO GUIDE 04/20/2023 [facility]   BREAST BIOPSY  04/20/2023   MM RT RADIOACTIVE SEED EA ADD LESION LOC MAMMO GUIDE 04/20/2023 [facility]   BREAST LUMPECTOMY WITH RADIOACTIVE SEED LOCALIZATION Bilateral 04/21/2023   Procedure: BILATERAL BREAST LUMPECTOMY WITH RADIOACTIVE SEED LOCALIZATION;  Surgeon: Curvin Deward MOULD, MD;  Location: [facility];  Service: General;  Laterality: Bilateral;   COLONOSCOPY  07/15/2005   at [facility]-normal     Family History  Problem Relation Age of Onset   Hypertension Mother    Breast cancer Paternal Aunt    Thyroid cancer Son        dx 84s   Breast cancer Paternal Uncle    Breast cancer Other    Colon cancer Neg Hx    Colon polyps Neg Hx    Esophageal cancer Neg Hx    Rectal cancer Neg Hx    Stomach cancer Neg Hx     Social History   Socioeconomic History   Marital status: Single    Spouse name: Not on file   Number of children: 2   Years of education: Not on file   Highest education level: Not on file  Occupational History   Occupation: Retired  Tobacco Use   Smoking status: Never   Smokeless tobacco: Never  Vaping Use   Vaping status: Never Used  Substance and Sexual Activity   Alcohol use: No  Drug use: No   Sexual activity: Never  Other Topics Concern   Not on file  Social History Narrative   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    Social Drivers of Health   Tobacco Use: Low Risk (11/23/2024)   Patient History    Smoking Tobacco Use: Never    Smokeless Tobacco Use: Never    Passive Exposure: Not on file  Financial Resource Strain: Low Risk (01/07/2023)   Overall Financial Resource Strain (CARDIA)    Difficulty of Paying Living Expenses: Not hard at all  Food Insecurity: Food Insecurity Present (11/23/2024)   ACO Reach    Worried About Running Out of Food in the Last Year: Yes    Ran Out of Food in the Last Year: Yes  Transportation Needs: No Transportation Needs (11/23/2024)   ACO Reach    Lack of Transportation: No  Physical Activity: Inactive (01/07/2023)   Exercise Vital Sign    Days of Exercise per Week: 0 days    Minutes of Exercise per Session: 0 min  Stress: No Stress Concern Present (01/07/2023)   [employer] of Occupational Health - Occupational Stress Questionnaire    Feeling of Stress : Not at all  Social Connections: Moderately Isolated (01/07/2023)   Social  Connection and Isolation Panel    Frequency of Communication with Friends and Family: More than three times a week    Frequency of Social Gatherings with Friends and Family: Once a week    Attends Religious Services: More than 4 times per year    Active Member of [employer] or Organizations: No    Attends Banker Meetings: Never    Marital Status: Divorced  Catering Manager Violence: At Risk (11/23/2024)   ACO Reach    Feels Physically and Emotionally Safe: No    Physically Hurt by Someone: No    Humiliated or Emotionally Abused by Someone: No  Depression (PHQ2-9): Low Risk (11/23/2024)   Depression (PHQ2-9)    PHQ-2 Score: 0  Alcohol Screen: Low Risk (01/07/2023)   Alcohol Screen    Last Alcohol Screening Score (AUDIT): 0  Housing: At Risk (11/23/2024)   ACO Reach    Has Housing: No    Worried About Losing Housing: No    Unable to Get Utilities: No  Utilities: At Risk (11/23/2024)   ACO Reach    Has Housing: No    Worried About Losing Housing: No    Unable to Get Utilities: No  Health Literacy: Not on file    ROS      Objective    BP (!) 142/76   Pulse 78   Temp 97.8 F (36.6 C)   Resp 16   Ht 5' 6 (1.676 m)   Wt 132 lb 12.8 oz (60.2 kg)   SpO2 99%   BMI 21.43 kg/m   Physical Exam  {Labs (Optional):23779}    Assessment & Plan:   Problem List Items Addressed This Visit   None   No follow-ups on file.   Juvenal JONELLE Marina, RMA   "

## 2024-11-24 LAB — CMP14+EGFR
ALT: 23 [IU]/L (ref 0–32)
AST: 33 [IU]/L (ref 0–40)
Albumin: 4.5 g/dL (ref 3.8–4.8)
Alkaline Phosphatase: 167 [IU]/L — ABNORMAL HIGH (ref 49–135)
BUN/Creatinine Ratio: 18 (ref 12–28)
BUN: 24 mg/dL (ref 8–27)
Bilirubin Total: 0.5 mg/dL (ref 0.0–1.2)
CO2: 22 mmol/L (ref 20–29)
Calcium: 10.6 mg/dL — ABNORMAL HIGH (ref 8.7–10.3)
Chloride: 98 mmol/L (ref 96–106)
Creatinine, Ser: 1.32 mg/dL — ABNORMAL HIGH (ref 0.57–1.00)
Globulin, Total: 3.9 g/dL (ref 1.5–4.5)
Glucose: 276 mg/dL — ABNORMAL HIGH (ref 70–99)
Potassium: 4.5 mmol/L (ref 3.5–5.2)
Sodium: 137 mmol/L (ref 134–144)
Total Protein: 8.4 g/dL (ref 6.0–8.5)
eGFR: 43 mL/min/{1.73_m2} — ABNORMAL LOW

## 2024-11-24 LAB — CBC WITH DIFFERENTIAL/PLATELET
Basophils Absolute: 0.1 10*3/uL (ref 0.0–0.2)
Basos: 1 %
EOS (ABSOLUTE): 0.1 10*3/uL (ref 0.0–0.4)
Eos: 2 %
Hematocrit: 43.1 % (ref 34.0–46.6)
Hemoglobin: 13.8 g/dL (ref 11.1–15.9)
Immature Grans (Abs): 0 10*3/uL (ref 0.0–0.1)
Immature Granulocytes: 0 %
Lymphocytes Absolute: 1.8 10*3/uL (ref 0.7–3.1)
Lymphs: 25 %
MCH: 30.6 pg (ref 26.6–33.0)
MCHC: 32 g/dL (ref 31.5–35.7)
MCV: 96 fL (ref 79–97)
Monocytes Absolute: 0.4 10*3/uL (ref 0.1–0.9)
Monocytes: 6 %
Neutrophils Absolute: 4.7 10*3/uL (ref 1.4–7.0)
Neutrophils: 66 %
Platelets: 439 10*3/uL (ref 150–450)
RBC: 4.51 x10E6/uL (ref 3.77–5.28)
RDW: 11.7 % (ref 11.7–15.4)
WBC: 7.1 10*3/uL (ref 3.4–10.8)

## 2024-11-24 LAB — MICROALBUMIN / CREATININE URINE RATIO
Creatinine, Urine: 49.6 mg/dL
Microalb/Creat Ratio: 44 mg/g{creat} — ABNORMAL HIGH (ref 0–29)
Microalbumin, Urine: 21.9 ug/mL

## 2024-11-24 LAB — LIPID PANEL
Chol/HDL Ratio: 3.6 ratio (ref 0.0–4.4)
Cholesterol, Total: 270 mg/dL — ABNORMAL HIGH (ref 100–199)
HDL: 75 mg/dL
LDL Chol Calc (NIH): 168 mg/dL — ABNORMAL HIGH (ref 0–99)
Triglycerides: 151 mg/dL — ABNORMAL HIGH (ref 0–149)
VLDL Cholesterol Cal: 27 mg/dL (ref 5–40)

## 2024-11-24 LAB — HEMOGLOBIN A1C
Est. average glucose Bld gHb Est-mCnc: 326 mg/dL
Hgb A1c MFr Bld: 13 % — ABNORMAL HIGH (ref 4.8–5.6)

## 2024-12-04 ENCOUNTER — Encounter: Admitting: Internal Medicine

## 2025-02-08 ENCOUNTER — Inpatient Hospital Stay

## 2025-02-08 ENCOUNTER — Encounter: Admitting: Adult Health

## 2025-02-20 ENCOUNTER — Ambulatory Visit (INDEPENDENT_AMBULATORY_CARE_PROVIDER_SITE_OTHER): Payer: Self-pay | Admitting: Primary Care
